# Patient Record
Sex: Male | Born: 1940
Health system: Southern US, Community
[De-identification: ages and names within clinical notes are randomized; demographics above are authoritative.]

## PROBLEM LIST (undated history)

## (undated) DIAGNOSIS — N390 Urinary tract infection, site not specified: Secondary | ICD-10-CM

## (undated) DIAGNOSIS — E669 Obesity, unspecified: Secondary | ICD-10-CM

## (undated) DIAGNOSIS — G20 Parkinson's disease: Secondary | ICD-10-CM

## (undated) DIAGNOSIS — G20A1 Parkinson's disease without dyskinesia, without mention of fluctuations: Secondary | ICD-10-CM

## (undated) DIAGNOSIS — E039 Hypothyroidism, unspecified: Secondary | ICD-10-CM

## (undated) DIAGNOSIS — I878 Other specified disorders of veins: Secondary | ICD-10-CM

## (undated) DIAGNOSIS — W19XXXA Unspecified fall, initial encounter: Secondary | ICD-10-CM

## (undated) HISTORY — DX: Hypothyroidism, unspecified: E03.9

## (undated) HISTORY — DX: Morbid (severe) obesity due to excess calories: E66.01

## (undated) HISTORY — PX: CERVICAL LAMINECTOMY: SHX94

## (undated) HISTORY — DX: Other specified disorders of veins: I87.8

## (undated) HISTORY — DX: Urinary tract infection, site not specified: N39.0

## (undated) HISTORY — PX: OTHER SURGICAL HISTORY: SHX169

## (undated) HISTORY — DX: Parkinson's disease: G20

## (undated) HISTORY — PX: APPENDECTOMY: SHX54

## (undated) HISTORY — DX: Obesity, unspecified: E66.9

## (undated) HISTORY — DX: Parkinson's disease without dyskinesia, without mention of fluctuations: G20.A1

---

## 1990-02-17 HISTORY — PX: FOOT SURGERY: SHX648

## 2002-01-21 ENCOUNTER — Ambulatory Visit (HOSPITAL_COMMUNITY): Admission: RE | Admit: 2002-01-21 | Discharge: 2002-01-21 | Payer: Self-pay | Admitting: Neurology

## 2002-01-21 ENCOUNTER — Encounter: Admission: RE | Admit: 2002-01-21 | Discharge: 2002-01-21 | Payer: Self-pay | Admitting: Neurology

## 2002-01-21 ENCOUNTER — Encounter: Payer: Self-pay | Admitting: Neurology

## 2002-02-03 ENCOUNTER — Encounter: Payer: Self-pay | Admitting: Neurology

## 2002-02-03 ENCOUNTER — Encounter: Admission: RE | Admit: 2002-02-03 | Discharge: 2002-02-03 | Payer: Self-pay | Admitting: Neurology

## 2004-02-20 ENCOUNTER — Encounter: Admission: RE | Admit: 2004-02-20 | Discharge: 2004-02-20 | Payer: Self-pay | Admitting: Sports Medicine

## 2004-03-11 ENCOUNTER — Encounter: Admission: RE | Admit: 2004-03-11 | Discharge: 2004-03-11 | Payer: Self-pay | Admitting: Sports Medicine

## 2004-03-28 ENCOUNTER — Encounter: Admission: RE | Admit: 2004-03-28 | Discharge: 2004-03-28 | Payer: Self-pay | Admitting: Sports Medicine

## 2004-12-14 ENCOUNTER — Inpatient Hospital Stay (HOSPITAL_COMMUNITY): Admission: EM | Admit: 2004-12-14 | Discharge: 2004-12-18 | Payer: Self-pay | Admitting: Emergency Medicine

## 2005-04-25 ENCOUNTER — Encounter: Admission: RE | Admit: 2005-04-25 | Discharge: 2005-04-25 | Payer: Self-pay | Admitting: Internal Medicine

## 2006-10-26 ENCOUNTER — Encounter: Admission: RE | Admit: 2006-10-26 | Discharge: 2006-10-26 | Payer: Self-pay | Admitting: Internal Medicine

## 2008-02-08 ENCOUNTER — Encounter: Admission: RE | Admit: 2008-02-08 | Discharge: 2008-02-08 | Payer: Self-pay | Admitting: Neurology

## 2008-11-06 ENCOUNTER — Encounter: Admission: RE | Admit: 2008-11-06 | Discharge: 2009-01-31 | Payer: Self-pay | Admitting: Neurology

## 2009-01-29 ENCOUNTER — Ambulatory Visit: Payer: Self-pay | Admitting: Family Medicine

## 2009-01-29 DIAGNOSIS — G20 Parkinson's disease: Secondary | ICD-10-CM

## 2009-05-03 ENCOUNTER — Ambulatory Visit: Payer: Self-pay | Admitting: Family Medicine

## 2009-05-03 DIAGNOSIS — H6120 Impacted cerumen, unspecified ear: Secondary | ICD-10-CM

## 2009-05-15 ENCOUNTER — Ambulatory Visit: Payer: Self-pay | Admitting: Family Medicine

## 2009-05-16 LAB — CONVERTED CEMR LAB
ALT: 7 units/L (ref 0–53)
Alkaline Phosphatase: 39 units/L (ref 39–117)
Basophils Relative: 0.5 % (ref 0.0–3.0)
Bilirubin, Direct: 0.1 mg/dL (ref 0.0–0.3)
Calcium: 8.9 mg/dL (ref 8.4–10.5)
Chloride: 103 meq/L (ref 96–112)
Creatinine, Ser: 0.9 mg/dL (ref 0.4–1.5)
Eosinophils Relative: 1.2 % (ref 0.0–5.0)
GFR calc non Af Amer: 89.11 mL/min (ref >60)
HDL: 44.3 mg/dL (ref >39.00)
LDL Cholesterol: 96 mg/dL (ref 0–99)
Lymphocytes Relative: 32.3 % (ref 12.0–46.0)
Monocytes Relative: 11 % (ref 3.0–12.0)
Neutrophils Relative %: 55 % (ref 43.0–77.0)
RBC: 4.62 M/uL (ref 4.22–5.81)
Total CHOL/HDL Ratio: 4
Total Protein: 7.3 g/dL (ref 6.0–8.3)
Triglycerides: 84 mg/dL (ref 0.0–149.0)
WBC: 5.6 10*3/uL (ref 4.5–10.5)

## 2009-05-28 ENCOUNTER — Encounter (INDEPENDENT_AMBULATORY_CARE_PROVIDER_SITE_OTHER): Payer: Self-pay | Admitting: *Deleted

## 2009-11-13 ENCOUNTER — Ambulatory Visit: Payer: Self-pay | Admitting: Family Medicine

## 2009-11-13 DIAGNOSIS — B356 Tinea cruris: Secondary | ICD-10-CM

## 2009-11-13 DIAGNOSIS — R300 Dysuria: Secondary | ICD-10-CM

## 2009-12-28 ENCOUNTER — Ambulatory Visit: Payer: Self-pay | Admitting: Family Medicine

## 2010-01-23 ENCOUNTER — Ambulatory Visit: Payer: Self-pay | Admitting: Family Medicine

## 2010-01-28 ENCOUNTER — Telehealth: Payer: Self-pay | Admitting: Family Medicine

## 2010-01-30 ENCOUNTER — Inpatient Hospital Stay (HOSPITAL_COMMUNITY)
Admission: AD | Admit: 2010-01-30 | Discharge: 2010-02-07 | Payer: Self-pay | Source: Home / Self Care | Attending: Internal Medicine | Admitting: Internal Medicine

## 2010-01-30 ENCOUNTER — Ambulatory Visit: Payer: Self-pay | Admitting: Family Medicine

## 2010-01-30 DIAGNOSIS — R5381 Other malaise: Secondary | ICD-10-CM | POA: Insufficient documentation

## 2010-01-30 DIAGNOSIS — R32 Unspecified urinary incontinence: Secondary | ICD-10-CM

## 2010-01-30 DIAGNOSIS — R5383 Other fatigue: Secondary | ICD-10-CM

## 2010-02-05 ENCOUNTER — Encounter
Admission: RE | Admit: 2010-02-05 | Discharge: 2010-02-05 | Payer: Self-pay | Source: Home / Self Care | Attending: Internal Medicine | Admitting: Internal Medicine

## 2010-02-14 ENCOUNTER — Encounter: Payer: Self-pay | Admitting: Family Medicine

## 2010-03-19 NOTE — Assessment & Plan Note (Signed)
Summary: UTI? // RS   Vital Signs:  Patient profile:   70 year old male Temp:     98.6 degrees F oral Pulse rate:   80 / minute Pulse rhythm:   regular Resp:     12 per minute BP sitting:   140 / 80  (left arm) Cuff size:   large  Vitals Entered By: Sid Falcon LPN (January 23, 2010 9:41 AM)  History of Present Illness: Patient is here with dysuria for the past few days. He states he is unable to give specimen. No history of incontinence. Some vague low back pain, urine frequency and burning with urination past couple days. No nausea or vomiting. Good fluid intake.  Patient has reportedly become less mobile over the past several days. Ambulates with walker. He has fairly advanced Parkinson's disease and according to family member has become less mobile over months. No recent falls.  Allergies (verified): No Known Drug Allergies  Past History:  Past Medical History: Last updated: 12/28/2009 UTI Parkinsons Disease Venous stasis edema.  Past Surgical History: Last updated: 05/15/2009 Cervical Laminectomy 1990 Rt foot repair 1992  Family History: Last updated: 01/29/2009 Family History of Alcoholism/Addiction Family History of Colon CA, father  Family History High cholesterol, mother Heart disease, mother, deceased age 31  Social History: Last updated: 01/29/2009 Retired Married Never Smoked Alcohol use-no Regular exercise-no  Risk Factors: Exercise: no (01/29/2009)  Risk Factors: Smoking Status: never (01/29/2009) PMH-FH-SH reviewed for relevance  Review of Systems      See HPI  Physical Exam  General:  patient is alert cooperative and in no distress Mouth:  Oral mucosa and oropharynx without lesions or exudates.  Teeth in good repair. Neck:  No deformities, masses, or tenderness noted. Lungs:  Normal respiratory effort, chest expands symmetrically. Lungs are clear to auscultation, no crackles or wheezes. Heart:  normal rate and regular rhythm.     Msk:  no CVA tenderness   Impression & Recommendations:  Problem # 1:  DYSURIA (ICD-788.1) possible UTI. Patient unable to give specimen. Start Cipro 500 mg b.i.d. for 7 days The following medications were removed from the medication list:    Cephalexin 500 Mg Caps (Cephalexin) ..... One by mouth three times a day for 10 days His updated medication list for this problem includes:    Ciprofloxacin Hcl 500 Mg Tabs (Ciprofloxacin hcl) ..... One by mouth two times a day for 7 days  Problem # 2:  PARKINSON'S DISEASE (ICD-332.0)  home health referral for assistance and evaluation for physical therapy and possible occupational therapy  Orders: Home Health Referral (Home Health)  Complete Medication List: 1)  Stalevo 100 25-100-200 Mg Tabs (Carbidopa-levodopa-entacapone) .... One tab six times a day 2)  Mens Multivitamin Plus Tabs (Multiple vitamins-minerals) .... Once daily 3)  Triamcinolone Acetonide 0.1 % Crea (Triamcinolone acetonide) .... Apply to affected area two times a day as needed 4)  Ciprofloxacin Hcl 500 Mg Tabs (Ciprofloxacin hcl) .... One by mouth two times a day for 7 days  Patient Instructions: 1)  Drink plenty of fluids up to 3-4 quarts a day. Cranberry juice is especially recommended in addition to large amounts of water. Avoid caffeine & carbonated drinks, they tend to irritate the bladder, Return in 3-5 days if you're not better: sooner if you're feeling worse.  Prescriptions: CIPROFLOXACIN HCL 500 MG TABS (CIPROFLOXACIN HCL) one by mouth two times a day for 7 days  #14 x 0   Entered and Authorized by:  Evelena Peat MD   Signed by:   Evelena Peat MD on 01/23/2010   Method used:   Electronically to        [id number]* (retail)       [address]       [place], [place]  [address]       Ph: [phone]       Fax: [phone]   RxID:   [id number]    Orders Added: 1)  Home Health Referral [facility]] 2)  Est. Patient Level III [84132]

## 2010-03-19 NOTE — Assessment & Plan Note (Signed)
Summary: ears clogged/njr   Vital Signs:  Patient profile:   70 year old male Temp:     97.7 degrees F oral BP sitting:   120 / 80  (left arm) Cuff size:   large  Vitals Entered By: Sid Falcon LPN (May 03, 2009 1:28 PM) CC: right ear plugged   History of Present Illness: Acute visit. Patient seen with one month history of right ear fullness. Decreased hearing. Denies any dizziness.   has not tried any home remedies for relief.  Left ear asymptomatic.  Parkinson's disease followed by neurologist.  No headaches, fever, vertigo.  Allergies (verified): No Known Drug Allergies  Past History:  Past Medical History: Last updated: 01/29/2009 UTI Parkinsons Disease  Review of Systems       The patient complains of decreased hearing.  The patient denies fever.    Physical Exam  General:  Well-developed,well-nourished,in no acute distress; alert,appropriate and cooperative throughout examination Ears:  right ear canal is impacted with cerumen. Left reveals minimal cerumen. Oropharynx clear Neck:  No deformities, masses, or tenderness noted. Lungs:  Normal respiratory effort, chest expands symmetrically. Lungs are clear to auscultation, no crackles or wheezes. Heart:  normal rate and regular rhythm.     Impression & Recommendations:  Problem # 1:  CERUMEN IMPACTION (ICD-380.4) removed by irrigation.  TM shows no acute changes.  Problem # 2:  Preventive Health Care (ICD-V70.0) pt encouraged to schedule CPE as he has not had in several years.  Complete Medication List: 1)  Stalevo 100 25-100-200 Mg Tabs (Carbidopa-levodopa-entacapone) .... One tab six times a day 2)  Mens Multivitamin Plus Tabs (Multiple vitamins-minerals) .... Once daily

## 2010-03-19 NOTE — Assessment & Plan Note (Signed)
Summary: emp-will fast//ccm   Vital Signs:  Patient profile:   70 year old male Height:      73 inches Weight:      330 pounds Temp:     97.8 degrees F oral Pulse rate:   60 / minute Pulse rhythm:   regular Resp:     12 per minute BP sitting:   130 / 80  (left arm) Cuff size:   large  Vitals Entered By: Sid Falcon LPN (May 15, 2009 8:55 AM) CC: CPX, pt fasting   History of Present Illness: Here for CPE.  Hx Parkinson's Disease followed closely by neurology.  ?PVX 2006.  dT 2010.  No hx of colonoscopy.  Pos FH colon cancer father. no hx shingles vaccine. no recent labs.  Reported episode syncope last year with thorough w/u by neurology and cardiology with what sounds like ECHO, carotid dopplers, and stress test all unremarkable.  No further syncope since then.  No recent falls.  PMH, SH, FH reviewed.  Allergies (verified): No Known Drug Allergies  Past History:  Past Medical History: Last updated: 01/29/2009 UTI Parkinsons Disease  Family History: Last updated: 01/29/2009 Family History of Alcoholism/Addiction Family History of Colon CA, father  Family History High cholesterol, mother Heart disease, mother, deceased age 21  Social History: Last updated: 01/29/2009 Retired Married Never Smoked Alcohol use-no Regular exercise-no  Risk Factors: Exercise: no (01/29/2009)  Risk Factors: Smoking Status: never (01/29/2009)  Past Surgical History: Cervical Laminectomy 1990 Rt foot repair 1992 PMH-FH-SH reviewed for relevance  Review of Systems  The patient denies anorexia, fever, weight loss, weight gain, hoarseness, chest pain, syncope, dyspnea on exertion, peripheral edema, prolonged cough, headaches, hemoptysis, abdominal pain, melena, hematochezia, severe indigestion/heartburn, hematuria, incontinence, muscle weakness, depression, and enlarged lymph nodes.    Physical Exam  General:  Obese, nad Head:  Normocephalic and atraumatic without  obvious abnormalities. No apparent alopecia or balding. Eyes:  pupils equal, pupils round, and pupils reactive to light.   Ears:  External ear exam shows no significant lesions or deformities.  Otoscopic examination reveals clear canals, tympanic membranes are intact bilaterally without bulging, retraction, inflammation or discharge. Hearing is grossly normal bilaterally. Mouth:  Oral mucosa and oropharynx without lesions or exudates.  Teeth in good repair. Neck:  No deformities, masses, or tenderness noted. no bruits Lungs:  Normal respiratory effort, chest expands symmetrically. Lungs are clear to auscultation, no crackles or wheezes. Heart:  normal rate, regular rhythm, and no gallop.   Abdomen:  Bowel sounds positive,abdomen soft and non-tender without masses, organomegaly or hernias noted. Rectal:  No external abnormalities noted. Normal sphincter tone. No rectal masses or tenderness. Prostate:  Prostate gland firm and smooth, no enlargement, nodularity, tenderness, mass, asymmetry or induration. Extremities:  no pitting edema and no clubbing. Neurologic:  Mild rigidity.  Masked facies. Mild tremor at rest. Skin:  Intact without suspicious lesions or rashes Cervical Nodes:  No lymphadenopathy noted Psych:  good eye contact, not anxious appearing, and not depressed appearing.     Impression & Recommendations:  Problem # 1:  Preventive Health Care (ICD-V70.0) PVX booster.  Screening labs.  Long discussion regarding other screenings such as colonoscopy and he wishes to pursue.  Work on weight loss.  Exercise somewhat limited by Parkinson's.  Info given on Zostavax.  Complete Medication List: 1)  Stalevo 100 25-100-200 Mg Tabs (Carbidopa-levodopa-entacapone) .... One tab six times a day 2)  Mens Multivitamin Plus Tabs (Multiple vitamins-minerals) .... Once daily  Other Orders:  Venipuncture [phone]) Gastroenterology Referral (GI) Pneumococcal Vaccine (60454) Admin 1st Vaccine  (09811) TLB-Lipid Panel (80061-LIPID) TLB-BMP (Basic Metabolic Panel-BMET) (80048-METABOL) TLB-CBC Platelet - w/Differential (85025-CBCD) TLB-Hepatic/Liver Function Pnl (80076-HEPATIC) TLB-TSH (Thyroid Stimulating Hormone) (84443-TSH) TLB-PSA (Prostate Specific Antigen) (84153-PSA)  Patient Instructions: 1)  You need to lose weight. Consider a lower calorie diet and regular exercise.  2)  Consider Shingles Vaccine at some point this year.    Immunizations Administered:  Pneumonia Vaccine:    Vaccine Type: Pneumovax    Site: left deltoid    Mfr: Merck    Dose: 0.5 ml    Route: IM    Given by: Sid Falcon LPN    Exp. Date: 01/27/2010    Lot #: [id number]

## 2010-03-19 NOTE — Assessment & Plan Note (Signed)
Summary: UTI? // RS   Vital Signs:  Patient profile:   70 year old male Temp:     98.1 degrees F oral BP sitting:   132 / 72  (left arm) Cuff size:   large  Vitals Entered By: Sid Falcon LPN (November 13, 2009 11:31 AM)   History of Present Illness: Patient seen 3 day history of burning with urination and urinary frequency. Similar symptoms in past with UTI. Felt somewhat feverish couple days ago but none today. No chills. No nausea, vomiting, or back pain.  Patient also complains of intermittent yeast infection groin region. Exacerbated by obesity.  Patient reportedly spilled urine during collection. He is unable to give another specimen at this time.  Allergies (verified): No Known Drug Allergies  Past History:  Past Medical History: Last updated: 01/29/2009 UTI Parkinsons Disease  Review of Systems  The patient denies fever, abdominal pain, and incontinence.    Physical Exam  General:  morbidly obese pleasant 70 year old male in no distress Mouth:  Oral mucosa and oropharynx without lesions or exudates.  Teeth in good repair. Lungs:  Normal respiratory effort, chest expands symmetrically. Lungs are clear to auscultation, no crackles or wheezes. Heart:  Normal rate and regular rhythm. S1 and S2 normal without gallop, murmur, click, rub or other extra sounds. Msk:  no CVA tenderness   Impression & Recommendations:  Problem # 1:  DYSURIA (ICD-788.1) ?UTI,  As per HPI, pt unable to give urine sample. His updated medication list for this problem includes:    Ciprofloxacin Hcl 500 Mg Tabs (Ciprofloxacin hcl) ..... One by mouth two times a day for 7 days  Problem # 2:  TINEA CRURIS (ICD-110.3) nystatin cream.  Complete Medication List: 1)  Stalevo 100 25-100-200 Mg Tabs (Carbidopa-levodopa-entacapone) .... One tab six times a day 2)  Mens Multivitamin Plus Tabs (Multiple vitamins-minerals) .... Once daily 3)  Ciprofloxacin Hcl 500 Mg Tabs (Ciprofloxacin hcl)  .... One by mouth two times a day for 7 days 4)  Nystatin 100000 Unit/gm Crea (Nystatin) .... Apply to affected area two times a day as needed  Patient Instructions: 1)  Drink plenty of fluids up to 3-4 quarts a day. Cranberry juice is especially recommended in addition to large amounts of water. Avoid caffeine & carbonated drinks, they tend to irritate the bladder, Return in 3-5 days if you're not better: sooner if you're feeling worse.  2)  Please schedule a follow-up appointment in 1 month.  Prescriptions: NYSTATIN 100000 UNIT/GM CREA (NYSTATIN) apply to affected area two times a day as needed  #30 gm x 1   Entered and Authorized by:   Evelena Peat MD   Signed by:   Evelena Peat MD on 11/13/2009   Method used:   Electronically to        [facility]  [phone]* (retail)       [address]       [place], [place]  [address]       Ph: [phone] or [phone]       Fax: [phone]   RxID:   [id number] CIPROFLOXACIN HCL 500 MG TABS (CIPROFLOXACIN HCL) one by mouth two times a day for 7 days  #14 x 0   Entered and Authorized by:   Evelena Peat MD   Signed by:   Evelena Peat MD on 11/13/2009   Method used:   Electronically to        [facility]  [address]  [phone]* (retail)       [address]       [place], [place]  [address]       Ph: [phone] or [phone]       Fax: [phone]   RxID:   [id number]

## 2010-03-19 NOTE — Letter (Signed)
Summary: Pre Visit No Show Letter  [facility]  [address] [place], [place] [address]   Phone: [phone]  Fax: [phone]        May 28, 2009 MRN: [id number]    [name] [address] [place], [place]  [address]    Dear Mr. STHILAIRE,   We have been unable to reach you by phone concerning the pre-procedure visit that you missed on April 11,2001 . For this reason,your procedure scheduled on April 25,2011 has been cancelled. Our scheduling staff will gladly assist you with rescheduling your appointments at a more convenient time. Please call our office at [phone] between the hours of 8:00am and 5:00pm, press option #2 to reach an appointment scheduler. Please consider updating your contact numbers at this time so that we can reach you by phone in the future with schedule changes or results.    Thank you,    Sherren Kerns RN [facility]

## 2010-03-19 NOTE — Assessment & Plan Note (Signed)
Summary: ?cellulitis/flu shot/njr   Vital Signs:  Patient profile:   70 year old male Weight:      330 pounds Temp:     97.7 degrees F oral BP sitting:   120 / 80  (left arm) Cuff size:   large  Vitals Entered By: Sid Falcon LPN (December 28, 2009 11:44 AM)  History of Present Illness: Pt has Parkinsons Disease and morbid obesity. Here with increased edema and redness L leg > R. Recliner recently broke and he has been sleeping with legs dependent. No fever.  Does not use support hose.  no increase in dyspnea.  Allergies (verified): No Known Drug Allergies  Past History:  Past Surgical History: Last updated: 05/15/2009 Cervical Laminectomy 1990 Rt foot repair 1992  Family History: Last updated: 01/29/2009 Family History of Alcoholism/Addiction Family History of Colon CA, father  Family History High cholesterol, mother Heart disease, mother, deceased age 5  Social History: Last updated: 01/29/2009 Retired Married Never Smoked Alcohol use-no Regular exercise-no  Risk Factors: Exercise: no (01/29/2009)  Risk Factors: Smoking Status: never (01/29/2009)  Past Medical History: UTI Parkinsons Disease Venous stasis edema.  Review of Systems       The patient complains of peripheral edema.  The patient denies fever, weight loss, chest pain, dyspnea on exertion, and prolonged cough.    Physical Exam  General:  Well-developed,well-nourished,in no acute distress; alert,appropriate and cooperative throughout examination Lungs:  Normal respiratory effort, chest expands symmetrically. Lungs are clear to auscultation, no crackles or wheezes. Heart:  normal rate and regular rhythm.   Extremities:  edema both legs L > R.  No calf tenderness.  Mild diffuse erythema LLE  with mild warmth but nontender.  Good cap refill.  No ulcerations.   Impression & Recommendations:  Problem # 1:  CELLULITIS, LEG, RIGHT (ICD-682.6)  ?cellulitis LLE vs venous stasis dermatitis.   Elevation, compression hose, Cephalexin and topical triamcinolone cream. The following medications were removed from the medication list:    Ciprofloxacin Hcl 500 Mg Tabs (Ciprofloxacin hcl) ..... One by mouth two times a day for 7 days His updated medication list for this problem includes:    Cephalexin 500 Mg Caps (Cephalexin) ..... One by mouth three times a day for 10 days  Orders: Prescription Created Electronically [phone])  Complete Medication List: 1)  Stalevo 100 25-100-200 Mg Tabs (Carbidopa-levodopa-entacapone) .... One tab six times a day 2)  Mens Multivitamin Plus Tabs (Multiple vitamins-minerals) .... Once daily 3)  Triamcinolone Acetonide 0.1 % Crea (Triamcinolone acetonide) .... Apply to affected area two times a day as needed 4)  Cephalexin 500 Mg Caps (Cephalexin) .... One by mouth three times a day for 10 days  Other Orders: Flu Vaccine 52yrs + MEDICARE PATIENTS (M5784) Administration Flu vaccine - MCR (O9629)  Patient Instructions: 1)  Elevate legs frequently 2)  Use compression garments for legs daily. Prescriptions: CEPHALEXIN 500 MG CAPS (CEPHALEXIN) one by mouth three times a day for 10 days  #30 x 0   Entered and Authorized by:   Evelena Peat MD   Signed by:   Evelena Peat MD on 12/28/2009   Method used:   Electronically to        [facility] (retail)       [address]       [place], [place]  [address]       Ph: [phone]       Fax: [phone]   RxID:   [id number] TRIAMCINOLONE ACETONIDE 0.1 % CREA (TRIAMCINOLONE  ACETONIDE) apply to affected area two times a day as needed  #30 x 0   Entered and Authorized by:   Evelena Peat MD   Signed by:   Evelena Peat MD on 12/28/2009   Method used:   Electronically to        [facility]  [phone]* (retail)       [address]       [place], [place]  [address]       Ph: [phone] or [phone]       Fax: [phone]   RxID:   [id number]    Orders  Added: 1)  Flu Vaccine 42yrs + MEDICARE PATIENTS [Q2039] 2)  Administration Flu vaccine - MCR [G0008] 3)  Est. Patient Level III [36644] 4)  Prescription Created Electronically [I3474]   Flu Vaccine Consent Questions     Do you have a history of severe allergic reactions to this vaccine? no    Any prior history of allergic reactions to egg and/or gelatin? no    Do you have a sensitivity to the preservative Thimersol? no    Do you have a past history of Guillan-Barre Syndrome? no    Do you currently have an acute febrile illness? no    Have you ever had a severe reaction to latex? no    Vaccine information given and explained to patient? yes    Are you currently pregnant? no    Lot Number:[id number]   Exp Date:08/17/2010   Site Given  Left Deltoid IM         .[email or web address]

## 2010-03-21 NOTE — Miscellaneous (Signed)
Summary: Physician Communication/[facility]  Physician Communication/[facility]   Imported By: Maryln Gottron 02/20/2010 15:03:11  _____________________________________________________________________  External Attachment:    Type:   Image     Comment:   External Document

## 2010-03-21 NOTE — Assessment & Plan Note (Signed)
Summary: not any better//ccm   Vital Signs:  Patient profile:   70 year old male Temp:     98.2 degrees F oral BP sitting:   110 / 78  (left arm) Cuff size:   large  Vitals Entered By: Sid Falcon LPN (January 30, 2010 2:52 PM)  History of Present Illness: Patient has Parkinson's disease and morbid obesity. Was seen last week with foul-smelling urine and some urine incontinence. Was unable to give urine specimen. Was treated empirically for possible UTI with Cipro. He is continued to have foul smelling urine and some progressive urine incontinence over the past week. Possible low grade fever over the weekend.  Patient also has inability to ambulate at this time. Had been ambulating with walker. Urine incontinence is a relatively new symptom. Denies any nausea, vomiting, or fever this time. He had some generalized weakness but no focal weakness.  His wife has had increased difficulty caring for him over the past week.  Allergies (verified): No Known Drug Allergies  Past History:  Past Medical History: Last updated: 12/28/2009 UTI Parkinsons Disease Venous stasis edema.  Past Surgical History: Last updated: 05/15/2009 Cervical Laminectomy 1990 Rt foot repair 1992  Family History: Last updated: 01/29/2009 Family History of Alcoholism/Addiction Family History of Colon CA, father  Family History High cholesterol, mother Heart disease, mother, deceased age 20  Social History: Last updated: 01/29/2009 Retired Married Never Smoked Alcohol use-no Regular exercise-no  Risk Factors: Exercise: no (01/29/2009)  Risk Factors: Smoking Status: never (01/29/2009) PMH-FH-SH reviewed for relevance  Review of Systems       The patient complains of peripheral edema.  The patient denies anorexia, fever, chest pain, syncope, dyspnea on exertion, prolonged cough, abdominal pain, melena, hematochezia, and severe indigestion/heartburn.    Physical Exam  General:  alert pleasant  obese gentleman in no distress Mouth:  Oral mucosa and oropharynx without lesions or exudates.  Teeth in good repair. Neck:  No deformities, masses, or tenderness noted. Lungs:  Normal respiratory effort, chest expands symmetrically. Lungs are clear to auscultation, no crackles or wheezes. Heart:  normal rate and regular rhythm.   Abdomen:  soft and non-tender.   Msk:  no CVA tenderness Extremities:  patient has some bilateral lower extremity edema which is chronic Neurologic:  alert & oriented X3 and cranial nerves II-XII intact.  grip strengths are 5+ bilaterally. 5+ strength with plantar flexion dorsiflexion. 4+ strength with hip flexion bilaterally and knee extension. Patient unable to ambulate.  Cannot elicit knee or ankle reflexes bilaterally Psych:  normally interactive and good eye contact.     Impression & Recommendations:  Problem # 1:  URINARY INCONTINENCE (ICD-788.30) ?related to neurogenic bladder.  PT unable to give urine specimen. Cannot rule out infection.  Problem # 2:  PARKINSON'S DISEASE (ICD-332.0)  Problem # 3:  DYSURIA (ICD-788.1)  His updated medication list for this problem includes:    Ciprofloxacin Hcl 500 Mg Tabs (Ciprofloxacin hcl) ..... One by mouth two times a day for 7 days  Problem # 4:  WEAKNESS (ICD-780.79) progressive weakness and inability to ambulate.  Family requests hospitalization and possible rehab assessment if appropriate. ?related to infection, Parkinsons, vs other.  Complete Medication List: 1)  Stalevo 100 25-100-200 Mg Tabs (Carbidopa-levodopa-entacapone) .... One tab six times a day 2)  Mens Multivitamin Plus Tabs (Multiple vitamins-minerals) .... Once daily 3)  Triamcinolone Acetonide 0.1 % Crea (Triamcinolone acetonide) .... Apply to affected area two times a day as needed 4)  Ciprofloxacin Hcl 500  Mg Tabs (Ciprofloxacin hcl) .... One by mouth two times a day for 7 days 5)  Diflucan 150 Mg Tabs (Fluconazole) .... One by mouth  now   Orders Added: 1)  Est. Patient Level IV [91478]

## 2010-03-21 NOTE — Progress Notes (Signed)
Summary: wants stronger abx  Phone Note Call from Patient Call back at Home Phone [phone]   Caller: Patient---triage vm Summary of Call: was seen a week ago with uti. was rx'd meds. not helping wants something stronger than Cipro.       [facility]. Initial call taken by: Warnell Forester,  January 28, 2010 8:48 AM  Follow-up for Phone Call        pt called again. has a yeast infection as well. wants Diflucan. Follow-up by: Warnell Forester,  January 28, 2010 9:27 AM  Additional Follow-up for Phone Call Additional follow up Details #1::        [facility]---[phone] Additional Follow-up by: Warnell Forester,  January 28, 2010 11:18 AM    Additional Follow-up for Phone Call Additional follow up Details #2::    There is really nothing "stronger" than Cipro for UTI.  Could have resistence to ANY antibiotic. He was unable to given specimen.  If persistent symptoms we need to get urine sample for cx. OK to call in Diflucan 150 mg by mouth times one dose. Follow-up by: Evelena Peat MD,  January 28, 2010 1:09 PM  Additional Follow-up for Phone Call Additional follow up Details #3:: Details for Additional Follow-up Action Taken: pt called again.  he says that he feels terrible. Additional Follow-up by: Warnell Forester,  January 28, 2010 2:30 PM  New/Updated Medications: DIFLUCAN 150 MG TABS (FLUCONAZOLE) one by mouth now Prescriptions: DIFLUCAN 150 MG TABS (FLUCONAZOLE) one by mouth now  #1 x 0   Entered by:   Lynann Beaver CMA AAMA   Authorized by:   Evelena Peat MD   Signed by:   Lynann Beaver CMA AAMA on 01/28/2010   Method used:   Electronically to        [facility]  [phone]* (retail)       [address]       [place], [place]  [address]       Ph: [phone] or [phone]       Fax: [phone]   RxID:   [id number]  Family notified.

## 2010-03-22 ENCOUNTER — Encounter: Payer: Self-pay | Admitting: Family Medicine

## 2010-03-25 ENCOUNTER — Telehealth: Payer: Self-pay | Admitting: *Deleted

## 2010-03-25 NOTE — Telephone Encounter (Signed)
Verbal order given to [facility] 

## 2010-03-25 NOTE — Telephone Encounter (Signed)
OK to give verbal for 5 inch wheels

## 2010-03-25 NOTE — Telephone Encounter (Signed)
Needs verbal order for walker with 5 inch wheels.

## 2010-03-26 ENCOUNTER — Ambulatory Visit (INDEPENDENT_AMBULATORY_CARE_PROVIDER_SITE_OTHER): Payer: Medicare Other | Admitting: Family Medicine

## 2010-03-26 ENCOUNTER — Encounter: Payer: Self-pay | Admitting: Family Medicine

## 2010-03-26 DIAGNOSIS — G20 Parkinson's disease: Secondary | ICD-10-CM

## 2010-03-26 DIAGNOSIS — G20A1 Parkinson's disease without dyskinesia, without mention of fluctuations: Secondary | ICD-10-CM

## 2010-03-26 DIAGNOSIS — R32 Unspecified urinary incontinence: Secondary | ICD-10-CM

## 2010-03-26 DIAGNOSIS — R5381 Other malaise: Secondary | ICD-10-CM

## 2010-03-26 DIAGNOSIS — R5383 Other fatigue: Secondary | ICD-10-CM

## 2010-03-26 NOTE — Progress Notes (Signed)
  Subjective:    Patient ID: [name], male    DOB: [birth year], 70 y.o.   MRN: [id number]  HPI History of Parkinson's disease and recurrent urinary tract infections. Presented here December 14 with profound weakness. He was admitted to hospital with inability to ambulate. Workup included MRI scan of the lumbar and cervical spine. Significant for degenerative changes at several levels. Neurosurgery consult with no recommendation for surgery. Chest x-ray unremarkable. MRI of brain no acute abnormality. Patient also seen in consultation by neurology. Addition of Requip 0.25 mg twice daily to his other Parkinson's medication.  Overall greatly improved at this time.  Patient did not have evidence for infectious problem. Blood cultures and urine culture negative. Underwent extensive rehabilitation. Discharged to [facility] for one month of rehabilitation. He is now back home ambulating with walker. Great improvement overall.  Has lost some weight due to his efforts. Overall great improvement in strength. Only one recent fall with mild right knee abrasion. No head injury.   Review of Systems    patient denies any headache, visual change, sore throat, fever, chills, chest pain, chronic cough, dyspnea, abdominal pain, burning with urination, stool change, cognitive impairment. Objective:   Physical Exam Patient is alert in no distress. Head is atraumatic. Pupils equal reactive to light Oropharynx clear Neck supple no mass Chest clear to auscultation Heart regular rhythm and rate with no murmur Extremities small abrasion right knee. Full range of motion. Minimal trace edema lower legs bilaterally Neurologic exam he has tremor at rest which is mild. No focal strength deficits. Cranial nerves II through XII are normal       Assessment & Plan:  #1  overall weakness with no specific etiology found. Probably related to deconditioning. Greatly improved #2  Parkinson's disease #3  history of  recurrent UTI #4  morbid obesity  Continue weight loss efforts. Continue home physical therapy. No medication changes made

## 2010-03-26 NOTE — Patient Instructions (Signed)
Follow up sooner for any progressive weakness or any urinary symptoms.

## 2010-03-28 ENCOUNTER — Telehealth: Payer: Self-pay | Admitting: *Deleted

## 2010-03-28 DIAGNOSIS — B372 Candidiasis of skin and nail: Secondary | ICD-10-CM

## 2010-03-28 NOTE — Telephone Encounter (Signed)
OK to start Diflucan 100 mg po qd for 7 days.

## 2010-03-28 NOTE — Telephone Encounter (Signed)
Pt states he was in to see Dr. Caryl Never this week, and has a yeast infection, and wife would like him to have Diflucan.

## 2010-03-28 NOTE — Telephone Encounter (Signed)
Need more specific info.  What type of yeast infection-location, etc?

## 2010-03-28 NOTE — Telephone Encounter (Signed)
Pt states the yeast is on his lower abdomen and crotch.  Penis or scrotum not involved.  Wife is nurse and is sure it is yeast?

## 2010-03-29 MED ORDER — FLUCONAZOLE 100 MG PO TABS: 100.0000 mg | ORAL_TABLET | Freq: Every day | ORAL | Status: AC

## 2010-03-29 MED ORDER — FLUCONAZOLE 100 MG PO TABS: 100.0000 mg | ORAL_TABLET | Freq: Every day | ORAL | Status: DC

## 2010-03-29 NOTE — Telephone Encounter (Signed)
Notified pt to pick up meds.

## 2010-04-05 ENCOUNTER — Telehealth: Payer: Self-pay | Admitting: Family Medicine

## 2010-04-05 NOTE — Telephone Encounter (Signed)
lmoam for [facility] for 1 week per Dr Caryl Never,

## 2010-04-05 NOTE — Telephone Encounter (Signed)
Triage vm-----requesting a verbal order to extend 1 more week of home physical therapy, for gait training. He is doing well and will be transitioning to the [facility] program here in town.

## 2010-04-05 NOTE — Telephone Encounter (Signed)
OK to extend

## 2010-04-17 ENCOUNTER — Telehealth: Payer: Self-pay | Admitting: Family Medicine

## 2010-04-17 NOTE — Telephone Encounter (Signed)
I'm not aware of any.

## 2010-04-17 NOTE — Telephone Encounter (Signed)
Triage vm------just  Completed some certifications. Are there any additional orders or treatment plans that need to be certified? Please return call or fax.

## 2010-04-19 ENCOUNTER — Telehealth: Payer: Self-pay | Admitting: Family Medicine

## 2010-04-19 MED ORDER — LEVOTHYROXINE SODIUM 25 MCG PO TABS: 25.0000 ug | ORAL_TABLET | Freq: Every day | ORAL | Status: DC

## 2010-04-19 NOTE — Telephone Encounter (Signed)
Pt calling back to see if there is any thing he needs to do in regards to rx from Dr. Eula Listen that he spoke to the nurse about yesterday.

## 2010-04-23 NOTE — Telephone Encounter (Signed)
Left a VM on pt personally identified  Phone to call our office back if we have not answered all his questions, med was filled. No other forms received per Dr Caryl Never

## 2010-04-29 LAB — COMPREHENSIVE METABOLIC PANEL
Alkaline Phosphatase: 43 U/L (ref 39–117)
BUN: 22 mg/dL (ref 6–23)
Chloride: 102 mEq/L (ref 96–112)
Creatinine, Ser: 1.35 mg/dL (ref 0.4–1.5)
Glucose, Bld: 95 mg/dL (ref 70–99)
Potassium: 4 mEq/L (ref 3.5–5.1)
Total Bilirubin: 0.8 mg/dL (ref 0.3–1.2)

## 2010-04-29 LAB — BASIC METABOLIC PANEL
BUN: 15 mg/dL (ref 6–23)
Calcium: 8.7 mg/dL (ref 8.4–10.5)
Calcium: 9.3 mg/dL (ref 8.4–10.5)
Chloride: 103 mEq/L (ref 96–112)
Creatinine, Ser: 0.93 mg/dL (ref 0.4–1.5)
Creatinine, Ser: 1.08 mg/dL (ref 0.4–1.5)
GFR calc Af Amer: >60 mL/min (ref >60)
GFR calc Af Amer: >60 mL/min (ref >60)
GFR calc non Af Amer: >60 mL/min (ref >60)
GFR calc non Af Amer: >60 mL/min (ref >60)
GFR calc non Af Amer: >60 mL/min (ref >60)
Glucose, Bld: 103 mg/dL — ABNORMAL HIGH (ref 70–99)
Glucose, Bld: 92 mg/dL (ref 70–99)
Potassium: 4.1 mEq/L (ref 3.5–5.1)
Sodium: 135 mEq/L (ref 135–145)
Sodium: 137 mEq/L (ref 135–145)
Sodium: 138 mEq/L (ref 135–145)

## 2010-04-29 LAB — URINALYSIS, ROUTINE W REFLEX MICROSCOPIC
Glucose, UA: NEGATIVE mg/dL
Ketones, ur: NEGATIVE mg/dL
Nitrite: NEGATIVE
Specific Gravity, Urine: 1.015 (ref 1.005–1.030)
pH: 5 (ref 5.0–8.0)

## 2010-04-29 LAB — CBC
HCT: 33.7 % — ABNORMAL LOW (ref 39.0–52.0)
HCT: 34.6 % — ABNORMAL LOW (ref 39.0–52.0)
HCT: 37.2 % — ABNORMAL LOW (ref 39.0–52.0)
Hemoglobin: 11.1 g/dL — ABNORMAL LOW (ref 13.0–17.0)
Hemoglobin: 11.1 g/dL — ABNORMAL LOW (ref 13.0–17.0)
MCH: 28.4 pg (ref 26.0–34.0)
MCH: 28.6 pg (ref 26.0–34.0)
MCV: 85.4 fL (ref 78.0–100.0)
MCV: 86.2 fL (ref 78.0–100.0)
MCV: 86.5 fL (ref 78.0–100.0)
Platelets: 206 10*3/uL (ref 150–400)
Platelets: 241 10*3/uL (ref 150–400)
RBC: 3.91 MIL/uL — ABNORMAL LOW (ref 4.22–5.81)
RBC: 4.05 MIL/uL — ABNORMAL LOW (ref 4.22–5.81)
RDW: 12.7 % (ref 11.5–15.5)
RDW: 12.8 % (ref 11.5–15.5)
RDW: 12.9 % (ref 11.5–15.5)
WBC: 6.4 10*3/uL (ref 4.0–10.5)
WBC: 6.6 10*3/uL (ref 4.0–10.5)
WBC: 7.8 10*3/uL (ref 4.0–10.5)
WBC: 9.2 10*3/uL (ref 4.0–10.5)

## 2010-04-29 LAB — RHEUMATOID FACTOR: Rhuematoid fact SerPl-aCnc: <20 IU/mL (ref 0–20)

## 2010-04-29 LAB — CULTURE, BLOOD (ROUTINE X 2)
Culture  Setup Time: 201112150215
Culture: NO GROWTH

## 2010-04-29 LAB — DIFFERENTIAL
Basophils Absolute: 0 10*3/uL (ref 0.0–0.1)
Basophils Relative: 0 % (ref 0–1)
Neutro Abs: 6.2 10*3/uL (ref 1.7–7.7)
Neutrophils Relative %: 67 % (ref 43–77)

## 2010-04-29 LAB — RENAL FUNCTION PANEL
Albumin: 2.4 g/dL — ABNORMAL LOW (ref 3.5–5.2)
CO2: 25 mEq/L (ref 19–32)
Chloride: 104 mEq/L (ref 96–112)
GFR calc Af Amer: >60 mL/min (ref >60)
GFR calc non Af Amer: >60 mL/min (ref >60)
Potassium: 3.9 mEq/L (ref 3.5–5.1)

## 2010-04-29 LAB — METHYLMALONIC ACID, SERUM: Methylmalonic Acid, Quantitative: 217 nmol/L (ref 87–318)

## 2010-04-29 LAB — TSH: TSH: 2.804 u[IU]/mL (ref 0.350–4.500)

## 2010-04-29 LAB — URINE CULTURE: Culture  Setup Time: 201112141812

## 2010-04-29 LAB — SEDIMENTATION RATE: Sed Rate: 63 mm/hr — ABNORMAL HIGH (ref 0–16)

## 2010-04-29 LAB — VITAMIN B12: Vitamin B-12: 251 pg/mL (ref 211–911)

## 2010-04-29 LAB — C-REACTIVE PROTEIN: CRP: 12.2 mg/dL — ABNORMAL HIGH (ref <0.6)

## 2010-04-29 LAB — PHOSPHORUS: Phosphorus: 3.4 mg/dL (ref 2.3–4.6)

## 2010-04-29 LAB — CK: Total CK: 75 U/L (ref 7–232)

## 2010-07-05 NOTE — Discharge Summary (Signed)
NAME:  [name], [name] NO.:  [id number]   MEDICAL RECORD NO.:  [id number]          PATIENT TYPE:  INP   LOCATION:  4712                         FACILITY:  [facility]   PHYSICIAN:  Deirdre Peer. Polite, M.D. DATE OF BIRTH:  September 20, 1940   DATE OF ADMISSION:  12/14/2004  DATE OF DISCHARGE:                                 DISCHARGE SUMMARY   DISCHARGE DIAGNOSES:  1.  Left lower lobe pneumonia.  2.  History of Parkinson's. No sign of aspiration via swallowing study.  3.  Abnormal cardiac enzymes with norma indices for outpatient evaluation by      [facility].  4.  Morbid obesity.   DISCHARGE MEDICATIONS:  1.  Avelox 400 mg daily.  2.  Ventolin MDI two puffs q.6h.  3.  Humibid 600 mg q.12h.  4.  The patient is asked to resume other home medications which include      Permax 1 mg b.i.d., __________ 800 mg b.i.d., Eldepryl 5 mg p.o. b.i.d.,      ProMax 0.5 mg q.h.s., Stalevo 50 mg six times daily, Prevacid daily.   STUDIES:  The patient had a chest x-ray that shows lung suboptimally  inflated. Findings concerning for left lower lobe infiltrate. The patient  had a swallowing evaluation without signs of aspiration. The patient had  sputum cultures with results pending at the time of this dictation. The  patient had a blood culture which showed Strep pneumonia. EKG without acute  abnormalities.   HISTORY OF PRESENT ILLNESS:  This is a 70 year old male who presented to the  hospital for evaluation after failing outpatient treatment for pneumonia. In  the ED the patient was evaluated. He had an abnormal x-ray showing left  lower lobe infiltrate. Also was found to be febrile and with significant  leukocytosis. Admission was deemed necessary for further evaluation and  treatment of pneumonia. Please see dictated H&P for further details.   PAST MEDICAL HISTORY:  As stated above.   MEDICATIONS:  On admission as stated above.   SOCIAL HISTORY:  Positive  for two to three cigarettes a week. Recovering  alcohol abuse.   ALLERGIES:  None.   FAMILY HISTORY:  Mother deceased secondary to MI. Father deceased secondary  to history of cancer.   DATA REVIEWED:  Sputum culture revealed rare gram positive cocci in pairs.  CBC at discharge revealed white count within normal limits at 8.2,  hemoglobin 11.9. Troponin-I 0.23 and follow-up 0.17.  CK 477 and follow-up  341. MB 5.9 with follow-up 4.5. Index normal.   HOSPITAL COURSE:  The patient was admitted to the stepdown unit for  evaluation and treatment of pneumonia. The patient was treated with  antibiotics, O2, and nebs. The patient was treated with multiple antibiotics  initially which including Zosyn, vancomycin, as well as azithromycin. The  patient had other screening tests as well as cardiac enzymes which were  minimally abnormal. The patient's EKG was without ischemia. Because of  abnormal cardiac enzymes, cardiology was called and the patient's index was  negative. I was unsure of the significance  of the patient's elevated  troponin. It was felt that it may be secondary to the patient's pneumonic  process. It is felt that the patient would be safe for outpatient follow-up  through further risk stratification. The patient was continued on the  monitor throughout this hospitalization without any arrhythmia. The patient  was treated for his pneumonia which improved dramatically. The patient's  blood cultures finally came back consistent with strep pneumonia. The  patient's antibiotics were stream line to Avelox. The patient will continue  another ten days of Avelox and other medications as stated above. The  patient appears to be very deconditioned, however despite recommended that  he remain in the hospital for PT and it showed that he is going to tolerate  oral antibiotics. The patient decided that he prefers to be discharged to  home. This case has been discussed with him and his  daughter, and at this  time the patient is being discharged to home. The patient is recommended to  have an outpatient follow-up with [facility] for  evaluation of abnormal cardiac enzymes.      Deirdre Peer. Polite, M.D.  Electronically Signed     RDP/MEDQ  D:  12/18/2004  T:  12/18/2004  Job:  [id number]   cc:   Genene Churn. [facility], M.D.  Fax: [phone]   [facility]

## 2010-07-05 NOTE — H&P (Signed)
NAME:  [name], [name]   MEDICAL RECORD NO.:  [id number]          PATIENT TYPE:  EMS   LOCATION:  MAJO                         FACILITY:  [facility]   PHYSICIAN:  Melissa L. Ladona Ridgel, MD  DATE OF BIRTH:  [birth year]   DATE OF ADMISSION:  12/14/2004  DATE OF DISCHARGE:                                HISTORY & PHYSICAL   CHIEF COMPLAINT:  Shortness of breath, cough, confusion, and failed  outpatient therapy for pneumonia.   The patient has no primary care physician. He is seen by Dr. Avie Echevaria for  his Parkinson disease.   HISTORY OF PRESENT ILLNESS:  The patient is a 70 year old white male who  developed the onset of cold-like symptoms approximately one week ago.  On  Wednesday of last week he went to the urgent care center for evaluation and  was diagnosed with a pneumonia. He was started on oral Biaxin and  azithromycin and appeared to be getting slightly better, however. Today, his  wife who is a CCU nurse found him lying on the floor complaining of  shortness of breath. He initially refused to come to the hospital, but then  agreed and so she brought him in for further evaluation. The patient's wife  gives me most of the history and states that he was speaking incoherently.  He has had fever of 103, but denies any nausea, vomiting, or diarrhea. He  has had weakness over the last 24 to 48 hours and no other review of systems  positive.   PAST MEDICAL HISTORY:  Parkinson disease with history of prostatitis.   PAST SURGICAL HISTORY:  Cervical laminectomy.   SOCIAL HISTORY:  The patient smokes about two to three cigarettes a week. He  is a recovering alcoholic.   FAMILY HISTORY:  Mother is deceased secondary to MI.  Father is deceased  secondary to cancer.   ALLERGIES:  No known drug allergies.   MEDICATIONS:  .  1.  Permax 1 mg b.i.d. and 0.5 mg p.o. q.h.s.  2.  Amantadine 100 mg b.i.d.  3.  Eldepryl 5 mg b.i.d.  4.  Stalevo 50 one tablet  p.o. six times daily.   PHYSICAL EXAMINATION:  VITAL SIGNS: Temperature 99.2, blood pressure 112/67,  pulse 94, respirations 24, saturation 95%.  GENERAL: This is a morbidly white male in moderate respiratory distress.  HEENT: He is normocephalic and atraumatic. Pupils equal, round, and reactive  to light with mild conjunctival injection and yellow mucus in the left  greater than right eye. His mucous membranes are dry.  NECK: Supple. No JVD. No lymph nodes and no carotid bruits.  CHEST: Bilateral coarse carotid rhonchi with decreased breath sounds, left  base greater than right.  CARDIOVASCULAR: Regular rate and rhythm. Positive S1 and S2. No S3 or S4. No  murmurs, rubs, or gallops.  ABDOMEN: Obese, nontender, nondistended. He does not appear to have any  hepatosplenomegaly.  EXTREMITIES: No clubbing, cyanosis, or edema.  NEUROLOGIC: He is awake, but groggy. His power is probably 4+ out of 5  bilaterally symmetrical. Plantars are downgoing. Sensation  is grossly  intact.   Labs reveal an ABG of 7.409 with a PCO2 of 37.2, PO2 78. His white count is  21.7 with a hemoglobin of 2.7, hematocrit 37.7, and platelet count 188,000.  His sodium is 130, potassium 3.7, chloride 99, CO2 23, BUN 28, creatinine  1.3, glucose 108. Blood cultures are pending. Urine culture and sensitivity  are pending.   Chest x-ray shows a left lower lobe pneumonia. EKG shows normal sinus  rhythm, no acute T-wave changes are noted. The rate is in the 80s to 90s on  his rhythm strips.   ASSESSMENT/PLAN:  A 70 year old obese male with one week of upper  respiratory symptoms treated by his wife in the urgent care center with  outpatient antibiotics. The patient is noted to having shortness of breath,  increasing confusion and was found down on the floor today extremely weak.  1.  Cardiovascular. Normal electrocardiogram, but found on the floor with      increasing weakness. We will check cardiac enzymes and brain  natriuretic      peptide to see if there is any component contributing his x-ray      findings.  2.  Pulmonary. Appears to have a dense left lower lobe pneumonia. Will      perform aggressively pulmonary toilet, start Zosyn, azithromycin, and      vancomycin. Will use Humibid, a flutter valve, and EzPAP as well as      chest PT. I will attempt to obtain sputum culture.  3.  GI. I would like to start him on Protonix. He will be on clears and      advance as tolerated to a heart healthy diet with aspiration      precautions.  4.  GU. Will send a UA C&S and because of his extreme weakness and need to      monitor his I&Os. Foley catheter will be placed to gravity.  5.  Endocrine. There are no current issues. He has diabetes and this has      been within normal limits.  6.  Neurologic. He has Parkinson disease. I will continue his medications      and obtain a clinical pharmacology consult to show that there are no      interactions with his medications. Will call Dr. Sandria Manly in the morning and      inform him of the admission.  7.  Deep venous thrombosis prophylaxis with Lovenox.   The patient is a full code and full care.      Melissa L. Ladona Ridgel, MD  Electronically Signed     MLT/MEDQ  D:  12/14/2004  T:  12/14/2004  Job:  [id number]

## 2011-02-07 ENCOUNTER — Ambulatory Visit (INDEPENDENT_AMBULATORY_CARE_PROVIDER_SITE_OTHER): Payer: 59 | Admitting: Family Medicine

## 2011-02-07 DIAGNOSIS — Z23 Encounter for immunization: Secondary | ICD-10-CM

## 2011-03-18 ENCOUNTER — Other Ambulatory Visit: Payer: Self-pay | Admitting: Family Medicine

## 2011-04-14 ENCOUNTER — Other Ambulatory Visit: Payer: Self-pay | Admitting: Family Medicine

## 2011-04-30 ENCOUNTER — Other Ambulatory Visit: Payer: Self-pay | Admitting: Family Medicine

## 2011-05-01 ENCOUNTER — Other Ambulatory Visit: Payer: Self-pay | Admitting: Family Medicine

## 2011-12-15 ENCOUNTER — Encounter: Payer: Self-pay | Admitting: Family Medicine

## 2011-12-15 ENCOUNTER — Ambulatory Visit (INDEPENDENT_AMBULATORY_CARE_PROVIDER_SITE_OTHER): Payer: 59 | Admitting: Family Medicine

## 2011-12-15 VITALS — BP 120/78 | Temp 97.9°F | Wt 262.0 lb

## 2011-12-15 DIAGNOSIS — Z23 Encounter for immunization: Secondary | ICD-10-CM

## 2011-12-15 DIAGNOSIS — E039 Hypothyroidism, unspecified: Secondary | ICD-10-CM

## 2011-12-15 NOTE — Patient Instructions (Addendum)
Schedule complete physical. 

## 2011-12-15 NOTE — Progress Notes (Signed)
  Subjective:    Patient ID: [name], male    DOB: 07-11-1940, 71 y.o.   MRN: [id number]  HPI  Patient has history of morbid obesity and Parkinson's disease. Follow closely by neurology. We've not seen him in almost 2 years. Last time he was seen here he was very deconditioned and debilitated and had difficulty ambulating. He was admitted and had subsequent rehabilitation and excellent job with exercising several days per week and he has lost about 50 pounds since last visit. He still has occasional tendency to stumble and fall but overall doing much better. Question of previous hypothyroidism. Previously on low-dose Synthroid 25 mcg daily. He has not been taking this apparently for several months. Denies any fatigue issues. No constipation. No cold intolerance.  Requesting flu shot. No contraindications. Has not had complete physical in quite some time.   Review of Systems  Constitutional: Negative for fatigue.  Eyes: Negative for visual disturbance.  Respiratory: Negative for cough, chest tightness and shortness of breath.   Cardiovascular: Negative for chest pain, palpitations and leg swelling.  Neurological: Negative for dizziness, syncope, weakness, light-headedness and headaches.       Objective:   Physical Exam  Constitutional: He is oriented to person, place, and time. He appears well-developed and well-nourished.  Neck: Neck supple. No thyromegaly present.  Cardiovascular: Normal rate and regular rhythm.   Pulmonary/Chest: Effort normal and breath sounds normal. No respiratory distress. He has no wheezes. He has no rales.  Musculoskeletal: He exhibits no edema.  Neurological: He is alert and oriented to person, place, and time.          Assessment & Plan:  #1 reported history of hypothyroidism. Recheck TSH. Patient currently off medication  #2 history of Parkinson's disease followed by neurology # 3 health maintenance flu shot given. Schedule complete physical

## 2012-02-20 ENCOUNTER — Ambulatory Visit (INDEPENDENT_AMBULATORY_CARE_PROVIDER_SITE_OTHER): Payer: 59 | Admitting: Family Medicine

## 2012-02-20 ENCOUNTER — Encounter: Payer: Self-pay | Admitting: Family Medicine

## 2012-02-20 VITALS — BP 130/78 | Temp 97.8°F | Wt 231.0 lb

## 2012-02-20 DIAGNOSIS — R350 Frequency of micturition: Secondary | ICD-10-CM

## 2012-02-20 LAB — POCT URINALYSIS DIPSTICK
Bilirubin, UA: NEGATIVE
Blood, UA: NEGATIVE
Glucose, UA: NEGATIVE
Leukocytes, UA: NEGATIVE
Nitrite, UA: NEGATIVE
Protein, UA: NEGATIVE
Spec Grav, UA: 1.025
Urobilinogen, UA: 0.2
pH, UA: 5.5

## 2012-02-20 NOTE — Patient Instructions (Addendum)
Gradually reduce caffeine intake

## 2012-02-20 NOTE — Progress Notes (Signed)
  Subjective:    Patient ID: [name], male    DOB: 02/22/1940, 72 y.o.   MRN: [id number]  HPI Acute visit. Patient complains of some urine frequency and foul odor to urine. No burning. Symptoms couple days duration. No fevers or chills. No nausea or vomiting. No back pain. He has history of Parkinson's disease and takes Stalevo. Has had urinary tract infections in the past. He denies any obstructive symptoms. Occasional nocturia. Drinks about 3-4 caffeinated beverages per day   Review of Systems  Constitutional: Negative for fever and chills.  Gastrointestinal: Negative for nausea, vomiting and abdominal pain.  Musculoskeletal: Positive for back pain.       Objective:   Physical Exam  Constitutional: He appears well-developed and well-nourished.  Cardiovascular: Normal rate and regular rhythm.   Pulmonary/Chest: Effort normal and breath sounds normal. No respiratory distress. He has no wheezes. He has no rales.          Assessment & Plan:  Dysuria. No obstructive symptoms. Urine dipstick reveals 1+ ketones otherwise clear. No evidence for infection. Gradually reduce caffeine intake and increase overall fluid intake

## 2012-03-30 ENCOUNTER — Encounter: Payer: 59 | Admitting: Family Medicine

## 2012-03-30 DIAGNOSIS — Z0289 Encounter for other administrative examinations: Secondary | ICD-10-CM

## 2012-03-30 NOTE — Progress Notes (Signed)
Error - NO SHOW  This encounter was created in error - please disregard. 

## 2012-03-31 ENCOUNTER — Ambulatory Visit: Payer: 59 | Admitting: Family Medicine

## 2012-04-08 ENCOUNTER — Ambulatory Visit (INDEPENDENT_AMBULATORY_CARE_PROVIDER_SITE_OTHER): Payer: 59 | Admitting: Family Medicine

## 2012-04-08 ENCOUNTER — Encounter: Payer: Self-pay | Admitting: Family Medicine

## 2012-04-08 VITALS — BP 122/72 | HR 76 | Temp 98.0°F | Wt 231.0 lb

## 2012-04-08 DIAGNOSIS — J069 Acute upper respiratory infection, unspecified: Secondary | ICD-10-CM

## 2012-04-08 MED ORDER — BENZONATATE 200 MG PO CAPS: 200.0000 mg | ORAL_CAPSULE | Freq: Three times a day (TID) | ORAL | Status: DC | PRN

## 2012-04-08 NOTE — Progress Notes (Signed)
  Subjective:    Patient ID: [name], male    DOB: Jun 20, 1940, 72 y.o.   MRN: [id number]  HPI  Onset 2 weeks ago of cough and upper respiratory congestion.   Overall improved but has cough especially worse at night. Some mild wheezing off and noted. No fever. No sore throat. No dyspnea. He is reluctant to take hydrocodone because of remote history of alcoholism. Nonsmoker  History of Parkinson's disease which is stable  Review of Systems  Constitutional: Negative for fever and chills.  HENT: Negative for congestion and sore throat.   Respiratory: Positive for cough and wheezing. Negative for shortness of breath.   Cardiovascular: Negative for chest pain.  Neurological: Negative for headaches.       Objective:   Physical Exam  Constitutional: He appears well-developed and well-nourished.  HENT:  Mouth/Throat: Oropharynx is clear and moist.  Moderate cerumen in both canals  Neck: Neck supple.  Cardiovascular: Normal rate.   Pulmonary/Chest:  Patient has some mild diffuse expiratory wheezes. No retractions.  Musculoskeletal: He exhibits no edema.  Lymphadenopathy:    He has no cervical adenopathy.          Assessment & Plan:  Viral upper respiratory infection with cough and wheezing. Pulse oximetry 97%. Depo-Medrol 80 mg IM given. Tessalon Perles 200 mg every 8 hours as needed for cough. Followup promptly for fever or worsening symptoms

## 2012-04-08 NOTE — Patient Instructions (Addendum)
Follow up promptly for any fever or increased shortness of breath. 

## 2012-07-06 ENCOUNTER — Telehealth: Payer: Self-pay | Admitting: Neurology

## 2012-07-06 MED ORDER — CARBIDOPA-LEVODOPA-ENTACAPONE 25-100-200 MG PO TABS: 1.0000 | ORAL_TABLET | Freq: Every day | ORAL | Status: DC

## 2012-07-06 NOTE — Telephone Encounter (Signed)
Former [facility] patient assigned to Dr Frances Furbish per Dr Imagene Gurney last OV note.

## 2012-07-14 ENCOUNTER — Encounter: Payer: Self-pay | Admitting: Neurology

## 2012-07-14 ENCOUNTER — Encounter: Payer: Self-pay | Admitting: *Deleted

## 2012-07-14 ENCOUNTER — Ambulatory Visit (INDEPENDENT_AMBULATORY_CARE_PROVIDER_SITE_OTHER): Payer: 59 | Admitting: Neurology

## 2012-07-14 VITALS — BP 122/66 | HR 62 | Temp 96.9°F | Ht 74.0 in | Wt 280.0 lb

## 2012-07-14 DIAGNOSIS — G20 Parkinson's disease: Secondary | ICD-10-CM

## 2012-07-14 HISTORY — DX: Morbid (severe) obesity due to excess calories: E66.01

## 2012-07-14 NOTE — Patient Instructions (Addendum)
I think overall you are doing fairly well but I do want to suggest a few things today:  Remember to drink plenty of fluid, eat healthy meals and do not skip any meals. Try to eat protein with a every meal and eat a healthy snack such as fruit or nuts in between meals. Try to keep a regular sleep-wake schedule and try to exercise daily, particularly in the form of walking, 20-30 minutes a day, if you can. Try to use a walking/hiking stick when walking outside.   Engage in social activities in your community and with your family and try to keep up with current events by reading the newspaper or watching the news.   As far as your medications are concerned, I would like to suggest no changes today.   As far as diagnostic testing: no new test needed.   I would like to see you back in 6 months, sooner if we need to. Please call us with any interim questions, concerns, problems, updates or refill requests.  Brett Canales is my clinical assistant and will answer any of your questions and relay your messages to me and also relay most of my messages to you.  Our phone number is [phone]. We also have an after hours call service for urgent matters and there is a physician on-call for urgent questions. For any emergencies you know to call 911 or go to the nearest emergency room.

## 2012-07-14 NOTE — Progress Notes (Signed)
Subjective:    Patient ID: [name] is a 72 y.o. male.  HPI  Interim history:   [name] is a very pleasant 72 year old left-handed gentleman who presents for followup consultation of his idiopathic Parkinson's disease. He is unaccompanied today. This is his first visit with me and he previously followed with Dr. Avie Echevaria and was last seen by him on 02/25/2012, at which time Dr. Sandria Manly felt that the patient was doing well. He was exercising regularly. He had a few dyskinesias. There were no medication changes at the time. The patient has an underlying medical history of degenerative disc disease, alcoholism in the past, history of syncope, obesity, and remote history of pneumonia. He is status post neck surgery at C6-7. He is currently on Stalevo 100 mg strength one tablet 6 times daily (4 am, 8 am, noon, 4 pm, 8 pm, and MN), multivitamin, vitamin B12.  I reviewed Dr. Imagene Gurney prior notes and the patient's records and below is a summary of that review:  72 year old left-handed gentleman who was diagnosed with right-sided predominant Parkinson's disease in May 1996 by Dr. Buzzy Han. He was in selegiline 5 mg in the beginning and in 1999 he was started on amantadine. He discontinued his these medications and started Mirapex but had side effects and discontinued it after a month. He started seeing Dr. love in June 2000 and was restarted on selegiline and amantadine. He was then started on Permax. This was changed and 2005 to Stalevo and Requip was added but discontinued because of excessive daytime somnolence. He has a history of obesity and lost 60 pounds with an exercise program. He has had some memory loss. He had neck surgery in 1996. A sleep study in April 2008 showed hypotony is. In January of this year his MMSE was 29, clock drawing was 4, animal fluency was 13.  He is scheduled for jury duty and is requesting a letter to excuse him from this. He goest [facility] 3 times a week on M/W/F and has not had  any recent falls. He walks on those other days, 1 mile/day. He lives with his wife in his own 2 storey home. Bedroom is down stairs. He has no new complaints.   His Past Medical History Is Significant For: Past Medical History  Diagnosis Date  . UTI (urinary tract infection)   . Parkinson disease   . Venous stasis     edema  . Obesity   . Hypothyroidism   . Morbid obesity 07/14/2012    His Past Surgical History Is Significant For: Past Surgical History  Procedure Laterality Date  . Cervical laminectomy    . Foot surgery  1992    right foot repair  . Appendectomy    . Anterior cervical discectomy c6-7      His Family History Is Significant For: Family History  Problem Relation Age of Onset  . Heart disease Mother   . Hyperlipidemia Mother   . Colon cancer Father   . Alcohol abuse Other     His Social History Is Significant For: History   Social History  . Marital Status: Married    Spouse Name: N/A    Number of Children: 3  . Years of Education: college   Occupational History  .     Social History Main Topics  . Smoking status: Never Smoker   . Smokeless tobacco: None  . Alcohol Use: No  . Drug Use: No  . Sexually Active: None   Other Topics  Concern  . None   Social History Narrative   Regular exercise-No   Retired    His Allergies Are:  No Known Allergies:   His Current Medications Are:  Outpatient Encounter Prescriptions as of 07/14/2012  Medication Sig Dispense Refill  . benzonatate (TESSALON) 200 MG capsule Take 1 capsule (200 mg total) by mouth 3 (three) times daily as needed for cough.  30 capsule  0  . carbidopa-levodopa-entacapone (STALEVO 100) 25-100-200 MG per tablet Take 1 tablet by mouth 6 (six) times daily.  180 tablet  6  . cyanocobalamin 100 MCG tablet Take 100 mcg by mouth daily.        . Multiple Vitamins-Minerals (MENS MULTIVITAMIN PLUS) TABS Take 1 tablet by mouth daily.        Marland Kitchen triamcinolone (KENALOG) 0.1 % cream Apply topically  2 (two) times daily as needed. Apply to affected area.        No facility-administered encounter medications on file as of 07/14/2012.   Review of Systems  All other systems reviewed and are negative.    Objective:  Neurologic Exam  Physical Exam Physical Examination:   Filed Vitals:   07/14/12 1006  BP: 122/66  Pulse: 62  Temp: 96.9 F (36.1 C)    General Examination: The patient is a very pleasant 72 y.o. male in no acute distress. He is morbidly obese.  HEENT: Normocephalic, atraumatic, pupils are equal, round and reactive to light and accommodation. Funduscopic exam is normal with sharp disc margins noted. Extraocular tracking shows mild saccadic breakdown without nystagmus noted. There is limitation to upper gaze. There is mild decrease in eye blink rate. Hearing is intact. Tympanic membranes are clear bilaterally. Face is symmetric with moderate facial masking of mouth open most of the time and normal facial sensation. There is no lip, neck or jaw tremor. Neck is moderately rigid with intact passive ROM. There are no carotid bruits on auscultation. Oropharynx exam reveals no significant mouth dryness. No significant airway crowding is noted. Mallampati is class II. Tongue protrudes centrally and palate elevates symmetrically.   There is mild drooling.   Chest: is clear to auscultation without wheezing, rhonchi or crackles noted.  Heart: sounds are regular and normal without murmurs, rubs or gallops noted.   Abdomen: is soft, non-tender and non-distended with normal bowel sounds appreciated on auscultation.  Extremities: There is trace pitting edema in the distal lower extremities bilaterally. Chronic stasis-like changes are noted in the distal legs bilaterally. There are no varicose veins, but spider veins are present.  Skin: is warm and dry with no trophic changes noted. Age-related changes are noted on the skin.   Musculoskeletal: exam reveals no obvious joint  deformities, tenderness, joint swelling or erythema.  Neurologically:  Mental status: The patient is awake and alert, paying good  attention. He is able to completely provide the history. He is oriented to: person, place, situation, day of week, month of year and year. His memory, attention, language and knowledge are fairly well intact. There is no aphasia, agnosia, apraxia or anomia. There is a no significant degree of bradyphrenia. Speech is moderately hypophonic with moderate dysarthria noted. Mood is congruent and affect is normal.   Cranial nerves are as described above under HEENT exam. In addition, shoulder shrug is normal with equal shoulder height noted.  Motor exam: Normal bulk, and strength for age is noted. There are mild dyskinesias noted. These are primarily noted in the extremities. Tone is mildly rigid with presence of cogwheeling  in the bilateral upper extremities. There is overall moderate bradykinesia. There is no drift or rebound.  There is no tremor. Romberg is negative.  Reflexes are trace in the upper extremities and absent in the lower extremities.   Fine motor skills exam: Finger taps are moderately impaired on the right and mildly impaired on the left. Hand movements are moderately impaired on the right and mildly impaired on the left. RAP (rapid alternating patting) is moderately impaired on the right and mildly impaired on the left. Foot taps are moderately impaired on the right and mildly impaired on the left. Foot agility (in the form of heel stomping) is moderately impaired on the right and mildly impaired on the left.    Cerebellar testing shows no dysmetria or intention tremor on finger to nose testing. Heel to shin is unremarkable bilaterally. There is no truncal or gait ataxia.   Sensory exam is intact to light touch, pinprick, vibration, temperature sense and proprioception in the upper and lower extremities.   Gait, station and balance: He stands up from the  seated position with mild difficulty and does need to push up with His hands. He needs no assistance. No veering to one side is noted. He is noted to lean to the right. Posture is mildly stooped. Stance is wide-based. He walks with decrease in stride length and pace and decreased arm swing and more pronounced dyskinesias in his upper extremities. He turns in 3 steps. He walks with a waddle. Tandem walk is not possible. Balance is mildly impaired. He is not able to do a toe or heel stance.      Assessment and Plan:   Assessment and Plan:  In summary, [name] is a very pleasant 72 y.o.-year old male with a history of advanced Parkinson's disease, right-sided predominant. His physical exam is stable and has not progressed in the last months. He is doing fairly well at this time and I reassured the patient in that regard.  I had a long chat with the patient about my findings and the diagnosis of Parksinson's disease, its prognosis and treatment options. We talked about medical treatments and non-pharmacological approaches. We talked about maintaining a healthy lifestyle in general and about exercising regularly and weight loss in particular. I encouraged the patient to eat healthy, exercise daily and keep well hydrated, to keep a scheduled bedtime and wake time routine, to not skip any meals and eat healthy snacks in between meals and to have protein with every meal. I stressed the importance of regular exercise, weight and of course the patient's own mobility limitations.   As far as further diagnostic testing is concerned, I suggested: no change.  As far as medications are concerned, I recommended the following at this time: no change. He did not require a refill today. I answered all his questions today and the patient was in agreement with the above outlined plan. I would like to see the patient back in 6 months, sooner if the need arises and encouraged him to call with any interim questions,  concerns, problems or updates and refill requests. I provided a letter for him to excuse him from jury duty and remove him from the list permanently.

## 2012-07-16 DIAGNOSIS — Z0289 Encounter for other administrative examinations: Secondary | ICD-10-CM

## 2012-11-23 DIAGNOSIS — Z0289 Encounter for other administrative examinations: Secondary | ICD-10-CM

## 2012-12-02 ENCOUNTER — Telehealth: Payer: Self-pay

## 2012-12-02 NOTE — Telephone Encounter (Signed)
I called patient's wife to review patient's medical status and FMLA form status. I will have forms sent out for signature today and will call wife when complete.

## 2012-12-03 ENCOUNTER — Telehealth: Payer: Self-pay

## 2012-12-03 ENCOUNTER — Ambulatory Visit: Payer: 59

## 2012-12-03 NOTE — Telephone Encounter (Signed)
  I called patient's wife at work but she was not available. I called the house number and spoke with the patient. He will have wife pick up forms on Monday.

## 2012-12-10 ENCOUNTER — Ambulatory Visit: Payer: 59

## 2012-12-16 ENCOUNTER — Ambulatory Visit (INDEPENDENT_AMBULATORY_CARE_PROVIDER_SITE_OTHER): Payer: 59

## 2012-12-16 DIAGNOSIS — Z23 Encounter for immunization: Secondary | ICD-10-CM

## 2012-12-20 ENCOUNTER — Other Ambulatory Visit: Payer: Self-pay | Admitting: Neurology

## 2012-12-23 ENCOUNTER — Other Ambulatory Visit: Payer: Self-pay | Admitting: Neurology

## 2012-12-23 ENCOUNTER — Telehealth: Payer: Self-pay | Admitting: Neurology

## 2012-12-24 ENCOUNTER — Telehealth: Payer: Self-pay

## 2012-12-24 MED ORDER — CARBIDOPA-LEVODOPA-ENTACAPONE 25-100-200 MG PO TABS: 1.0000 | ORAL_TABLET | Freq: Every day | ORAL | Status: DC

## 2012-12-24 NOTE — Telephone Encounter (Signed)
I called pt to let him know that the medication was verbally called in to [place] [facility].  Relayed that they will only give medication 7 days prior to new refills.  He verbalized understanding.

## 2012-12-24 NOTE — Telephone Encounter (Signed)
I returned call to patient. rx will be submitted to pharmacy within next few minutes.

## 2012-12-27 NOTE — Telephone Encounter (Signed)
Call Arlys John at [facility], patient is about 2 months ahead on his scripts. His last stalevo Rx was filled on 12/01/12. Patient was at pharmacy today threatening that if he did not get a refill today, that he would go to the emergency room. Patient does this all the time. It appears he is taking 8- 9  stalevo daily instead of 6.

## 2012-12-28 NOTE — Telephone Encounter (Signed)
I have seen patient one time in May and he is due for followup in the next couple of weeks. Please advise patient not to take his medication other than prescribed. His Stalevo is supposed to be taken 6 times a day. Please advise him that I have been notified that he has tried to refill his medication sooner than it should be due for a refill. Please advise him to keep his followup appointment coming up this month.

## 2012-12-28 NOTE — Telephone Encounter (Signed)
I called patient and left VM to take medication as ordered 6 x day. Dr. Freddie Breech will review medication needs when he comes for OV in 2 weeks.

## 2013-01-11 ENCOUNTER — Encounter (INDEPENDENT_AMBULATORY_CARE_PROVIDER_SITE_OTHER): Payer: Self-pay

## 2013-01-11 ENCOUNTER — Encounter: Payer: Self-pay | Admitting: Neurology

## 2013-01-11 ENCOUNTER — Ambulatory Visit (INDEPENDENT_AMBULATORY_CARE_PROVIDER_SITE_OTHER): Payer: 59 | Admitting: Neurology

## 2013-01-11 VITALS — BP 124/76 | HR 53 | Ht 74.0 in | Wt 268.0 lb

## 2013-01-11 DIAGNOSIS — M479 Spondylosis, unspecified: Secondary | ICD-10-CM

## 2013-01-11 DIAGNOSIS — G20 Parkinson's disease: Secondary | ICD-10-CM

## 2013-01-11 DIAGNOSIS — G4733 Obstructive sleep apnea (adult) (pediatric): Secondary | ICD-10-CM

## 2013-01-11 MED ORDER — RASAGILINE MESYLATE 1 MG PO TABS: 1.0000 mg | ORAL_TABLET | Freq: Every day | ORAL | Status: DC

## 2013-01-11 NOTE — Progress Notes (Signed)
Subjective:    Patient ID: [name] is a 72 y.o. male.  HPI    Interim history:   [name] is a very pleasant 72 year old left-handed gentleman who presents for followup consultation of his advanced right-sided predominant Parkinson's disease. He is unaccompanied today. I first met him on 07/14/2012, and which time I felt he was stable and it did not make any medication changes. He requested a letter to excuse him from jury duty which I provided.  In the interim, I was notified that he was trying to fill his Stalevo prescription earlier than scheduled and perhaps overusing Stalevo. The pharmacy note from [facility] pharmacy states that he presented there demanding a refill and threatening to go to the emergency room if he would not get his prescription. He previously followed with Dr. Avie Echevaria and was last seen by him on 02/25/2012, at which time Dr. Sandria Manly felt that the patient was doing well. He was exercising regularly. He had a few dyskinesias. There were no medication changes at the time. The patient has an underlying medical history of degenerative disc disease, alcoholism in the past, history of syncope, obesity, and remote history of pneumonia. He is status post neck surgery at C6-7. He is currently on Stalevo 100 mg strength one tablet 6 times daily (4 am, 8 am, noon, 4 pm, 8 pm, and MN), multivitamin, vitamin B12. He does indicate taking it more at times.  He was diagnosed with right-sided predominant Parkinson's disease in May 1996 by Dr. Buzzy Han. He was on selegiline 5 mg in the beginning and in 1999 he was started on amantadine. He discontinued his these medications and started Mirapex but had side effects and discontinued it after a month. He started seeing Dr. Sandria Manly in June 2000 and was re-started on selegiline and amantadine. He was then started on Permax. This was changed and 2005 to Stalevo and Requip was added but discontinued because of excessive daytime somnolence. He has a  history of obesity and lost 60 pounds with an exercise program. He has had some memory loss. He had neck surgery in 1996. A sleep study in April 2008 showed hypopneas. In January of this year his MMSE was 29, clock drawing was 4, animal fluency was 13.  [facility] a [facility] on M/W/F and has not had any recent falls. He walks on those other days, 1 mile/day. He lives with his wife in his own 2 storey home. His bedroom is downstairs. He snores and therefore, sleeps in a separate bedroom. His wife is in school to become a NP.  He has fallen in the past, last some 3 weeks ago on carpet and did not hurt himself.  I reviewed his sleep study from 05/27/06: AHI was 10.1, based on 62 hypopneas and his REM AHI 27.1/h, his O2 nadir was 78% and he spent 17.6 minutes below the saturation of 88%. It was recommended that he come back for CPAP titration but that never came to fruition for one reason or another. He states that he was told he did not have to be treated with CPAP. He does endorses taking one nap a day, 30 minutes usually.   His Past Medical History Is Significant For: Past Medical History  Diagnosis Date  . UTI (urinary tract infection)   . Parkinson disease   . Venous stasis     edema  . Obesity   . Hypothyroidism   . Morbid obesity 07/14/2012   His  Past Surgical History Is Significant For: Past Surgical History  Procedure Laterality Date  . Cervical laminectomy    . Foot surgery  1992    right foot repair  . Appendectomy    . Anterior cervical discectomy c6-7     His Family History Is Significant For: Family History  Problem Relation Age of Onset  . Heart disease Mother   . Hyperlipidemia Mother   . Colon cancer Father   . Alcohol abuse Other    His Social History Is Significant For: History   Social History  . Marital Status: Married    Spouse Name: N/A    Number of Children: 3  . Years of Education: college   Occupational History  .     Social History Main Topics   . Smoking status: Never Smoker   . Smokeless tobacco: None  . Alcohol Use: No  . Drug Use: No  . Sexual Activity: None   Other Topics Concern  . None   Social History Narrative   Regular exercise-No   Retired   His Allergies Are:  No Known Allergies:   His Current Medications Are:  Outpatient Encounter Prescriptions as of 01/11/2013  Medication Sig  . carbidopa-levodopa-entacapone (STALEVO 100) 25-100-200 MG per tablet Take 1 tablet by mouth 6 (six) times daily.  . cyanocobalamin 100 MCG tablet Take 100 mcg by mouth daily.    . Multiple Vitamins-Minerals (MENS MULTIVITAMIN PLUS) TABS Take 1 tablet by mouth daily.    . [DISCONTINUED] benzonatate (TESSALON) 200 MG capsule Take 1 capsule (200 mg total) by mouth 3 (three) times daily as needed for cough.  . [DISCONTINUED] triamcinolone (KENALOG) 0.1 % cream Apply topically 2 (two) times daily as needed. Apply to affected area.    Review of Systems:   Out of a complete 14 point review of systems, all are reviewed and negative with the exception of these symptoms as listed below:   Review of Systems  Constitutional: Negative.   HENT: Negative.   Eyes: Negative.   Respiratory: Negative.   Cardiovascular: Negative.   Gastrointestinal: Negative.   Endocrine: Negative.   Genitourinary: Negative.   Musculoskeletal: Negative.   Skin: Negative.   Allergic/Immunologic: Negative.   Neurological: Negative.   Hematological: Negative.   Psychiatric/Behavioral: Negative.   All other systems reviewed and are negative.   Objective:  Neurologic Exam  Physical Exam Physical Examination:   Filed Vitals:   01/11/13 1114  BP: 124/76  Pulse: 53   General Examination: The patient is a very pleasant 72 y.o. male in no acute distress. He is morbidly obese, but has lost weight since his sleep study (was 300 lb at the time) and since his last visit (was 280 lb at the time).   HEENT: Normocephalic, atraumatic, pupils are equal, round and  reactive to light and accommodation. Extraocular tracking shows mild saccadic breakdown without nystagmus noted. There is limitation to upper gaze. There is mild decrease in eye blink rate. Hearing is intact. Face is symmetric with moderate facial masking and mouth is open most of the time with normal facial sensation and mild facial dyskinesias. There is no lip, neck or jaw tremor. Neck is moderately rigid with intact passive ROM. There are no carotid bruits on auscultation. Oropharynx exam reveals no significant mouth dryness. No significant airway crowding is noted. Mallampati is class II. Tongue protrudes centrally and palate elevates symmetrically. There is mild drooling.   Chest: is clear to auscultation without wheezing, rhonchi or crackles noted.  Heart: sounds are regular and normal without murmurs, rubs or gallops noted.   Abdomen: is soft, non-tender and non-distended with normal bowel sounds appreciated on auscultation.  Extremities: There is trace pitting edema in the distal lower extremities bilaterally. Chronic stasis-like changes are noted in the distal legs bilaterally. There are no varicose veins, but spider veins are present.  Skin: is warm and dry with no trophic changes noted. Age-related changes are noted on the skin.   Musculoskeletal: exam reveals no obvious joint deformities, tenderness, joint swelling or erythema.  Neurologically:  Mental status: The patient is awake and alert, paying good  attention. He is able to completely provide the history. He is oriented to: person, place, situation, day of week, month of year and year. His memory, attention, language and knowledge are fairly well intact. There is no aphasia, agnosia, apraxia or anomia. There is a no significant degree of bradyphrenia. Speech is moderately hypophonic with moderate dysarthria noted. Mood is congruent and affect is normal.   Cranial nerves are as described above under HEENT exam. In addition, shoulder  shrug is normal with equal shoulder height noted.  Motor exam: Normal bulk, and strength for age is noted. There are mild generalized dyskinesias noted. Tone is mildly rigid with presence of cogwheeling in the bilateral upper extremities. There is overall moderate bradykinesia. There is no drift or rebound.  There is no tremor. Romberg is negative.  Reflexes are trace in the upper extremities and absent in the lower extremities.   Fine motor skills exam: Finger taps are moderately impaired on the right and mildly impaired on the left. Hand movements are moderately impaired on the right and mildly impaired on the left. RAP (rapid alternating patting) is moderately impaired on the right and mildly impaired on the left. Foot taps are moderately impaired on the right and mildly impaired on the left. Foot agility (in the form of heel stomping) is moderately impaired on the right and mildly impaired on the left.    Cerebellar testing shows no dysmetria or intention tremor on finger to nose testing. There is no truncal or gait ataxia.   Sensory exam is intact to light touch, pinprick, vibration, temperature sense in the upper and lower extremities.   Gait, station and balance: He stands up from the seated position with mild difficulty and does need to push up with His hands. He needs no assistance. No veering to one side is noted. He is noted to lean to the right. Posture is mild to moderately stooped. Stance is wide-based. He walks with decrease in stride length and pace and decreased arm swing and more pronounced dyskinesias in his upper extremities. He turns in 3 steps. He walks with a waddle. Tandem walk is not possible. Balance is mildly impaired. He is not able to do a toe or heel stance.     Assessment and Plan:   In summary, [name] is a very pleasant 72 year old male with a history of advanced Parkinson's disease, right-sided predominant, complicated by dyskinesias and some medication  intolerance in the past. He has been on amantadine, Permax, Mirapex, selegiline, and Requip in the past. He has remained fairly stable. He has at times taken more than prescribed Stalevo. He is a recovering alcoholic and sometimes there is a component of addiction to levodopa I explained to him. I would really like for him to stay at the same dose of Stalevo namely 6 times a day at 4 hourly intervals. As an adjunct  I would like for him to start Azilect. I provided him with samples for 2 weeks on the 0.5 mg strength and one-week sample of the 1 mg strength. Furthermore I put in a prescription for Azilect 1 mg strength, to be filled when he is done with the samples. He is advised not to try to fill his Stalevo a prescription ahead of time. He has prior diagnosis of obstructive sleep apnea and has never been treated. While his overall AHI suggests mild sleep apnea his oxygen desaturation to 78% is concerning. Of course, at the time he was heavier than now. I still think he needs to be rechecked and if he still has obstructive sleep apnea with significant desaturations, he should consider treatment with CPAP. I talked to him about sleep apnea and he would be willing to get retested and also consider CPAP if the need arises. To that end I placed an order for a sleep study. We also talked about DBS surgery. He would be interested to pursue this down the Road. I think he would be a fairly good candidate but explained to him that he needs to be a good surgical candidate in general. He should continue to work on weight loss and if he has sleep apnea he needs to start treatment for this. He understood. I would like to see him back in a few months from now. I may have to see him soon after his sleep study depending on the findings as well. He was explained the potential side effects of Azilect today. He had no further questions. I encouraged him to call with any interim questions, concerns, problems or updates and refill  requests.

## 2013-01-11 NOTE — Patient Instructions (Addendum)
I think overall you are doing fairly well but I do want to suggest a few things today:  Remember to drink plenty of fluid, eat healthy meals and do not skip any meals. Try to eat protein with a every meal and eat a healthy snack such as fruit or nuts in between meals. Try to keep a regular sleep-wake schedule and try to exercise daily, particularly in the form of walking, 20-30 minutes a day, if you can.   Engage in social activities in your community and with your family and try to keep up with current events by reading the newspaper or watching the news.   As far as your medications are concerned, I would like to suggest keeping the same dose of Stalevo at 1 pill 6 times a day. We will start Azilect (generic name: rasagiline); take 0.5 mg strength (sample, yellow box): 1 pill each morning for 2 weeks, then 1 mg strength (blue box): 1 pill each morning thereafter and ongoing until further notice, or unless you develop an adverse reaction or side effects. Common side effects reported are headaches, nausea, diarrhea, or constipation. Rare side effects can be confusion, personality changes, hallucinations.  As far as diagnostic testing: sleep study for re-evaluation of your sleep apnea.   I would like to see you back in 3 months, sooner if we need to. Please call us with any interim questions, concerns, problems, updates or refill requests.  Brett Canales is my clinical assistant and will answer any of your questions and relay your messages to me and also relay most of my messages to you.  Our phone number is [phone]. We also have an after hours call service for urgent matters and there is a physician on-call for urgent questions. For any emergencies you know to call 911 or go to the nearest emergency room.

## 2013-01-27 ENCOUNTER — Encounter: Payer: Self-pay | Admitting: Neurology

## 2013-02-28 ENCOUNTER — Telehealth: Payer: Self-pay | Admitting: Neurology

## 2013-03-01 NOTE — Telephone Encounter (Signed)
Brett CanalesSteve: Please write a letter stating that this patient has Parkinson's disease and as part of his Parkinson's disease treatment regimen has been advised to exercise regularly within his own mobility limitations.

## 2013-03-01 NOTE — Telephone Encounter (Signed)
Please advise 

## 2013-03-03 NOTE — Telephone Encounter (Signed)
[name] wife called to check on the statement stating the PT/going to the gym is medically necessary.  She needs this for the [employer].  Please contact Mr. [name] at home # [phone].  Thank you

## 2013-03-04 NOTE — Telephone Encounter (Signed)
Patient calling to follow up on this, please call the patient back.

## 2013-03-09 ENCOUNTER — Encounter: Payer: Self-pay | Admitting: *Deleted

## 2013-06-06 ENCOUNTER — Other Ambulatory Visit: Payer: Self-pay | Admitting: Neurology

## 2013-07-12 ENCOUNTER — Ambulatory Visit: Payer: 59 | Admitting: Neurology

## 2013-07-25 ENCOUNTER — Other Ambulatory Visit: Payer: Self-pay | Admitting: Neurology

## 2013-07-29 ENCOUNTER — Encounter: Payer: Self-pay | Admitting: Family Medicine

## 2013-07-29 ENCOUNTER — Ambulatory Visit (INDEPENDENT_AMBULATORY_CARE_PROVIDER_SITE_OTHER): Payer: 59 | Admitting: Family Medicine

## 2013-07-29 VITALS — BP 132/90 | HR 70 | Temp 97.6°F | Wt 284.0 lb

## 2013-07-29 DIAGNOSIS — Z23 Encounter for immunization: Secondary | ICD-10-CM

## 2013-07-29 DIAGNOSIS — M25569 Pain in unspecified knee: Secondary | ICD-10-CM

## 2013-07-29 DIAGNOSIS — M25561 Pain in right knee: Secondary | ICD-10-CM

## 2013-07-29 DIAGNOSIS — Z2911 Encounter for prophylactic immunotherapy for respiratory syncytial virus (RSV): Secondary | ICD-10-CM

## 2013-07-29 NOTE — Progress Notes (Signed)
Pre visit review using our clinic review tool, if applicable. No additional management support is needed unless otherwise documented below in the visit note. 

## 2013-07-29 NOTE — Progress Notes (Signed)
   Subjective:    Patient ID: [name], male    DOB: 02/28/1940, 73 y.o.   MRN: [id number]  Knee Pain    Right knee pain. Patient has history of morbid obesity and Parkinson's disease. His been exercising about 3 days per week. Denies recent injury. Location is right medial knee. Progressive over several months. Minimal (if any) effusion. No locking or giving way. He does not take anti-inflammatory medications. He is high risk of falls related to his Parkinson's disease. Pain does not radiate. He has not noted any warmth or erythema. No history of ecchymosis. No recent x-rays  Patient requesting shingles vaccine. No contraindications.  Past Medical History  Diagnosis Date  . UTI (urinary tract infection)   . Parkinson disease   . Venous stasis     edema  . Obesity   . Hypothyroidism   . Morbid obesity 07/14/2012   Past Surgical History  Procedure Laterality Date  . Cervical laminectomy    . Foot surgery  1992    right foot repair  . Appendectomy    . Anterior cervical discectomy c6-7      reports that he has never smoked. He does not have any smokeless tobacco history on file. He reports that he does not drink alcohol or use illicit drugs. family history includes Alcohol abuse in his other; Colon cancer in his father; Heart disease in his mother; Hyperlipidemia in his mother. No Known Allergies    Review of Systems  Constitutional: Negative for fever and chills.  Musculoskeletal: Positive for arthralgias.       Objective:   Physical Exam  Constitutional: He appears well-developed and well-nourished.  Cardiovascular: Normal rate and regular rhythm.   Pulmonary/Chest: Effort normal and breath sounds normal. No respiratory distress. He has no wheezes. He has no rales.  Musculoskeletal:  Right knee reveals full range of motion. He has probable very small effusion. No warmth. No erythema. No ecchymosis. Mild medial joint line tenderness. No lateral tenderness. Ligament  testing is normal. No leg edema          Assessment & Plan:  #1 right knee pain. Suspect degenerative arthritis. We discussed the importance of regular exercises for strengthening. Obtain x-rays right knee. Avoid regular use of NSAIDS given his age. #2 health maintenance. Patient requesting shingles vaccine. No contraindications.  This is given.

## 2013-08-16 ENCOUNTER — Ambulatory Visit (INDEPENDENT_AMBULATORY_CARE_PROVIDER_SITE_OTHER): Payer: 59 | Admitting: Physician Assistant

## 2013-08-16 ENCOUNTER — Encounter: Payer: Self-pay | Admitting: Physician Assistant

## 2013-08-16 VITALS — BP 120/70 | HR 96 | Temp 97.1°F | Resp 18

## 2013-08-16 DIAGNOSIS — R350 Frequency of micturition: Secondary | ICD-10-CM

## 2013-08-16 DIAGNOSIS — N390 Urinary tract infection, site not specified: Secondary | ICD-10-CM

## 2013-08-16 LAB — POCT URINALYSIS DIPSTICK
Nitrite, UA: POSITIVE
PH UA: 5.5
Spec Grav, UA: 1.025
UROBILINOGEN UA: 0.2

## 2013-08-16 MED ORDER — CIPROFLOXACIN HCL 500 MG PO TABS: 500.0000 mg | ORAL_TABLET | Freq: Two times a day (BID) | ORAL | Status: DC

## 2013-08-16 NOTE — Progress Notes (Signed)
Subjective:    Patient ID: [name] B Leng, male    DOB: 12/29/1940, 73 y.o.   MRN: [id number]  Urinary Frequency  This is a new problem. The current episode started yesterday. The problem occurs every urination. The problem has been unchanged. The quality of the pain is described as burning. The pain is at a severity of 6/10. Maximum temperature: fever this AM did not check. Associated symptoms include frequency and urgency. Pertinent negatives include no chills, discharge, flank pain, hematuria, hesitancy, nausea, sweats or vomiting. He has tried increased fluids for the symptoms. The treatment provided no relief. His past medical history is significant for a urological procedure. There is no history of catheterization, kidney stones, recurrent UTIs, a single kidney or urinary stasis.      Review of Systems  Constitutional: Positive for fever. Negative for chills.  Respiratory: Negative for shortness of breath.   Gastrointestinal: Negative for nausea, vomiting and diarrhea.  Genitourinary: Positive for dysuria, urgency and frequency. Negative for hesitancy, hematuria and flank pain.       Urinary incontinence Urine odor.  All other systems reviewed and are negative.    Past Medical History  Diagnosis Date  . UTI (urinary tract infection)   . Parkinson disease   . Venous stasis     edema  . Obesity   . Hypothyroidism   . Morbid obesity 07/14/2012    History   Social History  . Marital Status: Married    Spouse Name: N/A    Number of Children: 3  . Years of Education: college   Occupational History  .     Social History Main Topics  . Smoking status: Never Smoker   . Smokeless tobacco: Not on file  . Alcohol Use: No  . Drug Use: No  . Sexual Activity: Not on file   Other Topics Concern  . Not on file   Social History Narrative   Regular exercise-No   Retired    Past Surgical History  Procedure Laterality Date  . Cervical laminectomy    . Foot surgery  1992      right foot repair  . Appendectomy    . Anterior cervical discectomy c6-7      Family History  Problem Relation Age of Onset  . Heart disease Mother   . Hyperlipidemia Mother   . Colon cancer Father   . Alcohol abuse Other     No Known Allergies  Current Outpatient Prescriptions on File Prior to Visit  Medication Sig Dispense Refill  . carbidopa-levodopa-entacapone (STALEVO) 25-100-200 MG per tablet TAKE 1 TABLET BY MOUTH 6 TIMES DAILY  180 tablet  1  . cyanocobalamin 100 MCG tablet Take 100 mcg by mouth daily.        . Multiple Vitamins-Minerals (MENS MULTIVITAMIN PLUS) TABS Take 1 tablet by mouth daily.        . rasagiline (AZILECT) 1 MG TABS tablet Take 1 tablet (1 mg total) by mouth daily.  30 tablet  5   No current facility-administered medications on file prior to visit.    EXAM: BP 120/70  Pulse 96  Temp(Src) 97.1 F (36.2 C) (Oral)  Resp 18     Objective:   Physical Exam  Nursing note and vitals reviewed. Constitutional: He is oriented to person, place, and time. He appears well-developed and well-nourished. No distress.  HENT:  Head: Normocephalic and atraumatic.  Eyes: Conjunctivae and EOM are normal. Pupils are equal, round, and reactive to light.  Neck:  Normal range of motion.  Cardiovascular: Normal rate, regular rhythm and intact distal pulses.   Pulmonary/Chest: Effort normal and breath sounds normal. No respiratory distress. He exhibits no tenderness.  Musculoskeletal: Normal range of motion.  Neurological: He is alert and oriented to person, place, and time.  Skin: Skin is warm and dry. No rash noted. He is not diaphoretic. No erythema. No pallor.  Psychiatric: He has a normal mood and affect. His behavior is normal. Judgment and thought content normal.    Lab Results  Component Value Date   WBC 6.6 02/07/2010   HGB 11.1* 02/07/2010   HCT 33.6* 02/07/2010   PLT 241 02/07/2010   GLUCOSE 94 02/07/2010   CHOL 157 05/15/2009   TRIG 84.0  05/15/2009   HDL 44.30 05/15/2009   LDLCALC 96 05/15/2009   ALT <8 01/30/2010   AST 19 01/30/2010   NA 137 02/07/2010   K 4.2 02/07/2010   CL 104 02/07/2010   CREATININE 0.93 02/07/2010   BUN 16 02/07/2010   CO2 27 02/07/2010   TSH 2.804 01/30/2010   PSA 2.44 05/15/2009    Urinalysis    Component Value Date/Time   COLORURINE YELLOW 01/30/2010 1742   APPEARANCEUR CLEAR 01/30/2010 1742   LABSPEC 1.015 01/30/2010 1742   PHURINE 5.0 01/30/2010 1742   GLUCOSEU NEGATIVE 01/30/2010 1742   HGBUR NEGATIVE 01/30/2010 1742   BILIRUBINUR 1+ 08/16/2013 1534   BILIRUBINUR NEGATIVE 01/30/2010 1742   KETONESUR NEGATIVE 01/30/2010 1742   PROTEINUR 2+ 08/16/2013 1534   PROTEINUR NEGATIVE 01/30/2010 1742   UROBILINOGEN 0.2 08/16/2013 1534   UROBILINOGEN 0.2 01/30/2010 1742   NITRITE pos 08/16/2013 1534   NITRITE NEGATIVE 01/30/2010 1742   LEUKOCYTESUR Trace 08/16/2013 1534       Assessment & Plan:  Fayrene FearingJames was seen today for urinary frequency and urine odor.  Diagnoses and associated orders for this visit:  Urinary frequency - POCT urinalysis dipstick - Culture, Urine - ciprofloxacin (CIPRO) 500 MG tablet; Take 1 tablet (500 mg total) by mouth 2 (two) times daily.  UTI (lower urinary tract infection) Comments: Will Culture urie. Treat with Cipro. - ciprofloxacin (CIPRO) 500 MG tablet; Take 1 tablet (500 mg total) by mouth 2 (two) times daily.    Pt has tolerated Cipro in the past for UTI. PT advised on risk of interaction with the MAOI he takes for parkinsons, he acknowledges, and will report any adverse effects to medication immediately.  Increase fluid intake.  Return precautions provided, and patient handout on UTI.  Plan to follow up as needed, or for worsening or persistent symptoms despite treatment.  Patient Instructions  Cipro 1 pill twice daily for 7 days for UTI.  Increase fluid intake.  Continue to monitor symptoms for improvement.  If emergency symptoms discussed  during visit developed, seek medical attention immediately.  Followup as needed, or for worsening or persistent symptoms despite treatment.

## 2013-08-16 NOTE — Patient Instructions (Signed)
Cipro 1 pill twice daily for 7 days for UTI.  Increase fluid intake.  Continue to monitor symptoms for improvement.  If emergency symptoms discussed during visit developed, seek medical attention immediately.  Followup as needed, or for worsening or persistent symptoms despite treatment.   Urinary Tract Infection A urinary tract infection (UTI) can occur any place along the urinary tract. The tract includes the kidneys, ureters, bladder, and urethra. A type of germ called bacteria often causes a UTI. UTIs are often helped with antibiotic medicine.  HOME CARE   If given, take antibiotics as told by your doctor. Finish them even if you start to feel better.  Drink enough fluids to keep your pee (urine) clear or pale yellow.  Avoid tea, drinks with caffeine, and bubbly (carbonated) drinks.  Pee often. Avoid holding your pee in for a long time.  Pee before and after having sex (intercourse).  Wipe from front to back after you poop (bowel movement) if you are a woman. Use each tissue only once. GET HELP RIGHT AWAY IF:   You have back pain.  You have lower belly (abdominal) pain.  You have chills.  You feel sick to your stomach (nauseous).  You throw up (vomit).  Your burning or discomfort with peeing does not go away.  You have a fever.  Your symptoms are not better in 3 days. MAKE SURE YOU:   Understand these instructions.  Will watch your condition.  Will get help right away if you are not doing well or get worse. Document Released: 07/23/2007 Document Revised: 10/29/2011 Document Reviewed: 09/04/2011 [facility] Patient Information 2015 [place], [place]. This information is not intended to replace advice given to you by your health care provider. Make sure you discuss any questions you have with your health care provider.

## 2013-08-16 NOTE — Progress Notes (Signed)
Pre visit review using our clinic review tool, if applicable. No additional management support is needed unless otherwise documented below in the visit note. 

## 2013-08-18 LAB — URINE CULTURE: Colony Count: >=100000

## 2013-09-02 ENCOUNTER — Emergency Department (HOSPITAL_BASED_OUTPATIENT_CLINIC_OR_DEPARTMENT_OTHER)
Admission: EM | Admit: 2013-09-02 | Discharge: 2013-09-02 | Disposition: A | Discharge status: Home / Self Care | Payer: 59 | Attending: Emergency Medicine | Admitting: Emergency Medicine

## 2013-09-02 ENCOUNTER — Emergency Department (HOSPITAL_BASED_OUTPATIENT_CLINIC_OR_DEPARTMENT_OTHER): Payer: 59

## 2013-09-02 ENCOUNTER — Encounter (HOSPITAL_BASED_OUTPATIENT_CLINIC_OR_DEPARTMENT_OTHER): Payer: Self-pay | Admitting: Emergency Medicine

## 2013-09-02 DIAGNOSIS — Z79899 Other long term (current) drug therapy: Secondary | ICD-10-CM | POA: Diagnosis not present

## 2013-09-02 DIAGNOSIS — Z23 Encounter for immunization: Secondary | ICD-10-CM | POA: Insufficient documentation

## 2013-09-02 DIAGNOSIS — W1809XA Striking against other object with subsequent fall, initial encounter: Secondary | ICD-10-CM | POA: Insufficient documentation

## 2013-09-02 DIAGNOSIS — Z792 Long term (current) use of antibiotics: Secondary | ICD-10-CM | POA: Diagnosis not present

## 2013-09-02 DIAGNOSIS — W010XXA Fall on same level from slipping, tripping and stumbling without subsequent striking against object, initial encounter: Secondary | ICD-10-CM | POA: Diagnosis not present

## 2013-09-02 DIAGNOSIS — G20A1 Parkinson's disease without dyskinesia, without mention of fluctuations: Secondary | ICD-10-CM | POA: Insufficient documentation

## 2013-09-02 DIAGNOSIS — IMO0002 Reserved for concepts with insufficient information to code with codable children: Secondary | ICD-10-CM | POA: Diagnosis not present

## 2013-09-02 DIAGNOSIS — Y929 Unspecified place or not applicable: Secondary | ICD-10-CM | POA: Insufficient documentation

## 2013-09-02 DIAGNOSIS — Y9301 Activity, walking, marching and hiking: Secondary | ICD-10-CM | POA: Insufficient documentation

## 2013-09-02 DIAGNOSIS — G20 Parkinson's disease: Secondary | ICD-10-CM | POA: Diagnosis not present

## 2013-09-02 DIAGNOSIS — Z87440 Personal history of urinary (tract) infections: Secondary | ICD-10-CM | POA: Insufficient documentation

## 2013-09-02 DIAGNOSIS — Z8679 Personal history of other diseases of the circulatory system: Secondary | ICD-10-CM | POA: Insufficient documentation

## 2013-09-02 DIAGNOSIS — S0990XA Unspecified injury of head, initial encounter: Secondary | ICD-10-CM

## 2013-09-02 MED ORDER — TETANUS-DIPHTH-ACELL PERTUSSIS 5-2.5-18.5 LF-MCG/0.5 IM SUSP
0.5000 mL | Freq: Once | INTRAMUSCULAR | Status: AC
  Administered 2013-09-02: 0.5 mL via INTRAMUSCULAR
  Filled 2013-09-02: qty 0.5
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
## 2013-09-02 NOTE — ED Provider Notes (Signed)
CSN: [id number]     Arrival date & time 09/02/13  1743 History   First MD Initiated Contact with Patient 09/02/13 1757     Chief Complaint  Patient presents with  . Fall     (Consider location/radiation/quality/duration/timing/severity/associated sxs/prior Treatment) HPI Comments: Patient presents to the ER for evaluation of head injury. Patient reports that he tripped over his walker, fell up against a door jam. Patient suffered an abrasion to the right side of his head. He is experiencing mild headache since the fall. Patient has neck and back pain. He denies upper extremity and lower extremity pain and injury. He was ambulatory after the fall.  Patient is a 73 y.o. male presenting with fall.  Fall Associated symptoms include headaches.    Past Medical History  Diagnosis Date  . UTI (urinary tract infection)   . Parkinson disease   . Venous stasis     edema  . Obesity   . Hypothyroidism   . Morbid obesity 07/14/2012   Past Surgical History  Procedure Laterality Date  . Cervical laminectomy    . Foot surgery  1992    right foot repair  . Appendectomy    . Anterior cervical discectomy c6-7     Family History  Problem Relation Age of Onset  . Heart disease Mother   . Hyperlipidemia Mother   . Colon cancer Father   . Alcohol abuse Other    History  Substance Use Topics  . Smoking status: Never Smoker   . Smokeless tobacco: Not on file  . Alcohol Use: No    Review of Systems  Skin: Positive for wound.  Neurological: Positive for headaches. Negative for dizziness and syncope.  All other systems reviewed and are negative.     Allergies  Review of patient's allergies indicates no known allergies.  Home Medications   Prior to Admission medications   Medication Sig Start Date End Date Taking? Authorizing Provider  carbidopa-levodopa-entacapone (STALEVO) 25-100-200 MG per tablet TAKE 1 TABLET BY MOUTH 6 TIMES DAILY 07/25/13   Huston FoleySaima Athar, MD  ciprofloxacin (CIPRO)  500 MG tablet Take 1 tablet (500 mg total) by mouth 2 (two) times daily. 08/16/13   Toniann KetMatthew K Tucker, PA-C  cyanocobalamin 100 MCG tablet Take 100 mcg by mouth daily.      Historical Provider, MD  Multiple Vitamins-Minerals (MENS MULTIVITAMIN PLUS) TABS Take 1 tablet by mouth daily.      Historical Provider, MD  rasagiline (AZILECT) 1 MG TABS tablet Take 1 tablet (1 mg total) by mouth daily. 01/11/13   Huston FoleySaima Athar, MD   BP 127/83  Pulse 67  Temp(Src) 98 F (36.7 C) (Oral)  Resp 16  Ht 6\' 2"  (1.88 m)  Wt 270 lb (122.471 kg)  BMI 34.65 kg/m2  SpO2 98% Physical Exam  Constitutional: He is oriented to person, place, and time. He appears well-developed and well-nourished. No distress.  HENT:  Head: Normocephalic and atraumatic.    Right Ear: Hearing normal. No hemotympanum.  Left Ear: Hearing normal. No hemotympanum.  Nose: Nose normal.  Mouth/Throat: Oropharynx is clear and moist and mucous membranes are normal.  Eyes: Conjunctivae and EOM are normal. Pupils are equal, round, and reactive to light.  Neck: Normal range of motion. Neck supple. No spinous process tenderness and no muscular tenderness present.  Cardiovascular: Regular rhythm, S1 normal and S2 normal.  Exam reveals no gallop and no friction rub.   No murmur heard. Pulmonary/Chest: Effort normal and breath sounds normal. No respiratory  distress. He exhibits no tenderness.  Abdominal: Soft. Normal appearance and bowel sounds are normal. There is no hepatosplenomegaly. There is no tenderness. There is no rebound, no guarding, no tenderness at McBurney's point and negative Murphy's sign. No hernia.  Musculoskeletal: Normal range of motion.       Right hip: Normal.       Left hip: Normal.       Thoracic back: Normal.       Lumbar back: Normal.  Neurological: He is alert and oriented to person, place, and time. He has normal strength. No cranial nerve deficit or sensory deficit. Coordination normal. GCS eye subscore is 4. GCS  verbal subscore is 5. GCS motor subscore is 6.  Skin: Skin is warm, dry and intact. No rash noted. No cyanosis.  Psychiatric: He has a normal mood and affect. His speech is normal and behavior is normal. Thought content normal.    ED Course  Procedures (including critical care time) Labs Review Labs Reviewed - No data to display  Imaging Review Ct Head Wo Contrast  09/02/2013   CLINICAL DATA:  Tripped over his walker at home and fell against a door jam. Right facial abrasion.  EXAM: CT HEAD WITHOUT CONTRAST  TECHNIQUE: Contiguous axial images were obtained from the base of the skull through the vertex without intravenous contrast.  COMPARISON:  MRI brain 01/31/2010, 02/08/2008.  FINDINGS: Moderate cortical, deep and cerebellar atrophy, unchanged. Mild to moderate changes of small vessel disease of the white matter, unchanged. No mass lesion. No midline shift. No acute hemorrhage or hematoma. No extra-axial fluid collections. No evidence of acute infarction.  No skull fracture or other focal osseous abnormality involving the skull. Visualized paranasal sinuses, bilateral mastoid air cells and bilateral middle ear cavities well-aerated. Bilateral carotid siphon and right vertebral artery atherosclerosis.  IMPRESSION: 1. No acute intracranial abnormality. 2. Stable moderate generalized atrophy and mild to moderate chronic microvascular ischemic changes of the white matter.   Electronically Signed   By: Hulan Saas M.D.   On: 09/02/2013 18:22     EKG Interpretation None      MDM   Final diagnoses:  Minor head injury, initial encounter    Presents to the ER for evaluation after a fall. This mechanical fall when he tripped over his walker, hitting the right side of his head on a door frame. There was no loss of consciousness. There was very concerned about the possibility of intracranial injury. CT scan was performed to further evaluate and was negative. Examination showed no evidence of  tenderness or pain in the neck or back region. No other injuries.    Gilda Crease, MD 09/02/13 2104

## 2013-09-02 NOTE — ED Notes (Signed)
Tripped over his walker and fell against the door jam. Abrasion to the right side of his face. No loc.

## 2013-09-09 ENCOUNTER — Other Ambulatory Visit: Payer: Self-pay

## 2013-09-09 MED ORDER — CARBIDOPA-LEVODOPA-ENTACAPONE 25-100-200 MG PO TABS: ORAL_TABLET | ORAL | Status: DC

## 2013-09-09 NOTE — Telephone Encounter (Signed)
Patient has an appt scheduled in January 2016

## 2013-10-21 ENCOUNTER — Telehealth: Payer: Self-pay | Admitting: Neurology

## 2013-10-21 NOTE — Telephone Encounter (Signed)
Called patient's mother and shared Dr Teofilo Pod message below, she  Verbalized understanding and has scheduled sooner appt/confirmed

## 2013-10-21 NOTE — Telephone Encounter (Signed)
Patient's wife wants the letter attention to Alcide Clever and it is for [employer].

## 2013-10-21 NOTE — Telephone Encounter (Signed)
Patient's wife [place] Sink calling to request a letter written by Dr. Frances Furbish for patient stating that he has Parkinson's and needs extra help during the week, patient's wife Spooner Sink wants to come and pick this letter up. Please call and advise.

## 2013-10-21 NOTE — Telephone Encounter (Signed)
Please advise patient or his wife that I would like to see him back in clinic Before providing a letter for him. I have not seen him since November of last year. As I understand, he missed an appointment in May of this year.

## 2013-10-26 ENCOUNTER — Ambulatory Visit (INDEPENDENT_AMBULATORY_CARE_PROVIDER_SITE_OTHER): Payer: 59 | Admitting: Neurology

## 2013-10-26 ENCOUNTER — Telehealth: Payer: Self-pay | Admitting: *Deleted

## 2013-10-26 ENCOUNTER — Encounter: Payer: Self-pay | Admitting: Neurology

## 2013-10-26 ENCOUNTER — Encounter (INDEPENDENT_AMBULATORY_CARE_PROVIDER_SITE_OTHER): Payer: Self-pay

## 2013-10-26 VITALS — BP 120/70 | HR 64 | Temp 96.7°F | Ht 74.0 in | Wt 273.0 lb

## 2013-10-26 DIAGNOSIS — G20A1 Parkinson's disease without dyskinesia, without mention of fluctuations: Secondary | ICD-10-CM

## 2013-10-26 DIAGNOSIS — G20 Parkinson's disease: Secondary | ICD-10-CM

## 2013-10-26 DIAGNOSIS — G4733 Obstructive sleep apnea (adult) (pediatric): Secondary | ICD-10-CM

## 2013-10-26 MED ORDER — CARBIDOPA-LEVODOPA-ENTACAPONE 25-100-200 MG PO TABS: ORAL_TABLET | ORAL | Status: DC

## 2013-10-26 MED ORDER — RASAGILINE MESYLATE 1 MG PO TABS: 1.0000 mg | ORAL_TABLET | Freq: Every day | ORAL | Status: DC

## 2013-10-26 NOTE — Patient Instructions (Addendum)
We will reschedule you for a sleep study.  Use your walker at all times.  We will restart the Azilect.

## 2013-10-26 NOTE — Progress Notes (Signed)
Subjective:    Patient ID: [name] is a 73 y.o. male.  HPI    Interim history:   [name] is a very pleasant 73 year old left-handed gentleman with an underlying medical history of degenerative disc disease, alcoholism in the past, history of syncope, obesity, remote history of pneumonia, status post neck surgery at C6-7, who presents for followup consultation of his advanced right-sided predominant Parkinson's disease, complicated by dyskinesias, and prior medication intolerance. He is accompanied by his wife today. I last saw him on 01/11/13, at which time I counseled him regarding staying within the prescribed amount of Stalevo. I kept him on Stalevo 6 times a day. I suggested adding Azilect. We talked about his prior diagnosis of obstructive sleep apnea in 2008 and I suggested he be retested for sleep apnea and ordered a sleep study. However, he did not come back for the sleep study, as he did not feel like he wanted to do it. Of note, he no showed for an appointment on 07/12/2013, as he did not have a ride.  Today, his wife reports, that she finished NP school and will be starting a new job. She is worried about leaving him alone during the day, and is requesting a letter on behalf of his stepson [name]) to help him transfer through [facility] in [place], [name] to [place], [name], so he can stay with them. He fell on 09/02/13 and hit his head and went to the ER, where he had [place], which I reviewed: 1. No acute intracranial abnormality. 2. Stable moderate generalized atrophy and mild to moderate chronic microvascular ischemic changes of the white matter. He stopped the Azilect, as he did not feel better.    I first met him on 07/14/2012, at which time I felt he was stable and it did not make any medication changes. He requested a letter to excuse him from jury duty which I provided. In the interim, I was notified that he was trying to fill his Stalevo prescription earlier than scheduled and perhaps  overusing Stalevo. The pharmacy note from [facility] stated that he presented there demanding a refill and threatening to go to the emergency room if he would not get his prescription.  He previously followed with [name] and was last seen by him on 02/25/2012, at which time [name] felt that the patient was doing well. He was exercising regularly.  He was diagnosed with right-sided predominant Parkinson's disease in May 1996 by [name]. He was on selegiline 5 mg in the beginning and in 1999 he was started on amantadine. He discontinued his these medications and started Mirapex but had side effects and discontinued it after a month. He started seeing [name] in June 2000 and was re-started on selegiline and amantadine. He was then started on Permax. This was changed and 2005 to Stalevo and Requip was added but discontinued because of excessive daytime somnolence. He has a history of obesity and lost 60 pounds with an exercise program. He has had some memory loss. He had neck surgery in 1996. A sleep study in April 2008 showed hypopneas. In January of this year his MMSE was 29, clock drawing was 4, animal fluency was 13.  He goes to [facility] 3 times a week on M/W/F. He lives with his wife in his own 2 storey home. His bedroom is downstairs. He snores and therefore, sleeps in a separate bedroom. His wife is in school to become a NP. He has  fallen in the past.  I reviewed his sleep study from 05/27/06: AHI was 10.1, based on 62 hypopneas and his REM AHI 27.1/h, his O2 nadir was 78% and he spent 17.6 minutes below the saturation of 88%. It was recommended that he come back for CPAP titration but that never came to fruition for one reason or another. He stated that he was told he did not have to be treated with CPAP.   His Past Medical History Is Significant For: Past Medical History  Diagnosis Date  . UTI (urinary tract infection)   . Parkinson disease   . Venous stasis     edema  . Obesity    . Hypothyroidism   . Morbid obesity 07/14/2012    His Past Surgical History Is Significant For: Past Surgical History  Procedure Laterality Date  . Cervical laminectomy    . Foot surgery  1992    right foot repair  . Appendectomy    . Anterior cervical discectomy c6-7      His Family History Is Significant For: Family History  Problem Relation Age of Onset  . Heart disease Mother   . Hyperlipidemia Mother   . Colon cancer Father   . Alcohol abuse Other   . Cancer Sister     breast     His Social History Is Significant For: History   Social History  . Marital Status: Married    Spouse Name: [name]    Number of Children: 3  . Years of Education: college   Occupational History  .     Social History Main Topics  . Smoking status: Never Smoker   . Smokeless tobacco: Never Used  . Alcohol Use: No  . Drug Use: No  . Sexual Activity: None   Other Topics Concern  . None   Social History Narrative   Regular exercise-No   Retired,is left handed and resides with wife    His Allergies Are:  No Known Allergies:   His Current Medications Are:  Outpatient Encounter Prescriptions as of 10/26/2013  Medication Sig  . carbidopa-levodopa-entacapone (STALEVO) 25-100-200 MG per tablet TAKE 1 TABLET BY MOUTH 6 TIMES DAILY  . cyanocobalamin 100 MCG tablet Take 100 mcg by mouth daily.    . Multiple Vitamins-Minerals (MENS MULTIVITAMIN PLUS) TABS Take 1 tablet by mouth daily.    . vitamin E 1000 UNIT capsule Take 1,000 Units by mouth daily.  . [DISCONTINUED] ciprofloxacin (CIPRO) 500 MG tablet Take 1 tablet (500 mg total) by mouth 2 (two) times daily.  . [DISCONTINUED] rasagiline (AZILECT) 1 MG TABS tablet Take 1 tablet (1 mg total) by mouth daily.  :  Review of Systems:  Out of a complete 14 point review of systems, all are reviewed and negative with the exception of these symptoms as listed below:   Review of Systems  Musculoskeletal:       Walking difficulty   Neurological: Positive for weakness.    Objective:  Neurologic Exam  Physical Exam Physical Examination:   Filed Vitals:   10/26/13 1020  BP: 120/70  Pulse: 64  Temp: 96.7 F (35.9 C)   General Examination: The patient is a very pleasant 73 y.o. male in no acute distress. He is morbidly obese, but has lost weight since his sleep study (was 300 lb at the time) but gained since his last visit (was 268 lb at the time).   HEENT: Normocephalic, atraumatic, pupils are equal, round and reactive to light and accommodation. Extraocular tracking shows  mild saccadic breakdown without nystagmus noted. There is limitation to upper gaze. There is mild decrease in eye blink rate. Hearing is intact. Face is symmetric with moderate facial masking and mouth is open most of the time with normal facial sensation and mild facial dyskinesias. There is no lip, neck or jaw tremor. Neck is moderately rigid with intact passive ROM. There are no carotid bruits on auscultation. Oropharynx exam reveals no significant mouth dryness. No significant airway crowding is noted. Mallampati is class II. Tongue protrudes centrally and palate elevates symmetrically. There is mild drooling, unchanged.   Chest: is clear to auscultation without wheezing, rhonchi or crackles noted.  Heart: sounds are regular and normal without murmurs, rubs or gallops noted.   Abdomen: is soft, non-tender and non-distended with normal bowel sounds appreciated on auscultation.  Extremities: There is trace pitting edema in the distal lower extremities bilaterally. Chronic stasis-like changes are noted in the distal legs bilaterally. There are no varicose veins, but spider veins are present unchanged.  Skin: is warm and dry with no trophic changes noted. Age-related changes are noted on the skin.   Musculoskeletal: exam reveals no obvious joint deformities, tenderness, joint swelling or erythema.  Neurologically:   Mental status: The patient  is awake and alert, paying good  attention. He is able to completely provide the history and his wife provides details. He is oriented to: person, place, situation, day of week, month of year and year. His memory, attention, language and knowledge are fairly well intact. There is no aphasia, agnosia, apraxia or anomia. There is a no significant degree of bradyphrenia. Speech is moderately hypophonic with moderate dysarthria noted. Mood is congruent and affect is normal.   Cranial nerves are as described above under HEENT exam. In addition, shoulder shrug is normal with equal shoulder height noted.  Motor exam: Normal bulk, and strength for age is noted. There are mild generalized dyskinesias noted. Tone is mildly rigid with presence of cogwheeling in the bilateral upper extremities. There is overall moderate bradykinesia. There is no drift or rebound.  There is no tremor. Romberg is negative.  Reflexes are trace in the upper extremities and absent in the lower extremities.   Fine motor skills exam: Finger taps are moderately impaired on the right and mildly impaired on the left. Hand movements are moderately impaired on the right and mildly impaired on the left. RAP (rapid alternating patting) is moderately impaired on the right and mildly impaired on the left. Foot taps are moderately impaired on the right and mildly impaired on the left. Foot agility (in the form of heel stomping) is moderately impaired on the right and mildly impaired on the left.    Cerebellar testing shows no dysmetria or intention tremor on finger to nose testing. There is no truncal or gait ataxia.   Sensory exam is intact to light touch, pinprick, vibration, temperature sense in the upper and lower extremities.   Gait, station and balance: He stands up from the seated position with mild difficulty and does need to push up with His hands. He needs no assistance. No veering to one side is noted. He is noted to lean to the right.  Posture is mild to moderately stooped. Stance is wide-based. He walks with decrease in stride length and pace and decreased arm swing and more pronounced dyskinesias in his upper extremities. He turns in 5 steps. He walks with a waddle. Tandem walk is not possible. Balance is mild to moderately impaired. He is  not able to do a toe or heel stance.    He brought his 4 wheeled walker with seat.  Assessment and Plan:   In summary, [name] is a very pleasant 73 year old male with a history of advanced Parkinson's disease, right-sided predominant, complicated by OSA (currently not treated), dyskinesias and some medication intolerance in the past. He has been on amantadine, Permax, Mirapex, selegiline, and Requip in the past. He has been reasonably stable. He has at times taken more than prescribed Stalevo, but there is no evidenced of this recently. He is a recovering alcoholic and is in [place]. I would like for him to stay at the same dose of Stalevo namely 6 times a day at 4 hourly intervals. As an adjunct I would like for him to start re-Azilect. I explained to him that I am not surprised that he did not feel any significant improvement after adding Azilect as it is not a very strong drug but since he tolerated it I would like for him to continue Azilect as it can help as an adjunct long-term and is usually well-tolerated and works differently from levodopa. He will restart at 0.5 mg once daily for 2 weeks then 1 mg daily thereafter. He has prior diagnosis of obstructive sleep apnea and has never been treated and did not schedule his sleep study which I suggested in November 2014. He is willing to get retested and consider treatment if the need arises.To that end I placed an order for a sleep study again today. We also talked about DBS surgery again today. He has had more falls recently and I wonder if he would be a candidate for this and I explained to his wife in particular that significant balance problems  and falls make for a less good candidacy for DBS.  He has fallen recently. He is advised to use his walker at all times.In addition, he needs to be a good surgical candidate in general. He should continue to work on weight loss and if he has sleep apnea he needs to start treatment for this. I would like to see him back after his sleep studies completed. I refilled his medications, Stalevo and Azilect today. I encouraged them to call with any interim questions, concerns, problems or updates and refill requests. his wife is requesting a letter on behalf of her son so he can try to relocate to [facility] from [place] to be able to stay with them as she will be starting a new job and will be away during the day and does not feel comfortable leaving him alone for most of the day. I agree and I will be happy to provide a letter.

## 2013-10-26 NOTE — Telephone Encounter (Signed)
No show appt

## 2013-10-28 ENCOUNTER — Ambulatory Visit: Payer: 59

## 2014-01-11 ENCOUNTER — Telehealth: Payer: Self-pay | Admitting: Neurology

## 2014-01-11 NOTE — Telephone Encounter (Signed)
Confirmed appointment time change with patient for 03/07/14

## 2014-02-07 ENCOUNTER — Ambulatory Visit: Payer: Medicare Other

## 2014-03-07 ENCOUNTER — Ambulatory Visit: Payer: Self-pay | Admitting: Neurology

## 2014-03-27 ENCOUNTER — Other Ambulatory Visit: Payer: Self-pay | Admitting: Neurology

## 2014-03-27 NOTE — Telephone Encounter (Signed)
Patient has appt scheduled

## 2014-05-05 ENCOUNTER — Ambulatory Visit: Payer: Medicare Other | Admitting: Family Medicine

## 2014-05-15 ENCOUNTER — Ambulatory Visit: Payer: 59 | Admitting: Neurology

## 2014-05-22 ENCOUNTER — Encounter: Payer: Self-pay | Admitting: Neurology

## 2014-05-22 ENCOUNTER — Ambulatory Visit (INDEPENDENT_AMBULATORY_CARE_PROVIDER_SITE_OTHER): Payer: PPO | Admitting: Neurology

## 2014-05-22 VITALS — BP 122/68 | HR 66 | Resp 16 | Ht 74.0 in | Wt 270.0 lb

## 2014-05-22 DIAGNOSIS — G20 Parkinson's disease: Secondary | ICD-10-CM | POA: Diagnosis not present

## 2014-05-22 MED ORDER — CARBIDOPA-LEVODOPA-ENTACAPONE 25-100-200 MG PO TABS: ORAL_TABLET | ORAL | Status: DC

## 2014-05-22 MED ORDER — RASAGILINE MESYLATE 1 MG PO TABS: 1.0000 mg | ORAL_TABLET | Freq: Every day | ORAL | Status: DC

## 2014-05-22 NOTE — Progress Notes (Signed)
Subjective:    Patient ID: [name] is a 74 y.o. male.  HPI     Interim history:   Mr. [name] is a very pleasant 74 year old left-handed gentleman with an underlying medical history of degenerative disc disease, alcoholism in the past, history of syncope, obesity, remote history of pneumonia, status post neck surgery at C6-7, who presents for followup consultation of his advanced right-sided predominant Parkinson's disease, complicated by dyskinesias, and prior medication intolerance. He is unaccompanied today. I last saw him on 10/26/2013, at which time his wife reported a fall in July. She was supposed to start a new job and was worried about leaving him alone during the day and requested a letter from me on behalf of his [name], [name], to help him transfer through [place] in [place], [name] to [place], [name], so he could stay with them. I kept him on Stalevo. I restarted him on Azilect as an adjunct. Of note, he canceled an appointment with me on 03/07/2014.   Today, 05/22/2014: He is able to provide his own history and he drove himself to this appointment. He states that he has not fallen recently. He uses his cane usually after 5 PM when he becomes a little bit more dyskinetic. He has not had any new symptoms. His memory and mood are stable. His wife completed her training for nurse practitioner. His stepson still lives in [place]. He avoids driving at [place] and [place]. He feels his symptoms are stable. He had blood work through his primary care physician recently. He had no medication changes.  He goes to the gym to exercise. He drinks water and does not drink much in the way of caffeine. He drinks 1 cup of coffee in the morning. His weight has been stable.   Previously:  I saw him on 01/11/13, at which time I counseled him regarding staying within the prescribed amount of Stalevo. I kept him on Stalevo 6 times a day. I suggested adding Azilect. We talked about his prior  diagnosis of obstructive sleep apnea in 2008 and I suggested he be retested for sleep apnea and ordered a sleep study. However, he did not come back for the sleep study, as he did not feel like he wanted to do it. Of note, he no showed for an appointment on 07/12/2013, as he did not have a ride.    He fell on 09/02/13 and hit his head and went to the ER, where he had [place], which I reviewed: 1. No acute intracranial abnormality. 2. Stable moderate generalized atrophy and mild to moderate chronic microvascular ischemic changes of the white matter. He stopped the Azilect, as he did not feel better.      I first met him on 07/14/2012, at which time I felt he was stable and it did not make any medication changes. He requested a letter to excuse him from jury duty which I provided. In the interim, I was notified that he was trying to fill his Stalevo prescription earlier than scheduled and perhaps overusing Stalevo. The pharmacy note from [facility] long pharmacy stated that he presented there demanding a refill and threatening to go to the emergency room if he would not get his prescription.    He previously followed with Dr. Morene Antu and was last seen by him on 02/25/2012, at which time Dr. Erling Cruz felt that the patient was doing well. He was exercising regularly.   He was diagnosed with right-sided predominant Parkinson's disease in  May 1996 by Dr. Morrell Riddle. He was on selegiline 5 mg in the beginning and in 1999 he was started on amantadine. He discontinued his these medications and started Mirapex but had side effects and discontinued it after a month. He started seeing Dr. Erling Cruz in June 2000 and was re-started on selegiline and amantadine. He was then started on Permax. This was changed and 2005 to Stalevo and Requip was added but discontinued because of excessive daytime somnolence. He has a history of obesity and lost 60 pounds with an exercise program. He has had some memory loss. He had neck surgery in 1996. A  sleep study in April 2008 showed hypopneas. In January of this year his MMSE was 29, clock drawing was 4, animal fluency was 13.   He goes to [employer] 3 times a week on M/W/F. He lives with his wife in his own 2 storey home. His bedroom is downstairs. He snores and therefore, sleeps in a separate bedroom. His wife is in school to become a NP. He has fallen in the past.   I reviewed his sleep study from 05/27/06: AHI was 10.1, based on 62 hypopneas and his REM AHI 27.1/h, his O2 nadir was 78% and he spent 17.6 minutes below the saturation of 88%. It was recommended that he come back for CPAP titration but that never came to fruition for one reason or another. He stated that he was told he did not have to be treated with CPAP.   His Past Medical History Is Significant For: Past Medical History  Diagnosis Date  . UTI (urinary tract infection)   . Parkinson disease   . Venous stasis     edema  . Obesity   . Hypothyroidism   . Morbid obesity 07/14/2012    His Past Surgical History Is Significant For: Past Surgical History  Procedure Laterality Date  . Cervical laminectomy    . Foot surgery  1992    right foot repair  . Appendectomy    . Anterior cervical discectomy c6-7      His Family History Is Significant For: Family History  Problem Relation Age of Onset  . Heart disease Mother   . Hyperlipidemia Mother   . Colon cancer Father   . Alcohol abuse Other   . Cancer Sister     breast     His Social History Is Significant For: History   Social History  . Marital Status: Married    Spouse Name: [name]  . Number of Children: 3  . Years of Education: college   Occupational History  .     Social History Main Topics  . Smoking status: Former Smoker -- 2 years    Types: Cigarettes    Quit date: 03/26/1992  . Smokeless tobacco: Never Used  . Alcohol Use: No  . Drug Use: No  . Sexual Activity: Not on file   Other Topics Concern  . None   Social History Narrative   Regular  exercise-No   Retired,is left handed and resides with wife    His Allergies Are:  No Known Allergies:   His Current Medications Are:  Outpatient Encounter Prescriptions as of 05/22/2014  Medication Sig  . carbidopa-levodopa-entacapone (STALEVO) 25-100-200 MG per tablet TAKE 1 TABLET BY MOUTH 6 TIMES DAILY  . carbidopa-levodopa-entacapone (STALEVO) 25-100-200 MG per tablet TAKE 1 TABLET BY MOUTH 6 TIMES DAILY  . cyanocobalamin 100 MCG tablet Take 100 mcg by mouth daily.    . Multiple Vitamins-Minerals (MENS MULTIVITAMIN  PLUS) TABS Take 1 tablet by mouth daily.    . rasagiline (AZILECT) 1 MG TABS tablet Take 1 tablet (1 mg total) by mouth daily. Take 1/2 pill daily for 2 weeks.  . vitamin E 1000 UNIT capsule Take 1,000 Units by mouth daily.  :  Review of Systems:  Out of a complete 14 point review of systems, all are reviewed and negative with the exception of these symptoms as listed below:   Review of Systems  All other systems reviewed and are negative.   Objective:  Neurologic Exam  Physical Exam Physical Examination:   Filed Vitals:   05/22/14 0830  BP: 122/68  Pulse: 66  Resp: 16    General Examination: The patient is a very pleasant 74 y.o. male in no acute distress. He is morbidly obese, but has lost weight since his sleep study (was 300 lb at the time) and is 270 lb today.   HEENT: Normocephalic, atraumatic, pupils are equal, round and reactive to light and accommodation. Extraocular tracking shows mild saccadic breakdown without nystagmus noted. There is limitation to upper gaze. There is mild decrease in eye blink rate. Hearing is intact. Face is symmetric with moderate facial masking and mouth is open most of the time with normal facial sensation and mild facial dyskinesias. There is no lip, neck or jaw tremor. Neck is moderately rigid with intact passive ROM. There are no carotid bruits on auscultation. Oropharynx exam reveals no significant mouth dryness. No  significant airway crowding is noted. Mallampati is class II. Tongue protrudes centrally and palate elevates symmetrically. There is mild drooling, unchanged.   Chest: is clear to auscultation without wheezing, rhonchi or crackles noted.  Heart: sounds are regular and normal without murmurs, rubs or gallops noted.   Abdomen: is soft, non-tender and non-distended with normal bowel sounds appreciated on auscultation.  Extremities: There is trace pitting edema in the distal lower extremities bilaterally, better. Chronic stasis-like changes are noted in the distal legs bilaterally. There are no varicose veins, but spider veins are present and unchanged.  Skin: is warm and dry with no trophic changes noted. Age-related changes are noted on the skin.   Musculoskeletal: exam reveals no obvious joint deformities, tenderness, joint swelling or erythema.  Neurologically:   Mental status: The patient is awake and alert, paying good  attention. He is able to completely provide the history. He is oriented to: person, place, situation, day of week, month of year and year. His memory, attention, language and knowledge are fairly well intact. There is no aphasia, agnosia, apraxia or anomia. There is a no significant degree of bradyphrenia. Speech is moderately hypophonic with mild dysarthria noted. Mood is congruent and affect is normal.   Cranial nerves are as described above under HEENT exam. In addition, shoulder shrug is normal with equal shoulder height noted.  Motor exam: Normal bulk, and strength for age is noted. There are mild generalized dyskinesias noted. Tone is mildly rigid with presence of cogwheeling in the bilateral upper extremities. There is overall moderate bradykinesia. There is no drift or rebound.  There is no tremor. Romberg is negative.  Reflexes are trace in the upper extremities and absent in the lower extremities.   Fine motor skills exam: Finger taps are moderately impaired on the  right and mild to moderately impaired on the left. Hand movements are moderately impaired on the right and mildly impaired on the left. RAP (rapid alternating patting) is moderately impaired on the right and mildly impaired  on the left. Foot taps are moderately impaired on the right and moderately impaired on the left. Foot agility (in the form of heel stomping) is moderately impaired on the right and mildly impaired on the left.    Cerebellar testing shows no dysmetria or intention tremor on finger to nose testing. There is no truncal or gait ataxia.   Sensory exam is intact to light touch, pinprick, vibration, temperature sense in the upper and lower extremities.   Gait, station and balance: He stands up from the seated position with mild difficulty and does need to push up with His hands. He needs no assistance. No veering to one side is noted. He is noted to lean to the right. Posture is mild to moderately stooped. Stance is wide-based. He walks with decrease in stride length and pace and decreased arm swing and more pronounced dyskinesias in his upper extremities. He turns in 5 steps. He walks with a waddle. Tandem walk is not possible. Balance is mild to moderately impaired. He is not able to do a toe or heel stance. He has no walking aid today.   Assessment and Plan:   In summary, STRAN RAPER is a very pleasant 74 year old male with a history of advanced Parkinson's disease, right-sided predominant, complicated by OSA (currently not treated), dyskinesias and some medication intolerance in the past. He has been tried on amantadine, Permax, Mirapex, selegiline, and Requip in the past. He has been reasonably stable. He has at times taken more than prescribed Stalevo, but there is no evidenced of this recently. He is a recovering alcoholic and is in [place]. I would like for him to stay at the same dose of Stalevo namely 6 times a day at 4 hourly intervals. He is also advised to continue with Azilect 1 mg  once daily. He has had some falls in the past but not recently. He is advised to continue to try to lose weight. I renewed his prescriptions today. I would like to see him back in 6 months, sooner if the need arises. I answered all his questions today and he was in agreement. I spent 25 minutes in total face-to-face time with the patient, more than 50% of which was spent in counseling and coordination of care, reviewing test results, reviewing medication and discussing or reviewing the diagnosis of PD, its prognosis and treatment options.

## 2014-08-10 ENCOUNTER — Emergency Department (HOSPITAL_COMMUNITY): Payer: PPO

## 2014-08-10 ENCOUNTER — Observation Stay (HOSPITAL_COMMUNITY): Payer: PPO

## 2014-08-10 ENCOUNTER — Encounter (HOSPITAL_COMMUNITY): Payer: Self-pay

## 2014-08-10 ENCOUNTER — Observation Stay (HOSPITAL_COMMUNITY)
Admission: EM | Admit: 2014-08-10 | Discharge: 2014-08-11 | Disposition: A | Discharge status: Home / Self Care | Payer: PPO | Attending: Internal Medicine | Admitting: Internal Medicine

## 2014-08-10 ENCOUNTER — Telehealth: Payer: Self-pay | Admitting: Neurology

## 2014-08-10 DIAGNOSIS — I639 Cerebral infarction, unspecified: Secondary | ICD-10-CM

## 2014-08-10 DIAGNOSIS — K4090 Unilateral inguinal hernia, without obstruction or gangrene, not specified as recurrent: Secondary | ICD-10-CM | POA: Insufficient documentation

## 2014-08-10 DIAGNOSIS — Z6835 Body mass index (BMI) 35.0-35.9, adult: Secondary | ICD-10-CM | POA: Diagnosis not present

## 2014-08-10 DIAGNOSIS — Z87440 Personal history of urinary (tract) infections: Secondary | ICD-10-CM | POA: Insufficient documentation

## 2014-08-10 DIAGNOSIS — R296 Repeated falls: Secondary | ICD-10-CM | POA: Diagnosis present

## 2014-08-10 DIAGNOSIS — R911 Solitary pulmonary nodule: Secondary | ICD-10-CM | POA: Insufficient documentation

## 2014-08-10 DIAGNOSIS — Z87891 Personal history of nicotine dependence: Secondary | ICD-10-CM | POA: Diagnosis not present

## 2014-08-10 DIAGNOSIS — E039 Hypothyroidism, unspecified: Secondary | ICD-10-CM | POA: Diagnosis not present

## 2014-08-10 DIAGNOSIS — R531 Weakness: Secondary | ICD-10-CM | POA: Diagnosis present

## 2014-08-10 DIAGNOSIS — I10 Essential (primary) hypertension: Secondary | ICD-10-CM | POA: Insufficient documentation

## 2014-08-10 DIAGNOSIS — G20A1 Parkinson's disease without dyskinesia, without mention of fluctuations: Secondary | ICD-10-CM | POA: Insufficient documentation

## 2014-08-10 DIAGNOSIS — W19XXXA Unspecified fall, initial encounter: Secondary | ICD-10-CM

## 2014-08-10 DIAGNOSIS — G20 Parkinson's disease: Principal | ICD-10-CM | POA: Insufficient documentation

## 2014-08-10 LAB — URINALYSIS, ROUTINE W REFLEX MICROSCOPIC
BILIRUBIN URINE: NEGATIVE
Glucose, UA: NEGATIVE mg/dL
Hgb urine dipstick: NEGATIVE
KETONES UR: 15 mg/dL — AB
Leukocytes, UA: NEGATIVE
Nitrite: NEGATIVE
PH: 5.5 (ref 5.0–8.0)
Protein, ur: NEGATIVE mg/dL
Specific Gravity, Urine: 1.028 (ref 1.005–1.030)
Urobilinogen, UA: 0.2 mg/dL (ref 0.0–1.0)

## 2014-08-10 LAB — BASIC METABOLIC PANEL
Anion gap: 7 (ref 5–15)
BUN: 23 mg/dL — AB (ref 6–20)
CO2: 24 mmol/L (ref 22–32)
Calcium: 8.6 mg/dL — ABNORMAL LOW (ref 8.9–10.3)
Chloride: 105 mmol/L (ref 101–111)
Creatinine, Ser: 0.93 mg/dL (ref 0.61–1.24)
GFR calc Af Amer: >60 mL/min (ref >60)
GFR calc non Af Amer: >60 mL/min (ref >60)
Glucose, Bld: 97 mg/dL (ref 65–99)
POTASSIUM: 4.7 mmol/L (ref 3.5–5.1)
SODIUM: 136 mmol/L (ref 135–145)

## 2014-08-10 LAB — CBC
HCT: 41.3 % (ref 39.0–52.0)
Hemoglobin: 13.9 g/dL (ref 13.0–17.0)
MCH: 29.3 pg (ref 26.0–34.0)
MCHC: 33.7 g/dL (ref 30.0–36.0)
MCV: 86.9 fL (ref 78.0–100.0)
Platelets: 139 10*3/uL — ABNORMAL LOW (ref 150–400)
RBC: 4.75 MIL/uL (ref 4.22–5.81)
RDW: 12.8 % (ref 11.5–15.5)
WBC: 7.7 10*3/uL (ref 4.0–10.5)

## 2014-08-10 LAB — I-STAT TROPONIN, ED: TROPONIN I, POC: 0.02 ng/mL (ref 0.00–0.08)

## 2014-08-10 MED ORDER — CARBIDOPA-LEVODOPA 25-100 MG PO TABS
1.0000 | ORAL_TABLET | Freq: Every day | ORAL | Status: DC
  Administered 2014-08-10 – 2014-08-11 (×4): 1 via ORAL
  Filled 2014-08-10 (×12): qty 1

## 2014-08-10 MED ORDER — ALUM & MAG HYDROXIDE-SIMETH 200-200-20 MG/5ML PO SUSP: 30.0000 mL | Freq: Four times a day (QID) | ORAL | Status: DC | PRN

## 2014-08-10 MED ORDER — ENTACAPONE 200 MG PO TABS
200.0000 mg | ORAL_TABLET | Freq: Every day | ORAL | Status: DC
  Administered 2014-08-10 – 2014-08-11 (×4): 200 mg via ORAL
  Filled 2014-08-10 (×12): qty 1

## 2014-08-10 MED ORDER — ONDANSETRON HCL 4 MG/2ML IJ SOLN: 4.0000 mg | Freq: Four times a day (QID) | INTRAMUSCULAR | Status: DC | PRN

## 2014-08-10 MED ORDER — SODIUM CHLORIDE 0.9 % IJ SOLN
3.0000 mL | Freq: Two times a day (BID) | INTRAMUSCULAR | Status: DC
  Administered 2014-08-10: 3 mL via INTRAVENOUS

## 2014-08-10 MED ORDER — ENOXAPARIN SODIUM 40 MG/0.4ML ~~LOC~~ SOLN
40.0000 mg | SUBCUTANEOUS | Status: DC
  Administered 2014-08-10: 40 mg via SUBCUTANEOUS
  Filled 2014-08-10 (×2): qty 0.4

## 2014-08-10 MED ORDER — RASAGILINE MESYLATE 1 MG PO TABS
1.0000 mg | ORAL_TABLET | Freq: Every day | ORAL | Status: DC
Start: 2014-08-10 — End: 2014-08-11
  Administered 2014-08-11: 1 mg via ORAL
  Filled 2014-08-10 (×4): qty 1

## 2014-08-10 MED ORDER — SODIUM CHLORIDE 0.9 % IV SOLN
INTRAVENOUS | Status: DC
  Administered 2014-08-10: 22:00:00 via INTRAVENOUS

## 2014-08-10 MED ORDER — ONDANSETRON HCL 4 MG PO TABS: 4.0000 mg | ORAL_TABLET | Freq: Four times a day (QID) | ORAL | Status: DC | PRN

## 2014-08-10 MED ORDER — CARBIDOPA-LEVODOPA-ENTACAPONE 25-100-200 MG PO TABS: 1.0000 | ORAL_TABLET | Freq: Every day | ORAL | Status: DC

## 2014-08-10 MED ORDER — ACETAMINOPHEN 325 MG PO TABS: 650.0000 mg | ORAL_TABLET | Freq: Four times a day (QID) | ORAL | Status: DC | PRN

## 2014-08-10 MED ORDER — ACETAMINOPHEN 650 MG RE SUPP: 650.0000 mg | Freq: Four times a day (QID) | RECTAL | Status: DC | PRN

## 2014-08-10 NOTE — ED Notes (Signed)
Patient transported to X-ray 

## 2014-08-10 NOTE — ED Notes (Addendum)
Per EMS - pt fell at home, was unable to get up from ground d/t weakness in leg. Hypertensive. Hx Parkinson's and increased falls more recently. Denies LOC or hitting head when falling.

## 2014-08-10 NOTE — ED Notes (Signed)
Pt transported to MRI after xrays.

## 2014-08-10 NOTE — ED Notes (Signed)
Pt transporting to CT  

## 2014-08-10 NOTE — Telephone Encounter (Signed)
Patient's wife is calling to let us know that the patient is on his way to the ER with elevated BP and his left upper thigh is painful from a fall.

## 2014-08-10 NOTE — ED Notes (Addendum)
Pts wife came out of the room and was asking what the pts BUN and creatine were. The pts wife was informed that pt information could not be given out as it was a HIPAA violation. The pts wife stated "I am a nurse practitioner and no it is not!".   The pts wife then came out of the room again asking for further information and was again told that information could not be given out.

## 2014-08-10 NOTE — ED Provider Notes (Signed)
CSN: [id number]     Arrival date & time 08/10/14  1435 History   First MD Initiated Contact with Patient 08/10/14 1445     Chief Complaint  Patient presents with  . Fall     (Consider location/radiation/quality/duration/timing/severity/associated sxs/prior Treatment) HPI Comments: The patient is a 74 year old male, he has a history of obesity and Parkinson's disease, he is having more frequent falls recently, complaining of some left  leg weakness. Today he had a fall, he was unable to get a hold of friends in the community, he finally called 911 who had help him get off the ground. According to the patient and his spouse that is not unusual for him to need assistance to get off the ground. the patient states that other than his left hip feeling weak he has no other new complaints. His significant other states that he is rather stoic and does not like to ask for help from her, he does not want to have someone come into the house to help out. He denies headache, fever, chills, nausea, vomiting, diarrhea, has had a normal appetite.  Patient is a 74 y.o. male presenting with fall. The history is provided by the patient, the spouse and the EMS personnel.  Fall    Past Medical History  Diagnosis Date  . UTI (urinary tract infection)   . Parkinson disease   . Venous stasis     edema  . Obesity   . Hypothyroidism   . Morbid obesity 07/14/2012   Past Surgical History  Procedure Laterality Date  . Cervical laminectomy    . Foot surgery  1992    right foot repair  . Appendectomy    . Anterior cervical discectomy c6-7     Family History  Problem Relation Age of Onset  . Heart disease Mother   . Hyperlipidemia Mother   . Colon cancer Father   . Alcohol abuse Other   . Cancer Sister     breast    History  Substance Use Topics  . Smoking status: Former Smoker -- 2 years    Types: Cigarettes    Quit date: 03/26/1992  . Smokeless tobacco: Never Used  . Alcohol Use: No    Review of  Systems  All other systems reviewed and are negative.     Allergies  Review of patient's allergies indicates no known allergies.  Home Medications   Prior to Admission medications   Medication Sig Start Date End Date Taking? Authorizing Provider  calcium carbonate (TUMS - DOSED IN MG ELEMENTAL CALCIUM) 500 MG chewable tablet Chew 1-2 tablets by mouth 3 (three) times daily.   Yes Historical Provider, MD  carbidopa-levodopa-entacapone (STALEVO) 25-100-200 MG per tablet TAKE 1 TABLET BY MOUTH 6 TIMES DAILY 10/26/13  Yes Huston Foley, MD  ibuprofen (ADVIL,MOTRIN) 200 MG tablet Take 400 mg by mouth every 6 (six) hours as needed for moderate pain.   Yes Historical Provider, MD  carbidopa-levodopa-entacapone (STALEVO) 25-100-200 MG per tablet TAKE 1 TABLET BY MOUTH 6 TIMES DAILY 05/22/14   Huston Foley, MD  rasagiline (AZILECT) 1 MG TABS tablet Take 1 tablet (1 mg total) by mouth daily. 05/22/14   Huston Foley, MD   BP 135/67 mmHg  Pulse 57  Temp(Src) 98.5 F (36.9 C) (Oral)  Resp 23  SpO2 100% Physical Exam  Constitutional: He appears well-developed and well-nourished. No distress.  HENT:  Head: Normocephalic and atraumatic.  Mouth/Throat: Oropharynx is clear and moist. No oropharyngeal exudate.  Eyes: Conjunctivae and EOM  are normal. Pupils are equal, round, and reactive to light. Right eye exhibits no discharge. Left eye exhibits no discharge. No scleral icterus.  Neck: Normal range of motion. Neck supple. No JVD present. No thyromegaly present.  Cardiovascular: Normal rate, regular rhythm, normal heart sounds and intact distal pulses.  Exam reveals no gallop and no friction rub.   No murmur heard. Pulmonary/Chest: Effort normal and breath sounds normal. No respiratory distress. He has no wheezes. He has no rales.  Abdominal: Soft. Bowel sounds are normal. He exhibits no distension and no mass. There is no tenderness.  Musculoskeletal: Normal range of motion. He exhibits no edema or  tenderness.  Lymphadenopathy:    He has no cervical adenopathy.  Neurological: He is alert. Coordination normal.  Slight right-sided facial droop, baseline tremor, normal strength in all 4 extremities except for the left lower extremity which she has some weakness with raising the leg off the bed. Normal hamstring function, normal extension at the hip.  Skin: Skin is warm and dry. No rash noted. No erythema.  Psychiatric: He has a normal mood and affect. His behavior is normal.  Nursing note and vitals reviewed.   ED Course  Procedures (including critical care time) Labs Review Labs Reviewed  CBC - Abnormal; Notable for the following:    Platelets 139 (*)    All other components within normal limits  BASIC METABOLIC PANEL  I-STAT TROPOININ, ED    Imaging Review Ct Head Wo Contrast  08/10/2014   CLINICAL DATA:  74 year old male status post fall. History of Parkinson's and hypertension.  EXAM: CT HEAD WITHOUT CONTRAST  TECHNIQUE: Contiguous axial images were obtained from the base of the skull through the vertex without intravenous contrast.  COMPARISON:  Head CT dated 09/02/2013  FINDINGS: There is slight prominence of the ventricles and sulci compatible with age-related volume loss. Mild periventricular and deep white matter hypodensities represent chronic microvascular ischemic changes. There is no intracranial hemorrhage. No mass effect or midline shift identified.  The visualized paranasal sinuses and mastoid air cells are well aerated. The calvarium is intact.  IMPRESSION: No acute intracranial pathology.  Mild age-related atrophy and chronic microvascular ischemic disease.  If symptoms persist and there are no contraindications, MRI may provide better evaluation if clinically indicated   Electronically Signed   By: Elgie Collard M.D.   On: 08/10/2014 15:36   Dg Chest Port 1 View  08/10/2014   CLINICAL DATA:  Weakness after fall earlier today  EXAM: PORTABLE CHEST - 1 VIEW   COMPARISON:  February 03, 2010  FINDINGS: In comparison with the prior study, there is a new 7 x 5 mm nodular opacity in the right upper lobe near the apex. Lungs elsewhere clear. Heart size and pulmonary vascularity are normal. No adenopathy. No bone lesions. No pneumothorax.  IMPRESSION: 7 x 5 mm nodular opacity in the periphery of the right upper lobe near the apex. Advise followup chest radiograph in 3 months to assess for stability. Elsewhere lungs are clear.   Electronically Signed   By: Bretta Bang III M.D.   On: 08/10/2014 15:12     EKG Interpretation   Date/Time:  Thursday August 10 2014 14:47:39 EDT Ventricular Rate:  64 PR Interval:  150 QRS Duration: 96 QT Interval:  419 QTC Calculation: 432 R Axis:   20 Text Interpretation:  Sinus rhythm Ventricular premature complex Since  last tracing rate slower Confirmed by Nicole Defino  MD, Lamaria Hildebrandt ([id number]) on  08/10/2014 3:07:32 PM  MDM   Final diagnoses:  Weakness  Weakness    No signs of trauma, he has some weakness of the left lower extremity, there is some right facial droop, his significant other states that most of this is baseline though he is not usually that we can the leg. The patient states that he is recently. This may all be coordination and progressive parkinsonian is up, less likely to be code stroke, not activated, the patient will be evaluated with CT scan, labs, EKG.  D/w Dr. Cyril Mourning who will see the pt in the ED for constulation re: leg weakness  Change of shift - care signed out to Dr. Lynelle Doctor to follow up results, reevaluate and disposition accordingly.    Eber Hong, MD 08/10/14 1620

## 2014-08-10 NOTE — Telephone Encounter (Signed)
[facility] called to say patient is in ED. He is currently being evaluated; PVCs noted, L leg weakness, and some R facial droop. [facility] Sink will call back after patient is out of hospital. She would also like to talk to Dr. Frances Furbish about possibly getting patient into a nursing facility. She does not think that patient can be at home alone any longer.

## 2014-08-10 NOTE — Consult Note (Signed)
Referring Physician: Hyacinth Meeker    Chief Complaint: left leg weakness  HPI:                                                                                                                                         [name] is an 74 y.o. male with known Parkinson's disease who has been on Stalevo q 4 hours but over the past 3 months he and wife has noted he has been having increased falls and progressive decline.  Today he was in the bathroom and went to stand up when he noted he could not move his left leg as well and then tripped on his underwear causing him to fall.  He was bale to slowly bring himself to the floor but remained there for a while as EMS could not get into the house due to it being locked.  Currently he is in the ED. Neurology was consulted due to his new onset of left leg weakness.   Date last known well: Unable to determine Time last known well: Unable to determine tPA Given: No: no LSN time      Past Medical History  Diagnosis Date  . UTI (urinary tract infection)   . Parkinson disease   . Venous stasis     edema  . Obesity   . Hypothyroidism   . Morbid obesity 07/14/2012    Past Surgical History  Procedure Laterality Date  . Cervical laminectomy    . Foot surgery  1992    right foot repair  . Appendectomy    . Anterior cervical discectomy c6-7      Family History  Problem Relation Age of Onset  . Heart disease Mother   . Hyperlipidemia Mother   . Colon cancer Father   . Alcohol abuse Other   . Cancer Sister     breast    Social History:  reports that he quit smoking about 22 years ago. His smoking use included Cigarettes. He quit after 2 years of use. He has never used smokeless tobacco. He reports that he does not drink alcohol or use illicit drugs.  Allergies: No Known Allergies  Medications:                                                                                                                           Current Facility-Administered  Medications  Medication Dose Route Frequency Provider Last Rate Last Dose  . carbidopa-levodopa-entacapone (STALEVO) 25-100-200 MG per tablet 1 tablet  1 tablet Oral 6 X Daily Linwood Dibbles, MD       Current Outpatient Prescriptions  Medication Sig Dispense Refill  . calcium carbonate (TUMS - DOSED IN MG ELEMENTAL CALCIUM) 500 MG chewable tablet Chew 1-2 tablets by mouth 3 (three) times daily.    . carbidopa-levodopa-entacapone (STALEVO) 25-100-200 MG per tablet TAKE 1 TABLET BY MOUTH 6 TIMES DAILY 540 tablet 3  . ibuprofen (ADVIL,MOTRIN) 200 MG tablet Take 400 mg by mouth every 6 (six) hours as needed for moderate pain.    . carbidopa-levodopa-entacapone (STALEVO) 25-100-200 MG per tablet TAKE 1 TABLET BY MOUTH 6 TIMES DAILY 180 tablet 5  . rasagiline (AZILECT) 1 MG TABS tablet Take 1 tablet (1 mg total) by mouth daily. 30 tablet 5     ROS:                                                                                                                                       History obtained from the patient  General ROS: negative for - chills, fatigue, fever, night sweats, weight gain or weight loss Psychological ROS: negative for - behavioral disorder, hallucinations, memory difficulties, mood swings or suicidal ideation Ophthalmic ROS: negative for - blurry vision, double vision, eye pain or loss of vision ENT ROS: negative for - epistaxis, nasal discharge, oral lesions, sore throat, tinnitus or vertigo Allergy and Immunology ROS: negative for - hives or itchy/watery eyes Hematological and Lymphatic ROS: negative for - bleeding problems, bruising or swollen lymph nodes Endocrine ROS: negative for - galactorrhea, hair pattern changes, polydipsia/polyuria or temperature intolerance Respiratory ROS: negative for - cough, hemoptysis, shortness of breath or wheezing Cardiovascular ROS: negative for - chest pain, dyspnea on exertion, edema or irregular heartbeat Gastrointestinal ROS: negative for -  abdominal pain, diarrhea, hematemesis, nausea/vomiting or stool incontinence Genito-Urinary ROS: negative for - dysuria, hematuria, incontinence or urinary frequency/urgency Musculoskeletal ROS: negative for - joint swelling or muscular weakness Neurological ROS: as noted in HPI Dermatological ROS: negative for rash and skin lesion changes  Physical Examination:                                                                                                      Blood pressure 135/67, pulse 57, temperature 98.5 F (36.9 C), temperature source Oral, resp. rate 23, SpO2 100 %.  HEENT-  Normocephalic, no lesions,  without obvious abnormality.  Normal external eye and conjunctiva.  Normal TM's bilaterally.  Normal auditory canals and external ears. Normal external nose, mucus membranes and septum.  Normal pharynx. Cardiovascular- S1, S2 normal, pulses palpable throughout   Lungs- chest clear, no wheezing, rales, normal symmetric air entry Abdomen- normal findings: bowel sounds normal Extremities- no edema Lymph-no adenopathy palpable Musculoskeletal-no joint tenderness, deformity or swelling Skin-warm and dry, no hyperpigmentation, vitiligo, or suspicious lesions  Neurological Examination Mental Status: Alert, oriented, thought content appropriate.  Speech fluent without evidence of aphasia.  Able to follow 3 step commands without difficulty. Cranial Nerves: II: Discs flat bilaterally; Visual fields grossly normal, pupils equal, round, reactive to light and accommodation III,IV, VI: ptosis not present, extra-ocular motions intact bilaterally V,VII: smile symmetric, facial light touch sensation normal bilaterally, masked facies VIII: hearing normal bilaterally IX,X: uvula rises symmetrically XI: bilateral shoulder shrug XII: midline tongue extension Motor: Right : Upper extremity   5/5    Left:     Upper extremity   5/5  Lower extremity   5/5     Lower extremity   --see note --hip flexion  1/5, knee flexion 4/5, knee extension 5/5, DF/PF 5/5  Tone and bulk:normal tone throughout; no atrophy noted Sensory: Pinprick and light touch intact throughout, bilaterally Deep Tendon Reflexes: 1+ and symmetric throughout Plantars: Right: upgoing   Left: downgoing Cerebellar: normal finger-to-nose, normal heel-to-shin test on the right Gait: not tested    Lab Results: Basic Metabolic Panel:  Recent Labs Lab 08/10/14 1602  NA 136  K 4.7  CL 105  CO2 24  GLUCOSE 97  BUN 23*  CREATININE 0.93  CALCIUM 8.6*    Liver Function Tests: No results for input(s): AST, ALT, ALKPHOS, BILITOT, PROT, ALBUMIN in the last 168 hours. No results for input(s): LIPASE, AMYLASE in the last 168 hours. No results for input(s): AMMONIA in the last 168 hours.  CBC:  Recent Labs Lab 08/10/14 1602  WBC 7.7  HGB 13.9  HCT 41.3  MCV 86.9  PLT 139*    Cardiac Enzymes: No results for input(s): CKTOTAL, CKMB, CKMBINDEX, TROPONINI in the last 168 hours.  Lipid Panel: No results for input(s): CHOL, TRIG, HDL, CHOLHDL, VLDL, LDLCALC in the last 168 hours.  CBG: No results for input(s): GLUCAP in the last 168 hours.  Microbiology: Results for orders placed or performed in visit on 08/16/13  Culture, Urine     Status: None   Collection Time: 08/16/13  3:38 PM  Result Value Ref Range Status   Colony Count >=100,000 COLONIES/ML  Final   Organism ID, Bacteria Multiple bacterial morphotypes present, none  Final   Organism ID, Bacteria predominant. Suggest appropriate recollection if   Final   Organism ID, Bacteria clinically indicated.  Final    Coagulation Studies: No results for input(s): LABPROT, INR in the last 72 hours.  Imaging: Ct Head Wo Contrast  08/10/2014   CLINICAL DATA:  74 year old male status post fall. History of Parkinson's and hypertension.  EXAM: CT HEAD WITHOUT CONTRAST  TECHNIQUE: Contiguous axial images were obtained from the base of the skull through the vertex  without intravenous contrast.  COMPARISON:  Head CT dated 09/02/2013  FINDINGS: There is slight prominence of the ventricles and sulci compatible with age-related volume loss. Mild periventricular and deep white matter hypodensities represent chronic microvascular ischemic changes. There is no intracranial hemorrhage. No mass effect or midline shift identified.  The visualized paranasal sinuses and mastoid air cells are well aerated.  The calvarium is intact.  IMPRESSION: No acute intracranial pathology.  Mild age-related atrophy and chronic microvascular ischemic disease.  If symptoms persist and there are no contraindications, MRI may provide better evaluation if clinically indicated   Electronically Signed   By: Elgie Collard M.D.   On: 08/10/2014 15:36   Dg Chest Port 1 View  08/10/2014   CLINICAL DATA:  Weakness after fall earlier today  EXAM: PORTABLE CHEST - 1 VIEW  COMPARISON:  February 03, 2010  FINDINGS: In comparison with the prior study, there is a new 7 x 5 mm nodular opacity in the right upper lobe near the apex. Lungs elsewhere clear. Heart size and pulmonary vascularity are normal. No adenopathy. No bone lesions. No pneumothorax.  IMPRESSION: 7 x 5 mm nodular opacity in the periphery of the right upper lobe near the apex. Advise followup chest radiograph in 3 months to assess for stability. Elsewhere lungs are clear.   Electronically Signed   By: Bretta Bang III M.D.   On: 08/10/2014 15:12       Assessment and plan discussed with with attending physician and they are in agreement.    Felicie Morn PA-C [facility] [phone]  08/10/2014, 4:54 PM   Assessment: 74 y.o. male with known PD and frequent falls. Today suffered fall secondary to left leg weakness.  On exam he has left proximal left leg weakness with preserved strength distally.  No dermatomal distribution decreased sensation and preserved reflexes at the knee. At this time etiology is unclear but cannot  rule out possible CVA versus hip pathology.  Will follow up.  Stroke Risk Factors - none   Recommend: 1) MRI brain without contrast 2) MRI left hip.    Patient seen and examined together with physician assistant and I concur with the assessment and plan.  Wyatt Portela, MD

## 2014-08-10 NOTE — Telephone Encounter (Signed)
Patients wife called and requested to speak with Dr. Frances Furbish regarding the patients condition. She states that he is not able to be at home alone any longer and would like to discuss options. Please call and advise.

## 2014-08-10 NOTE — H&P (Signed)
[facility] History and Physical  [name] ZOX:[id number] DOB: [birth year] DOA: 08/10/2014  Referring physician:  PCP: Kristian Covey, MD   Chief Complaint: Recurrent falls  HPI: [name] is a 74 y.o. male  with a past medical history of Parkinson's disease who is currently on Stalevo 25/100/200 one tablet 6 times daily, rasagiline 1 mg daily, who presents to the emergency department with complaints of left lower extremity weakness associated with multiple falls. His wife reporting that he has had recurrent falls over the past 3 months associated with a progressive functional decline. He had a fall in her bathroom today while attempting to stand up and was unable to move his left lower extremity. Workup in the emergency department included a CT scan of brain that did not reveal acute intracranial findings. He was evaluated by neurology.                                                                                                                                                 Review of Systems:  Constitutional:  No weight loss, night sweats, Fevers, chills, fatigue.  HEENT:  No headaches, Difficulty swallowing,Tooth/dental problems,Sore throat,  No sneezing, itching, ear ache, nasal congestion, post nasal drip,  Cardio-vascular:  No chest pain, Orthopnea, PND, swelling in lower extremities, anasarca, dizziness, palpitations  GI:  No heartburn, indigestion, abdominal pain, nausea, vomiting, diarrhea, change in bowel habits, loss of appetite  Resp:  No shortness of breath with exertion or at rest. No excess mucus, no productive cough, No non-productive cough, No coughing up of blood.No change in color of mucus.No wheezing.No chest wall deformity  Skin:  no rash or lesions.  GU:  no dysuria, change in color of urine, no urgency or frequency. No flank pain.  Musculoskeletal:  No joint pain or swelling. No decreased range of motion. No back pain.  Psych:  No change in  mood or affect. No depression or anxiety. No memory loss.   Past Medical History  Diagnosis Date  . UTI (urinary tract infection)   . Parkinson disease   . Venous stasis     edema  . Obesity   . Hypothyroidism   . Morbid obesity 07/14/2012   Past Surgical History  Procedure Laterality Date  . Cervical laminectomy    . Foot surgery  1992    right foot repair  . Appendectomy    . Anterior cervical discectomy c6-7     Social History:  reports that he quit smoking about 22 years ago. His smoking use included Cigarettes. He quit after 2 years of use. He has never used smokeless tobacco. He reports that he does not drink alcohol or use illicit drugs.  No Known Allergies  Family History  Problem Relation Age of Onset  . Heart disease Mother   . Hyperlipidemia Mother   . Colon  cancer Father   . Alcohol abuse Other   . Cancer Sister     breast      Prior to Admission medications   Medication Sig Start Date End Date Taking? Authorizing Provider  calcium carbonate (TUMS - DOSED IN MG ELEMENTAL CALCIUM) 500 MG chewable tablet Chew 1-2 tablets by mouth 3 (three) times daily.   Yes Historical Provider, MD  carbidopa-levodopa-entacapone (STALEVO) 25-100-200 MG per tablet TAKE 1 TABLET BY MOUTH 6 TIMES DAILY 10/26/13  Yes Huston Foley, MD  ibuprofen (ADVIL,MOTRIN) 200 MG tablet Take 400 mg by mouth every 6 (six) hours as needed for moderate pain.   Yes Historical Provider, MD  carbidopa-levodopa-entacapone (STALEVO) 25-100-200 MG per tablet TAKE 1 TABLET BY MOUTH 6 TIMES DAILY 05/22/14   Huston Foley, MD  rasagiline (AZILECT) 1 MG TABS tablet Take 1 tablet (1 mg total) by mouth daily. 05/22/14   Huston Foley, MD   Physical Exam: Filed Vitals:   08/10/14 1615 08/10/14 1630 08/10/14 1645 08/10/14 1703  BP: 144/87 151/86 139/84 130/79  Pulse: 62 60 62 63  Temp:      TempSrc:      Resp: 23 17 21 18   SpO2: 98% 100% 99% 98%    Wt Readings from Last 3 Encounters:  05/22/14 122.471 kg (270 lb)    10/26/13 123.832 kg (273 lb)  09/02/13 122.471 kg (270 lb)    General:  Appears calm and comfortable. NAD Eyes: PERRL, normal lids, irises & conjunctiva ENT: grossly normal hearing, lips & tongue Neck: no LAD, masses or thyromegaly Cardiovascular: RRR, no m/r/g. No LE edema. Telemetry: SR, no arrhythmias  Respiratory: CTA bilaterally, no w/r/r. Normal respiratory effort. Abdomen: soft, ntnd Skin: no rash or induration seen on limited exam Musculoskeletal: grossly normal tone BUE/BLE Psychiatric: grossly normal mood and affect, speech fluent and appropriate Neurologic: Patient having 2/5 muscle strength to left lower extremity, sensation is in tact. He had 5/5 muscle strength to bilateral upper extremities and right lower extremity. There is some rigidity associated. No slurred speech or facial droop.           Labs on Admission:  Basic Metabolic Panel:  Recent Labs Lab 08/10/14 1602  NA 136  K 4.7  CL 105  CO2 24  GLUCOSE 97  BUN 23*  CREATININE 0.93  CALCIUM 8.6*   Liver Function Tests: No results for input(s): AST, ALT, ALKPHOS, BILITOT, PROT, ALBUMIN in the last 168 hours. No results for input(s): LIPASE, AMYLASE in the last 168 hours. No results for input(s): AMMONIA in the last 168 hours. CBC:  Recent Labs Lab 08/10/14 1602  WBC 7.7  HGB 13.9  HCT 41.3  MCV 86.9  PLT 139*   Cardiac Enzymes: No results for input(s): CKTOTAL, CKMB, CKMBINDEX, TROPONINI in the last 168 hours.  BNP (last 3 results) No results for input(s): BNP in the last 8760 hours.  ProBNP (last 3 results) No results for input(s): PROBNP in the last 8760 hours.  CBG: No results for input(s): GLUCAP in the last 168 hours.  Radiological Exams on Admission: Ct Head Wo Contrast  08/10/2014   CLINICAL DATA:  74 year old male status post fall. History of Parkinson's and hypertension.  EXAM: CT HEAD WITHOUT CONTRAST  TECHNIQUE: Contiguous axial images were obtained from the base of the  skull through the vertex without intravenous contrast.  COMPARISON:  Head CT dated 09/02/2013  FINDINGS: There is slight prominence of the ventricles and sulci compatible with age-related volume loss. Mild periventricular and  deep white matter hypodensities represent chronic microvascular ischemic changes. There is no intracranial hemorrhage. No mass effect or midline shift identified.  The visualized paranasal sinuses and mastoid air cells are well aerated. The calvarium is intact.  IMPRESSION: No acute intracranial pathology.  Mild age-related atrophy and chronic microvascular ischemic disease.  If symptoms persist and there are no contraindications, MRI may provide better evaluation if clinically indicated   Electronically Signed   By: Elgie Collard M.D.   On: 08/10/2014 15:36   Dg Chest Port 1 View  08/10/2014   CLINICAL DATA:  Weakness after fall earlier today  EXAM: PORTABLE CHEST - 1 VIEW  COMPARISON:  February 03, 2010  FINDINGS: In comparison with the prior study, there is a new 7 x 5 mm nodular opacity in the right upper lobe near the apex. Lungs elsewhere clear. Heart size and pulmonary vascularity are normal. No adenopathy. No bone lesions. No pneumothorax.  IMPRESSION: 7 x 5 mm nodular opacity in the periphery of the right upper lobe near the apex. Advise followup chest radiograph in 3 months to assess for stability. Elsewhere lungs are clear.   Electronically Signed   By: Bretta Bang III M.D.   On: 08/10/2014 15:12    EKG: Independently reviewed.   Assessment/Plan Principal Problem:   Recurrent falls Active Problems:   PARKINSON'S DISEASE   Morbid obesity   Falls   1. Recurrent falls. Patient with history of Parkinson's disease presenting with a 3 month history of progressive decline and recurrent falls. He he reports having increased weakness involving his left lower extremity and thinks this may be contributing to his falls. Initial workup included a CT scan of brain that  did not reveal acute intracranial pathology. He was seen and evaluated by neurology who recommended obtaining MRI of brain. Possibilities for left lower extremity weakness that is associated with recurrent falls include progression of Parkinson's disease, left hip pathology, CVA, orthostatic hypotension as well as underlying infectious process. Will obtain x-ray of left hip, urinalysis orthostatic vital signs. Consult physical therapy.  2. Parkinson's disease. Patient was seen and evaluated by neurology in the emergency department. Will continue Stalevo 25/100/200 one tablet 6 times daily along with rasagiline. I think its possible progression of Parkinson's may be contributing to his falls.  3. Lung Nodule. Chest ray showed a 7 x 5 mm nodular opacity in periphery of the right upper lobe. Radiology recommending repeat CXR in 3 months.    Code Status: Full code DVT Prophylaxis: Subcutaneous Lovenox Family Communication: Spoke with his wife was present at bedside Disposition Plan: Will place patient overnight observation, do not anticipate him requiring greater than 2 night hospitalization  Time spent: 55 minutes  Jeralyn Bennett [facility] Pager [phone]

## 2014-08-11 DIAGNOSIS — R531 Weakness: Secondary | ICD-10-CM | POA: Diagnosis not present

## 2014-08-11 DIAGNOSIS — E039 Hypothyroidism, unspecified: Secondary | ICD-10-CM | POA: Diagnosis not present

## 2014-08-11 DIAGNOSIS — G20 Parkinson's disease: Secondary | ICD-10-CM | POA: Diagnosis not present

## 2014-08-11 DIAGNOSIS — R296 Repeated falls: Secondary | ICD-10-CM | POA: Diagnosis not present

## 2014-08-11 LAB — BASIC METABOLIC PANEL
Anion gap: 7 (ref 5–15)
BUN: 17 mg/dL (ref 6–20)
CHLORIDE: 105 mmol/L (ref 101–111)
CO2: 26 mmol/L (ref 22–32)
CREATININE: 0.87 mg/dL (ref 0.61–1.24)
Calcium: 8.2 mg/dL — ABNORMAL LOW (ref 8.9–10.3)
GFR calc Af Amer: >60 mL/min (ref >60)
GFR calc non Af Amer: >60 mL/min (ref >60)
Glucose, Bld: 86 mg/dL (ref 65–99)
Potassium: 4 mmol/L (ref 3.5–5.1)
Sodium: 138 mmol/L (ref 135–145)

## 2014-08-11 LAB — TSH: TSH: 4.95 u[IU]/mL — AB (ref 0.350–4.500)

## 2014-08-11 LAB — CBC
HCT: 39.9 % (ref 39.0–52.0)
Hemoglobin: 13.4 g/dL (ref 13.0–17.0)
MCH: 29.1 pg (ref 26.0–34.0)
MCHC: 33.6 g/dL (ref 30.0–36.0)
MCV: 86.6 fL (ref 78.0–100.0)
PLATELETS: 126 10*3/uL — AB (ref 150–400)
RBC: 4.61 MIL/uL (ref 4.22–5.81)
RDW: 12.8 % (ref 11.5–15.5)
WBC: 5.1 10*3/uL (ref 4.0–10.5)

## 2014-08-11 NOTE — Progress Notes (Signed)
Pt arrived on floor, A&O, vital signs stable, and no apparent signs of immediate distress. Upon assessment, patient was too week to stand up for orthostatic vital signs. Patient as well as wife requested to try again tomorrow. Will continue to monitor.

## 2014-08-11 NOTE — Progress Notes (Signed)
Sherril Cong to be D/C'd Home per MD order.  Discussed with the patient and all questions fully answered.  VSS, Skin clean, dry and intact without evidence of skin break down, no evidence of skin tears noted. IV catheter discontinued intact. Site without signs and symptoms of complications. Dressing and pressure applied.  An After Visit Summary was printed and given to the patient. Patient received prescription.  D/c education completed with patient/family including follow up instructions, medication list, d/c activities limitations if indicated, with other d/c instructions as indicated by MD - patient able to verbalize understanding, all questions fully answered.   Patient instructed to return to ED, call 911, or call MD for any changes in condition.   Patient escorted via WC, and D/C home via private auto.  Beckey Downing F 08/11/2014 12:52 PM

## 2014-08-11 NOTE — Evaluation (Signed)
Physical Therapy Evaluation Patient Details Name: [name] MRN: [id number] DOB: August 18, 1940 Today's Date: 08/11/2014   History of Present Illness  Adm s/p fall in bathroom (per wife stood up with underwear under his foot and slipped). Laid on floor 1.5 hours as he could not get up. Adm via EMS with Left leg weakness PMHx-Parkinson's (with 3 month functional decline and incr falls), ACDF C6-7  Clinical Impression  Patient evaluated by Physical Therapy with no further acute PT needs identified. All education has been completed and the patient has no further questions. Wife present and discussed home set-up and no DME needs identified. Plan is for discharge home today with wife to increase supervision as much as possible. Plans being made for HHPT and aide. PT is signing off. Thank you for this referral.     Follow Up Recommendations Home health PT;Supervision/Assistance - 24 hour (pt/wife aware of recommendation and making plans)    Equipment Recommendations  None recommended by PT    Recommendations for Other Services       Precautions / Restrictions Precautions Precautions: Fall      Mobility  Bed Mobility                  Transfers Overall transfer level: Needs assistance Equipment used: Rolling walker (2 wheeled) Transfers: Sit to/from Stand Sit to Stand: Min assist         General transfer comment: wife provided assist and cues for pt to fully extend knees and hips to achieve upright  Ambulation/Gait Ambulation/Gait assistance: Min guard Ambulation Distance (Feet): 120 Feet Assistive device: Rolling walker (2 wheeled) Gait Pattern/deviations: Step-through pattern;Decreased stride length;Shuffle;Trunk flexed     General Gait Details: forward flexed (partly due to RW too short), good stride length, small shuffling steps with turns and stepping backwards, no loss of balance  Stairs            Wheelchair Mobility    Modified Rankin (Stroke Patients  Only)       Balance Overall balance assessment: Needs assistance;History of Falls         Standing balance support: Bilateral upper extremity supported Standing balance-Leahy Scale: Poor Standing balance comment: pt unable to release RW with even one hand due to fear                              Pertinent Vitals/Pain See vitals flow sheet.   Pain Assessment: No/denies pain    Home Living Family/patient expects to be discharged to:: Private residence Living Arrangements: Spouse/significant other Available Help at Discharge: Family;Available PRN/intermittently Type of Home: House Home Access: Stairs to enter     Home Layout: One level Home Equipment: Environmental consultant - 4 wheels;Walker - standard;Shower seat;Grab bars - toilet;Grab bars - tub/shower;Hand held shower head      Prior Function Level of Independence: Independent with assistive device(s)         Comments: although frequently walks without device and admits each of his falls has been when he does not have walker     Hand Dominance        Extremity/Trunk Assessment               Lower Extremity Assessment: Generalized weakness      Cervical / Trunk Assessment: Kyphotic  Communication   Communication: Other (comment) (slow, low volume )  Cognition Arousal/Alertness: Awake/alert Behavior During Therapy: WFL for tasks assessed/performed Overall Cognitive Status: Within Functional Limits  for tasks assessed       Memory:  (? impaired vs stubborn about no walker)              General Comments General comments (skin integrity, edema, etc.): Wife is a NP and present throughout evaluation. Lengthy discussion re: safety issues at home. Pt resists using DME and at this time agreed to use it. She has a home alarm system with cameras and pt has an emergency button on a remote he can carry to signal company to call 911. Recommended making a velcro wrist strap or necklace that pt can carry remote  with him at all times (he did not have it yesterday). She has a list of people that can also respond if he needs help. Pt goes to a gym to work with a Neurosurgeon in neuro degenerative disorders twice per week.    Exercises        Assessment/Plan    PT Assessment All further PT needs can be met in the next venue of care  PT Diagnosis Difficulty walking   PT Problem List Decreased strength;Decreased balance;Decreased mobility;Decreased knowledge of use of DME;Decreased safety awareness;Obesity  PT Treatment Interventions     PT Goals (Current goals can be found in the Care Plan section) Acute Rehab PT Goals Patient Stated Goal: improve walking; stop falling PT Goal Formulation: All assessment and education complete, DC therapy    Frequency     Barriers to discharge Decreased caregiver support wife works and plans to have others checking in on pt more frequently    Co-evaluation               End of Session   Activity Tolerance: Patient tolerated treatment well Patient left: in chair;with call bell/phone within reach;with family/visitor present Nurse Communication:  (notified MD via text page)    Functional Assessment Tool Used: clinical judgement Functional Limitation: Mobility: Walking and moving around Mobility: Walking and Moving Around Current Status (U0454): At least 20 percent but less than 40 percent impaired, limited or restricted Mobility: Walking and Moving Around Goal Status [phone]): At least 1 percent but less than 20 percent impaired, limited or restricted Mobility: Walking and Moving Around Discharge Status [phone]): At least 20 percent but less than 40 percent impaired, limited or restricted    Time: 1111-1142 PT Time Calculation (min) (ACUTE ONLY): 31 min   Charges:   PT Evaluation $Initial PT Evaluation Tier I: 1 Procedure PT Treatments $Gait Training: 8-22 mins   PT G Codes:   PT G-Codes **NOT FOR INPATIENT CLASS** Functional Assessment  Tool Used: clinical judgement Functional Limitation: Mobility: Walking and moving around Mobility: Walking and Moving Around Current Status (G9562): At least 20 percent but less than 40 percent impaired, limited or restricted Mobility: Walking and Moving Around Goal Status [phone]): At least 1 percent but less than 20 percent impaired, limited or restricted Mobility: Walking and Moving Around Discharge Status [phone]): At least 20 percent but less than 40 percent impaired, limited or restricted    Mahin Guardia 08/11/2014, 12:01 PM Pager [phone]

## 2014-08-11 NOTE — Progress Notes (Signed)
Subjective: Left leg has improved significantly and he s now able to move against gravity,   Objective: Current vital signs: BP 109/57 mmHg  Pulse 56  Temp(Src) 97.8 F (36.6 C) (Oral)  Resp 18  Ht  (1.88 m)  Wt 125.5 kg (276 lb 10.8 oz)  BMI 35.51 kg/m2  SpO2 98% Vital signs in last 24 hours: Temp:  [97.5 F (36.4 C)-98.5 F (36.9 C)] 97.8 F (36.6 C) (06/24 0600) Pulse Rate:  [56-69] 56 (06/24 0600) Resp:  [14-26] 18 (06/24 0600) BP: (109-167)/(57-106) 109/57 mmHg (06/24 0600) SpO2:  [97 %-100 %] 98 % (06/24 0600) Weight:  [125.5 kg (276 lb 10.8 oz)] 125.5 kg (276 lb 10.8 oz) (06/23 2000)  Intake/Output from previous day: 06/23 0701 - 06/24 0700 In: 240 [P.O.:240] Out: 600 [Urine:600] Intake/Output this shift: Total I/O In: 240 [P.O.:240] Out: 525 [Urine:525] Nutritional status: Diet Heart Room service appropriate?: Yes; Fluid consistency:: Thin  Neurologic Exam: Mental Status: Alert, oriented, thought content appropriate. Speech fluent without evidence of aphasia. Able to follow 3 step commands without difficulty. Cranial Nerves: II:  Visual fields grossly normal, pupils equal, round, reactive to light and accommodation III,IV, VI: ptosis not present, extra-ocular motions intact bilaterally V,VII: smile symmetric, facial light touch sensation normal bilaterally, masked facies VIII: hearing normal bilaterally IX,X: uvula rises symmetrically XI: bilateral shoulder shrug XII: midline tongue extension Motor: Right :Upper extremity 5/5Left: Upper extremity 5/5 Lower extremity 5/5Lower extremity --see note --hip flexion 3/5, knee flexion 5/5, knee extension 5/5, DF/PF 5/5  Tone and bulk:normal tone throughout; no atrophy noted Sensory: Pinprick and light touch intact throughout, bilaterally Deep Tendon Reflexes: 1+ and symmetric  throughout Plantars: Right: upgoingLeft: downgoing Cerebellar: normal finger-to-nose, normal heel-to-shin test on the right Gait: not tested  Lab Results: Basic Metabolic Panel:  Recent Labs Lab 08/10/14 1602 08/11/14 0549  NA 136 138  K 4.7 4.0  CL 105 105  CO2 24 26  GLUCOSE 97 86  BUN 23* 17  CREATININE 0.93 0.87  CALCIUM 8.6* 8.2*    Liver Function Tests: No results for input(s): AST, ALT, ALKPHOS, BILITOT, PROT, ALBUMIN in the last 168 hours. No results for input(s): LIPASE, AMYLASE in the last 168 hours. No results for input(s): AMMONIA in the last 168 hours.  CBC:  Recent Labs Lab 08/10/14 1602 08/11/14 0549  WBC 7.7 5.1  HGB 13.9 13.4  HCT 41.3 39.9  MCV 86.9 86.6  PLT 139* 126*    Cardiac Enzymes: No results for input(s): CKTOTAL, CKMB, CKMBINDEX, TROPONINI in the last 168 hours.  Lipid Panel: No results for input(s): CHOL, TRIG, HDL, CHOLHDL, VLDL, LDLCALC in the last 168 hours.  CBG: No results for input(s): GLUCAP in the last 168 hours.  Microbiology: Results for orders placed or performed in visit on 08/16/13  Culture, Urine     Status: None   Collection Time: 08/16/13  3:38 PM  Result Value Ref Range Status   Colony Count >=100,000 COLONIES/ML  Final   Organism ID, Bacteria Multiple bacterial morphotypes present, none  Final   Organism ID, Bacteria predominant. Suggest appropriate recollection if   Final   Organism ID, Bacteria clinically indicated.  Final    Coagulation Studies: No results for input(s): LABPROT, INR in the last 72 hours.  Imaging: Ct Head Wo Contrast  08/10/2014   CLINICAL DATA:  74 year old male status post fall. History of Parkinson's and hypertension.  EXAM: CT HEAD WITHOUT CONTRAST  TECHNIQUE: Contiguous axial images were obtained from the  base of the skull through the vertex without intravenous contrast.  COMPARISON:  Head CT dated 09/02/2013  FINDINGS: There is slight prominence of the  ventricles and sulci compatible with age-related volume loss. Mild periventricular and deep white matter hypodensities represent chronic microvascular ischemic changes. There is no intracranial hemorrhage. No mass effect or midline shift identified.  The visualized paranasal sinuses and mastoid air cells are well aerated. The calvarium is intact.  IMPRESSION: No acute intracranial pathology.  Mild age-related atrophy and chronic microvascular ischemic disease.  If symptoms persist and there are no contraindications, MRI may provide better evaluation if clinically indicated   Electronically Signed   By: Elgie Collard M.D.   On: 08/10/2014 15:36   Mr Brain Wo Contrast  08/10/2014   CLINICAL DATA:  74 year old male with progressive decline, current history of Parkinson's disease. Acute onset inability to move left lower extremity and subsequent fall. Initial encounter.  EXAM: MRI HEAD WITHOUT CONTRAST  TECHNIQUE: Multiplanar, multiecho pulse sequences of the brain and surrounding structures were obtained without intravenous contrast.  COMPARISON:  Head CT without contrast 1531 hr today and earlier. Brain MRI 01/31/2010. Cervical spine MRI 02/05/2010.  FINDINGS: No restricted diffusion to suggest acute infarction. No midline shift, mass effect, evidence of mass lesion, ventriculomegaly, extra-axial collection or acute intracranial hemorrhage. Cervicomedullary junction and pituitary are within normal limits. Major intracranial vascular flow voids are within normal limits.  Left side scalp susceptibility artifact of unknown etiology (series 8, image 86), might be related to scalp skin staples. Mild for age nonspecific cerebral white matter T2 and FLAIR hyperintensity. No chronic cerebral blood products identified. No cortical encephalomalacia.  Visible internal auditory structures appear normal. Postoperative changes to both globes. Visualized paranasal sinuses and mastoids are clear. Normal bone marrow signal.   Chronic cervical spinal stenosis is re- identified at C3-C4 and appears grossly stable since 2011.  IMPRESSION: 1. No acute intracranial abnormality. Mild for age nonspecific cerebral white matter signal changes. 2. Chronic cervical spinal stenosis, that seen at C3-C4 appears grossly stable since 2011.   Electronically Signed   By: Odessa Fleming M.D.   On: 08/10/2014 18:49   Mr Hip Left Wo Contrast  08/11/2014   CLINICAL DATA:  History of recurrent falls. Recurrent falls. Most recent 08/10/2014. Left hip pain. Weakness.  EXAM: MR OF THE LEFT HIP WITHOUT CONTRAST  TECHNIQUE: Multiplanar, multisequence MR imaging was performed. No intravenous contrast was administered.  COMPARISON:  Plain films left hip earlier this same day.  FINDINGS: Bones: No fracture. Both hips are located. No avascular necrosis of the femoral heads is present.  Articular cartilage and labrum  Articular cartilage:  Unremarkable.  Labrum:  Intact.  Joint or bursal effusion  Joint effusion:  None.  Bursae: Small amount of fluid is seen in the left trochanteric bursa.  Muscles and tendons  Muscles and tendons:  Intact.  Other findings  Miscellaneous:  Fat containing left inguinal hernia is noted.  IMPRESSION: No acute abnormality.  Negative for fracture.  Small amount of fluid in the left trochanteric bursa compatible with bursitis.   Electronically Signed   By: Drusilla Kanner M.D.   On: 08/11/2014 07:10   Dg Chest Port 1 View  08/10/2014   CLINICAL DATA:  Weakness after fall earlier today  EXAM: PORTABLE CHEST - 1 VIEW  COMPARISON:  February 03, 2010  FINDINGS: In comparison with the prior study, there is a new 7 x 5 mm nodular opacity in the right upper lobe near  the apex. Lungs elsewhere clear. Heart size and pulmonary vascularity are normal. No adenopathy. No bone lesions. No pneumothorax.  IMPRESSION: 7 x 5 mm nodular opacity in the periphery of the right upper lobe near the apex. Advise followup chest radiograph in 3 months to assess for  stability. Elsewhere lungs are clear.   Electronically Signed   By: Bretta Bang III M.D.   On: 08/10/2014 15:12   Dg Hip Unilat With Pelvis 1v Left  08/10/2014   CLINICAL DATA:  Status post fall today. Left hip pain. Initial encounter.  EXAM: LEFT HIP (WITH PELVIS) 1 VIEW  COMPARISON:  None.  FINDINGS: The hips are located. No fracture is identified. There is no notable degenerative change about the hips. Soft tissue structures are unremarkable.  IMPRESSION: Negative exam.   Electronically Signed   By: Drusilla Kanner M.D.   On: 08/10/2014 17:57    Medications:  Scheduled: . carbidopa-levodopa  1 tablet Oral 6 X Daily   And  . entacapone  200 mg Oral 6 X Daily  . enoxaparin (LOVENOX) injection  40 mg Subcutaneous Q24H  . rasagiline  1 mg Oral Daily  . sodium chloride  3 mL Intravenous Q12H    Assessment/Plan: 74 y.o. male with known PD and frequent falls. He suffered fall secondary to left leg weakness. yesterday on exam he had left proximal left leg weakness with preserved strength distally. MRI brain and hip normal. Today his leg strength has improved significantly. 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   Recommend: 1) PT for gait.  2) follow up with out patient neurologist.   S/O  Felicie Morn PA-C [facility] [phone]  08/11/2014, 9:16 AM Patient seen and examined together with physician assistant and I concur with the assessment and plan.  Wyatt Portela, MD

## 2014-08-11 NOTE — Care Management Note (Signed)
Case Management Note  Patient Details  Name: [name] MRN: [id number] Date of Birth: 1940/05/06  Subjective/Objective:   NCM spoke with wife, she chose [facility] for [facility], PT and aide.  Referral made to Elmer Ramp notified.  Soc will begin 24-48 hrs post dc.                 Action/Plan:   Expected Discharge Date:                  Expected Discharge Plan:  Home w Home Health Services  In-House Referral:     Discharge planning Services  CM Consult  Post Acute Care Choice:  Home Health Choice offered to:  Spouse  DME Arranged:    DME Agency:     HH Arranged:  RN, PT, Nurse's Aide HH Agency:  [facility]  Status of Service:  Completed, signed off  Medicare Important Message Given:  No Date Medicare IM Given:    Medicare IM give by:    Date Additional Medicare IM Given:    Additional Medicare Important Message give by:     If discussed at Long Length of Stay Meetings, dates discussed:    Additional Comments:  Leone Haven, RN 08/11/2014, 12:10 PM

## 2014-08-16 NOTE — Discharge Summary (Addendum)
Physician Discharge Summary  [name]:[id number] DOB: 07/28/1940 DOA: 08/10/2014  PCP: Kristian Covey, MD  Admit date: 08/10/2014 Discharge date: 08/11/2014  Time spent: 35 minutes  Recommendations for Outpatient Follow-up:  1. Please follow up on Parkinsons Disease, hospitalized for recurrent falls, functional decline  2. Prior to discharge he was set up with Home Health Services  Discharge Diagnoses:  Principal Problem:   Recurrent falls Active Problems:   PARKINSON'S DISEASE   Morbid obesity   Falls   Discharge Condition: Stable  Diet recommendation: Heart Healthy  Filed Weights   08/10/14 2000  Weight: 125.5 kg (276 lb 10.8 oz)    History of present illness:  [name] is a 74 y.o. male with a past medical history of Parkinson's disease who is currently on Stalevo 25/100/200 one tablet 6 times daily, rasagiline 1 mg daily, who presents to the emergency department with complaints of left lower extremity weakness associated with multiple falls. His wife reporting that he has had recurrent falls over the past 3 months associated with a progressive functional decline. He had a fall in her bathroom today while attempting to stand up and was unable to move his left lower extremity. Workup in the emergency department included a CT scan of brain that did not reveal acute intracranial findings. He was evaluated by neurology.  Hospital Course:  Patient is a pleasant 74 year old gentleman with a past medical history of Parkinson's disease, admitted to the medicine service on 08/10/2014 presented with functional decline, recurrent falls, and a fall in the bathroom on the day of admission unable to standup. Initial workup included a CT scan of brain did not show acute intracranial findings. He was further worked up with an MRI of brain which not reveal acute intracranial abnormalities. Since he appear to have left lower extremity weakness he was further worked up with plain  imaging an MRI which did not show acute pathology. By the following day patient showed clinical improvement, he ambulated around his room with a walker. Suspect recurrent falls may be due to progression of underlying Parkinson's disease. There were no changes made to his regimen as he was instructed to follow-up with his primary neurologist in the office. He was set up with home health services for home PT, RN and OT prior to discharge.   Consultations:  Neurology  Physical therapy  Discharge Exam: Filed Vitals:   08/11/14 1120  BP: 113/72  Pulse:   Temp:   Resp:     General: Patient is awake and alert, flat affect, however responding to questions appropriately. Cardiovascular: Regular rate and rhythm normal S1-S2 no murmurs or gallops Respiratory: Normal respiratory effort Abdomen: Soft nontender nondistended Extremities. Trace edema to lower extremity bilaterally Neurological: Does not have significant rigidity on exam, preserved strength 12 extremities, no focal deficits.  Discharge Instructions   Discharge Instructions    Call MD for:  difficulty breathing, headache or visual disturbances    Complete by:  As directed      Call MD for:  extreme fatigue    Complete by:  As directed      Call MD for:  hives    Complete by:  As directed      Call MD for:  persistant dizziness or light-headedness    Complete by:  As directed      Call MD for:  persistant nausea and vomiting    Complete by:  As directed      Call MD for:  redness,  tenderness, or signs of infection (pain, swelling, redness, odor or green/yellow discharge around incision site)    Complete by:  As directed      Call MD for:  severe uncontrolled pain    Complete by:  As directed      Call MD for:  temperature >100.4    Complete by:  As directed      Call MD for:    Complete by:  As directed      Diet - low sodium heart healthy    Complete by:  As directed      Discharge instructions    Complete by:  As  directed   Please follow up with your Neurologist in 1 week     Increase activity slowly    Complete by:  As directed           Discharge Medication List as of 08/11/2014 12:13 PM    CONTINUE these medications which have NOT CHANGED   Details  calcium carbonate (TUMS - DOSED IN MG ELEMENTAL CALCIUM) 500 MG chewable tablet Chew 1-2 tablets by mouth 3 (three) times daily., Until Discontinued, Historical Med    carbidopa-levodopa-entacapone (STALEVO) 25-100-200 MG per tablet TAKE 1 TABLET BY MOUTH 6 TIMES DAILY, Normal    ibuprofen (ADVIL,MOTRIN) 200 MG tablet Take 400 mg by mouth every 6 (six) hours as needed for moderate pain., Until Discontinued, Historical Med    rasagiline (AZILECT) 1 MG TABS tablet Take 1 tablet (1 mg total) by mouth daily., Starting 05/22/2014, Until Discontinued, Normal       No Known Allergies Follow-up Information    Follow up with Kristian CoveyBURCHETTE,BRUCE W, MD In 2 weeks.   Specialty:  Family Medicine   Contact information:   [address] [place] [address] [phone]       Follow up with [facility].   Specialty:  Home Health Services   Why:  [facility], HHPT, aide   Contact information:   [address] [place] [address] [phone]        The results of significant diagnostics from this hospitalization (including imaging, microbiology, ancillary and laboratory) are listed below for reference.    Significant Diagnostic Studies: Ct Head Wo Contrast  08/10/2014   CLINICAL DATA:  74 year old male status post fall. History of Parkinson's and hypertension.  EXAM: CT HEAD WITHOUT CONTRAST  TECHNIQUE: Contiguous axial images were obtained from the base of the skull through the vertex without intravenous contrast.  COMPARISON:  Head CT dated 09/02/2013  FINDINGS: There is slight prominence of the ventricles and sulci compatible with age-related volume loss. Mild periventricular and deep white matter hypodensities represent chronic  microvascular ischemic changes. There is no intracranial hemorrhage. No mass effect or midline shift identified.  The visualized paranasal sinuses and mastoid air cells are well aerated. The calvarium is intact.  IMPRESSION: No acute intracranial pathology.  Mild age-related atrophy and chronic microvascular ischemic disease.  If symptoms persist and there are no contraindications, MRI may provide better evaluation if clinically indicated   Electronically Signed   By: Elgie CollardArash  Radparvar M.D.   On: 08/10/2014 15:36   Mr Brain Wo Contrast  08/10/2014   CLINICAL DATA:  74 year old male with progressive decline, current history of Parkinson's disease. Acute onset inability to move left lower extremity and subsequent fall. Initial encounter.  EXAM: MRI HEAD WITHOUT CONTRAST  TECHNIQUE: Multiplanar, multiecho pulse sequences of the brain and surrounding structures were obtained without intravenous contrast.  COMPARISON:  Head CT  without contrast 1531 hr today and earlier. Brain MRI 01/31/2010. Cervical spine MRI 02/05/2010.  FINDINGS: No restricted diffusion to suggest acute infarction. No midline shift, mass effect, evidence of mass lesion, ventriculomegaly, extra-axial collection or acute intracranial hemorrhage. Cervicomedullary junction and pituitary are within normal limits. Major intracranial vascular flow voids are within normal limits.  Left side scalp susceptibility artifact of unknown etiology (series 8, image 86), might be related to scalp skin staples. Mild for age nonspecific cerebral white matter T2 and FLAIR hyperintensity. No chronic cerebral blood products identified. No cortical encephalomalacia.  Visible internal auditory structures appear normal. Postoperative changes to both globes. Visualized paranasal sinuses and mastoids are clear. Normal bone marrow signal.  Chronic cervical spinal stenosis is re- identified at C3-C4 and appears grossly stable since 2011.  IMPRESSION: 1. No acute intracranial  abnormality. Mild for age nonspecific cerebral white matter signal changes. 2. Chronic cervical spinal stenosis, that seen at C3-C4 appears grossly stable since 2011.   Electronically Signed   By: Odessa Fleming M.D.   On: 08/10/2014 18:49   Mr Hip Left Wo Contrast  08/11/2014   CLINICAL DATA:  History of recurrent falls. Recurrent falls. Most recent 08/10/2014. Left hip pain. Weakness.  EXAM: MR OF THE LEFT HIP WITHOUT CONTRAST  TECHNIQUE: Multiplanar, multisequence MR imaging was performed. No intravenous contrast was administered.  COMPARISON:  Plain films left hip earlier this same day.  FINDINGS: Bones: No fracture. Both hips are located. No avascular necrosis of the femoral heads is present.  Articular cartilage and labrum  Articular cartilage:  Unremarkable.  Labrum:  Intact.  Joint or bursal effusion  Joint effusion:  None.  Bursae: Small amount of fluid is seen in the left trochanteric bursa.  Muscles and tendons  Muscles and tendons:  Intact.  Other findings  Miscellaneous:  Fat containing left inguinal hernia is noted.  IMPRESSION: No acute abnormality.  Negative for fracture.  Small amount of fluid in the left trochanteric bursa compatible with bursitis.   Electronically Signed   By: Drusilla Kanner M.D.   On: 08/11/2014 07:10   Dg Chest Port 1 View  08/10/2014   CLINICAL DATA:  Weakness after fall earlier today  EXAM: PORTABLE CHEST - 1 VIEW  COMPARISON:  February 03, 2010  FINDINGS: In comparison with the prior study, there is a new 7 x 5 mm nodular opacity in the right upper lobe near the apex. Lungs elsewhere clear. Heart size and pulmonary vascularity are normal. No adenopathy. No bone lesions. No pneumothorax.  IMPRESSION: 7 x 5 mm nodular opacity in the periphery of the right upper lobe near the apex. Advise followup chest radiograph in 3 months to assess for stability. Elsewhere lungs are clear.   Electronically Signed   By: Bretta Bang III M.D.   On: 08/10/2014 15:12   Dg Hip Unilat With  Pelvis 1v Left  08/10/2014   CLINICAL DATA:  Status post fall today. Left hip pain. Initial encounter.  EXAM: LEFT HIP (WITH PELVIS) 1 VIEW  COMPARISON:  None.  FINDINGS: The hips are located. No fracture is identified. There is no notable degenerative change about the hips. Soft tissue structures are unremarkable.  IMPRESSION: Negative exam.   Electronically Signed   By: Drusilla Kanner M.D.   On: 08/10/2014 17:57    Microbiology: No results found for this or any previous visit (from the past 240 hour(s)).   Labs: Basic Metabolic Panel:  Recent Labs Lab 08/10/14 1602 08/11/14 0549  NA  136 138  K 4.7 4.0  CL 105 105  CO2 24 26  GLUCOSE 97 86  BUN 23* 17  CREATININE 0.93 0.87  CALCIUM 8.6* 8.2*   Liver Function Tests: No results for input(s): AST, ALT, ALKPHOS, BILITOT, PROT, ALBUMIN in the last 168 hours. No results for input(s): LIPASE, AMYLASE in the last 168 hours. No results for input(s): AMMONIA in the last 168 hours. CBC:  Recent Labs Lab 08/10/14 1602 08/11/14 0549  WBC 7.7 5.1  HGB 13.9 13.4  HCT 41.3 39.9  MCV 86.9 86.6  PLT 139* 126*   Cardiac Enzymes: No results for input(s): CKTOTAL, CKMB, CKMBINDEX, TROPONINI in the last 168 hours. BNP: BNP (last 3 results) No results for input(s): BNP in the last 8760 hours.  ProBNP (last 3 results) No results for input(s): PROBNP in the last 8760 hours.  CBG: No results for input(s): GLUCAP in the last 168 hours.     SignedJeralyn Bennett  [facility] 08/16/2014, 12:29 PM

## 2014-08-23 ENCOUNTER — Encounter: Payer: Self-pay | Admitting: Family Medicine

## 2014-08-23 ENCOUNTER — Ambulatory Visit (INDEPENDENT_AMBULATORY_CARE_PROVIDER_SITE_OTHER): Payer: PPO | Admitting: Family Medicine

## 2014-08-23 VITALS — BP 128/70 | HR 74 | Temp 97.6°F | Wt 276.0 lb

## 2014-08-23 DIAGNOSIS — M25552 Pain in left hip: Secondary | ICD-10-CM

## 2014-08-23 DIAGNOSIS — R938 Abnormal findings on diagnostic imaging of other specified body structures: Secondary | ICD-10-CM | POA: Diagnosis not present

## 2014-08-23 DIAGNOSIS — R7989 Other specified abnormal findings of blood chemistry: Secondary | ICD-10-CM

## 2014-08-23 DIAGNOSIS — D696 Thrombocytopenia, unspecified: Secondary | ICD-10-CM | POA: Diagnosis not present

## 2014-08-23 DIAGNOSIS — R9389 Abnormal findings on diagnostic imaging of other specified body structures: Secondary | ICD-10-CM

## 2014-08-23 NOTE — Progress Notes (Signed)
Subjective:    Patient ID: [name], male    DOB: 1940-06-16, 74 y.o.   MRN: [id number]  HPI Patient has a long history of Parkinson's disease. He had recent brief hospitalization for one day- admitted 08/10/2014 following a fall at home. He apparently had recurrent falls over the past few months and is followed regularly by neurology. On the day of admission, he fell in the bathroom and apparently was pinned between a cabinet and wall and was in one position for about hour and a half before he called EMS. He eventually was assisted but had difficulty bearing weight initially.  He had initial CT of the head which did not show any acute findings. Subsequent MRI brain no acute abnormalities. He had MRI of the spine which did not show any acute pathology. He showed gradual improvement and the day following admission and was able to ambulate with a walker. Arrangements were made for follow-up with his neurologist as well as home physical therapy and occupational therapy. He had several abnormalities which need to be followed up  as follows:  Chest x-ray showed 7 x 5 mm nodular opacity periphery right upper lobe. Patient smoked briefly many years ago. No recent cough. No reported weight changes or appetite changes. No dyspnea.  Mild thrombocytopenia with platelet count 126,000. No history of reported previous thrombocytopenia. Hemoglobin and white count were normal  Minimally elevated TSH. No history of known hypothyroidism  Recent left hip pain. MRI hip revealed no acute abnormality. No fracture. Small amount of fluid left trochanteric bursa. His hip pain is improving and he states minimal this time. Very difficult to localized and no reproducible tenderness over the bursa region.  Past Medical History  Diagnosis Date  . UTI (urinary tract infection)   . Parkinson disease   . Venous stasis     edema  . Obesity   . Hypothyroidism   . Morbid obesity 07/14/2012   Past Surgical History    Procedure Laterality Date  . Cervical laminectomy    . Foot surgery  1992    right foot repair  . Appendectomy    . Anterior cervical discectomy c6-7      reports that he quit smoking about 22 years ago. His smoking use included Cigarettes. He quit after 2 years of use. He has never used smokeless tobacco. He reports that he does not drink alcohol or use illicit drugs. family history includes Alcohol abuse in his other; Cancer in his sister; Colon cancer in his father; Heart disease in his mother; Hyperlipidemia in his mother. No Known Allergies    Review of Systems  Constitutional: Negative for fever, chills, appetite change and unexpected weight change.  HENT: Negative for trouble swallowing.   Cardiovascular: Negative for chest pain.  Gastrointestinal: Negative for nausea, vomiting, abdominal pain and diarrhea.  Endocrine: Negative for polydipsia and polyuria.  Genitourinary: Negative for dysuria.  Neurological: Negative for dizziness and syncope.  Psychiatric/Behavioral: Negative for confusion.       Objective:   Physical Exam  Constitutional: He is oriented to person, place, and time. He appears well-developed and well-nourished.  HENT:  Head: Normocephalic and atraumatic.  Neck: Neck supple.  Cardiovascular: Normal rate and regular rhythm.   Pulmonary/Chest: Effort normal and breath sounds normal. No respiratory distress. He has no wheezes. He has no rales.  Musculoskeletal:  Trace edema legs bilaterally which apparently is chronic No tenderness around the left hip region. Left straight leg raise is negative.  Neurological:  He is alert and oriented to person, place, and time. No cranial nerve deficit.  Full-strength with plantar flexion and dorsiflexion left side. Good strength with knee extension.  Psychiatric: He has a normal mood and affect. His behavior is normal.          Assessment & Plan:  #1 recent fall with left hip pain. X-rays revealed no fracture. Hip  pain improving. No evidence clinically for significant bursitis.  MRI showed no fracture.  He is not requesting anything for pain at this time.  Would avoid opioids with history of recurrent falls. #2 mild thrombocytopenia. Ordered future labs with repeat CBC in 3 weeks  #3 mildly elevated TSH. Repeat TSH with free T4 in 3 weeks #4 nonspecific finding on chest x-ray with 7 x 5 mm nodular opacity right upper lobe. Repeat chest x-ray in 3 months as per recommendations of radiology. Patient asymptomatic and suspect this is incidental finding

## 2014-08-23 NOTE — Patient Instructions (Signed)
Get follow up CXR in 3 months Schedule labs for about 3 weeks.

## 2014-08-23 NOTE — Progress Notes (Signed)
Pre visit review using our clinic review tool, if applicable. No additional management support is needed unless otherwise documented below in the visit note. 

## 2014-11-20 ENCOUNTER — Other Ambulatory Visit: Payer: Self-pay | Admitting: Neurology

## 2014-11-20 NOTE — Telephone Encounter (Signed)
Has appt scheduled.

## 2014-12-04 ENCOUNTER — Telehealth: Payer: Self-pay | Admitting: Neurology

## 2014-12-04 ENCOUNTER — Ambulatory Visit: Payer: PPO | Admitting: Neurology

## 2014-12-04 ENCOUNTER — Ambulatory Visit (INDEPENDENT_AMBULATORY_CARE_PROVIDER_SITE_OTHER): Payer: PPO | Admitting: Neurology

## 2014-12-04 ENCOUNTER — Encounter: Payer: Self-pay | Admitting: Neurology

## 2014-12-04 VITALS — BP 128/68 | HR 70 | Resp 18 | Ht 74.0 in | Wt 273.0 lb

## 2014-12-04 DIAGNOSIS — G20 Parkinson's disease: Secondary | ICD-10-CM | POA: Diagnosis not present

## 2014-12-04 MED ORDER — RASAGILINE MESYLATE 1 MG PO TABS: 1.0000 mg | ORAL_TABLET | Freq: Every day | ORAL | Status: DC

## 2014-12-04 MED ORDER — CARBIDOPA-LEVODOPA-ENTACAPONE 25-100-200 MG PO TABS: ORAL_TABLET | ORAL | Status: DC

## 2014-12-04 NOTE — Telephone Encounter (Signed)
Pt called, I scheduled appt

## 2014-12-04 NOTE — Progress Notes (Signed)
Subjective:    Patient ID: [name] is a 74 y.o. male.  HPI     Interim history:   [name] is a very pleasant 74 year old left-handed gentleman with an underlying medical history of degenerative disc disease, alcoholism in the past, history of syncope, obesity, remote history of pneumonia, status post neck surgery at C6-7, who presents for followup consultation of his advanced right-sided predominant Parkinson's disease, complicated by dyskinesias, and prior medication intolerances. He is unaccompanied today. I last saw him on 05/22/2014, at which time he reported no recent falls. He was using his cane usually after 5 PM when he would become more dyskinetic. He did admit to avoid driving at [place] and [place]. He felt that symptoms were stable. He was trying to go to the gym regularly for exercise. In the interim, he presented to the emergency room and was hospitalized for a day on 08/10/2014 2 08/11/2014 secondary to left leg weakness and recurrent falls and progressive decline. He had workup with a head CT and MRI which I reviewed: CT head without contrast on 08/10/2014 showed:  No acute intracranial pathology.   Mild age-related atrophy and chronic microvascular ischemic disease.   If symptoms persist and there are no contraindications, MRI may provide better evaluation if clinically indicated.  MRI brain without contrast on 08/10/2014 showed:  1. No acute intracranial abnormality. Mild for age nonspecific cerebral white matter signal changes. 2. Chronic cervical spinal stenosis, that seen at C3-C4 appears grossly stable since 2011. In addition, I personally reviewed the images through the PACS system. I agree with the findings.  Left hip x-ray on 08/10/2014 was negative. Left hip MRI on 08/10/2014 showed: No acute abnormality.  Negative for fracture.   Small amount of fluid in the left trochanteric bursa compatible with bursitis.   Today, 12/04/2014: He reports doing,  reasonably well, no falls since June. Using his walker more consistently.  He tries to drink enough water. His wife works full-time as a Designer, jewellery. He has a male aide come in twice a week for about 6 hours to help him with his laundry, showering and other chores.   Previously:    10/26/2013, at which time his wife reported a fall in July. She was supposed to start a new job and was worried about leaving him alone during the day and requested a letter from me on behalf of his [name], [name], to help him transfer through [name], [name] to [name], [name], so he could stay with them. I kept him on Stalevo. I restarted him on Azilect as an adjunct. Of note, he canceled an appointment with me on 03/07/2014.    I saw him on 01/11/13, at which time I counseled him regarding staying within the prescribed amount of Stalevo. I kept him on Stalevo 6 times a day. I suggested adding Azilect. We talked about his prior diagnosis of obstructive sleep apnea in 2008 and I suggested he be retested for sleep apnea and ordered a sleep study. However, he did not come back for the sleep study, as he did not feel like he wanted to do it. Of note, he no showed for an appointment on 07/12/2013, as he did not have a ride.    He fell on 09/02/13 and hit his head and went to the ER, where he had [name], which I reviewed: 1. No acute intracranial abnormality. 2. Stable moderate generalized atrophy and mild to moderate chronic microvascular ischemic changes of the  white matter. He stopped the Azilect, as he did not feel better.      I first met him on 07/14/2012, at which time I felt he was stable and it did not make any medication changes. He requested a letter to excuse him from jury duty which I provided. In the interim, I was notified that he was trying to fill his Stalevo prescription earlier than scheduled and perhaps overusing Stalevo. The pharmacy note from [name] stated that he presented there  demanding a refill and threatening to go to the emergency room if he would not get his prescription.    He previously followed with Dr. Morene Greene and was last seen by him on 02/25/2012, at which time Dr. Erling Greene felt that the patient was doing well. He was exercising regularly.   He was diagnosed with right-sided predominant Parkinson's disease in May 1996 by Dr. Morrell Greene. He was on selegiline 5 mg in the beginning and in 1999 he was started on amantadine. He discontinued his these medications and started Mirapex but had side effects and discontinued it after a month. He started seeing Dr. Erling Greene in June 2000 and was re-started on selegiline and amantadine. He was then started on Permax. This was changed and 2005 to Stalevo and Requip was added but discontinued because of excessive daytime somnolence. He has a history of obesity and lost 60 pounds with an exercise program. He has had some memory loss. He had neck surgery in 1996. A sleep study in April 2008 showed hypopneas. In January of this year his MMSE was 29, clock drawing was 4, animal fluency was 13.   He goes to [employer] 3 times a week on M/W/F. He lives with his wife in his own 2 storey home. His bedroom is downstairs. He snores and therefore, sleeps in a separate bedroom. His wife is in school to become a NP. He has fallen in the past.   I reviewed his sleep study from 05/27/06: AHI was 10.1, based on 62 hypopneas and his REM AHI 27.1/h, his O2 nadir was 78% and he spent 17.6 minutes below the saturation of 88%. It was recommended that he come back for CPAP titration but that never came to fruition for one reason or another. He stated that he was told he did not have to be treated with CPAP.   His Past Medical History Is Significant For: Past Medical History  Diagnosis Date  . UTI (urinary tract infection)   . Parkinson disease ([name])   . Venous stasis     edema  . Obesity   . Hypothyroidism   . Morbid obesity ([name]) 07/14/2012    His Past Surgical  History Is Significant For: Past Surgical History  Procedure Laterality Date  . Cervical laminectomy    . Foot surgery  1992    right foot repair  . Appendectomy    . Anterior cervical discectomy c6-7      His Family History Is Significant For: Family History  Problem Relation Age of Onset  . Heart disease Mother   . Hyperlipidemia Mother   . Colon cancer Father   . Alcohol abuse Other   . Cancer Sister     breast     His Social History Is Significant For: Social History   Social History  . Marital Status: Married    Spouse Name: Velva Harman  . Number of Children: 3  . Years of Education: college   Occupational History  .  Social History Main Topics  . Smoking status: Former Smoker -- 2 years    Types: Cigarettes    Quit date: 03/26/1992  . Smokeless tobacco: Never Used  . Alcohol Use: No  . Drug Use: No  . Sexual Activity: Not Asked   Other Topics Concern  . None   Social History Narrative   Regular exercise-No   Retired,is left handed and resides with wife    His Allergies Are:  No Known Allergies:   His Current Medications Are:  Outpatient Encounter Prescriptions as of 12/04/2014  Medication Sig  . calcium carbonate (TUMS - DOSED IN MG ELEMENTAL CALCIUM) 500 MG chewable tablet Chew 1-2 tablets by mouth 3 (three) times daily.  . carbidopa-levodopa-entacapone (STALEVO) 25-100-200 MG per tablet TAKE 1 TABLET BY MOUTH 6 TIMES DAILY  . carbidopa-levodopa-entacapone (STALEVO) 25-100-200 MG tablet TAKE 1 TABLET BY MOUTH 6 TIMES DAILY  . ibuprofen (ADVIL,MOTRIN) 200 MG tablet Take 400 mg by mouth every 6 (six) hours as needed for moderate pain.  . rasagiline (AZILECT) 1 MG TABS tablet Take 1 tablet (1 mg total) by mouth daily.   No facility-administered encounter medications on file as of 12/04/2014.  :  Review of Systems:  Out of a complete 14 point review of systems, all are reviewed and negative with the exception of these symptoms as listed below:    Review of Systems  Neurological:       No new concerns per patient.     Objective:  Neurologic Exam  Physical Exam Physical Examination:   Filed Vitals:   12/04/14 0833  BP: 128/68  Pulse: 70  Resp: 18    General Examination: The patient is a very pleasant 74 y.o. male in no acute distress. He is morbidly obese, but has lost weight since his sleep study (was 300 lb at the time) and is 273 lb today.   HEENT: Normocephalic, atraumatic, pupils are equal, round and reactive to light and accommodation. Extraocular tracking shows mild saccadic breakdown without nystagmus noted. There is limitation to upper gaze. There is mild decrease in eye blink rate. Hearing is intact. Face is symmetric with moderate facial masking and mouth is open most of the time with normal facial sensation and mild facial dyskinesias. There is no lip, neck or jaw tremor. Neck is moderately rigid with intact passive ROM. There are no carotid bruits on auscultation. Oropharynx exam reveals no significant mouth dryness. No significant airway crowding is noted. Mallampati is class II. Tongue protrudes centrally and palate elevates symmetrically. There is minimal drooling.   Chest: is clear to auscultation without wheezing, rhonchi or crackles noted.  Heart: sounds are regular and normal without murmurs, rubs or gallops noted.   Abdomen: is soft, non-tender and non-distended with normal bowel sounds appreciated on auscultation.  Extremities: There is trace pitting edema in the distal lower extremities bilaterally, better. Chronic stasis-like changes are noted in the distal legs bilaterally. There are no varicose veins, but spider veins are present and unchanged.  Skin: is warm and dry with no trophic changes noted. Age-related changes are noted on the skin.   Musculoskeletal: exam reveals no obvious joint deformities, tenderness, joint swelling or erythema.  Neurologically:   Mental status: The patient is awake and  alert, paying good  attention. He is able to completely provide the history. He is oriented to: person, place, situation, day of week, month of year and year. His memory, attention, language and knowledge are fairly well intact. There is no aphasia,  agnosia, apraxia or anomia. There is a no significant degree of bradyphrenia. Speech is moderately hypophonic with mild dysarthria noted. Mood is congruent and affect is normal.   Cranial nerves are as described above under HEENT exam. In addition, shoulder shrug is normal with equal shoulder height noted.  Motor exam: Normal bulk, and strength for age is noted. There are mild generalized dyskinesias noted. Tone is mildly rigid with presence of cogwheeling in the bilateral upper extremities. There is overall moderate bradykinesia. There is no drift or rebound.  There is no tremor. Romberg is negative.  Reflexes are trace in the upper extremities and absent in the lower extremities.   Fine motor skills exam: Finger taps are moderately impaired on the right and mild to moderately impaired on the left. Hand movements are moderately impaired on the right and mildly impaired on the left. RAP (rapid alternating patting) is moderately impaired on the right and mildly impaired on the left. Foot taps are moderately impaired on the right and moderately impaired on the left. Foot agility (in the form of heel stomping) is moderately impaired on the right and mildly impaired on the left.    Cerebellar testing shows no dysmetria or intention tremor on finger to nose testing. There is no truncal or gait ataxia.   Sensory exam is intact to light touch in the upper and lower extremities.   Gait, station and balance: He stands up from the seated position with moderate difficulty and requires 2 attempts. He requires no assistance. He has to push up with his hands. No veering to one side is noted. He is noted to lean to the right. Posture is mild to moderately stooped. Stance  is wide-based. He walks with decrease in stride length and pace and decreased arm swing and more pronounced dyskinesias in his upper extremities. He turns in 4 steps. He walks with a waddle. Tandem walk is not possible. Balance is mild to moderately impaired. He is not able to do a toe or heel stance. He walks with his rolling walker.     Assessment and Plan:   In summary, [name] is a very pleasant 74 year old male with a history of advanced Parkinson's disease, right-sided predominant, complicated by obesity, dyskinesias, and medication intolerances in the past. He has been tried on amantadine, Permax, Mirapex, selegiline, and Requip in the past. He has been reasonably stable on the current regimen. Unfortunately, he did take a fall in June and had to go to the emergency room. Thankfully he did not sustain any serious injuries. He has at times taken more than prescribed Stalevo, but  not recently. I suggested we continue with the current regimen of Stalevo 6 times a day at 4 hourly intervals and Azilect 1 mg once daily. He is also advised to stay well-hydrated and use his walker at all times as he is at fall risk.  I renewed his prescriptions today. I would like to see him back in 4 months, sooner if the need arises. I answered all his questions today and he was in agreement. I spent 20 minutes in total face-to-face time with the patient, more than 50% of which was spent in counseling and coordination of care, reviewing test results, reviewing medication and discussing or reviewing the diagnosis of PD, its prognosis and treatment options.

## 2014-12-04 NOTE — Telephone Encounter (Signed)
Called pt to make 4 month f/u that was in notes.  Pt never stopped by check-out before leaving.-SLB

## 2014-12-04 NOTE — Patient Instructions (Signed)
You are at fall risk, use your walker at all times.   I think your Parkinson's disease has remained fairly stable, which is reassuring. Nevertheless, as you know, this disease does progress with time. It can affect your balance, your memory, your mood, your bowel and bladder function, your posture, balance and walking and your activities of daily living. However, there are good supportive treatments and symptomatic treatments available, so most patients have a change to a good quality life and life expectancy is not typically altered. Overall you are doing fairly well but I do want to suggest a few things today:  Remember to drink plenty of fluid at least 6 glasses (8 oz each), eat healthy meals and do not skip any meals. Try to eat protein with a every meal and eat a healthy snack such as fruit or nuts in between meals. Try to keep a regular sleep-wake schedule and try to exercise daily, particularly in the form of walking, 10-20 minutes a day, if you can.   Taking your medication on schedule is key.   Try to stay active physically and mentally. Engage in social activities in your community and with your family and try to keep up with current events by reading the newspaper or watching the news. Try to do word puzzles and you may like to do puzzles and brain games on the computer such as on [email or web address].   As far as your medications are concerned, I would like to suggest that you take your current medication with the following additional changes: no changes.   I would like to see you back in 4 months, sooner if we need to. Please call us with any interim questions, concerns, problems, updates or refill requests.  You can email me through my chart and also leave a phone message for Lafonda Mossesiana, my nurse.

## 2015-01-08 ENCOUNTER — Encounter: Payer: Self-pay | Admitting: Family Medicine

## 2015-01-08 ENCOUNTER — Ambulatory Visit (INDEPENDENT_AMBULATORY_CARE_PROVIDER_SITE_OTHER): Payer: PPO | Admitting: Family Medicine

## 2015-01-08 VITALS — BP 110/70 | HR 72 | Temp 98.2°F | Resp 14 | Ht 74.0 in | Wt 271.1 lb

## 2015-01-08 DIAGNOSIS — L03116 Cellulitis of left lower limb: Secondary | ICD-10-CM | POA: Diagnosis not present

## 2015-01-08 DIAGNOSIS — Z23 Encounter for immunization: Secondary | ICD-10-CM

## 2015-01-08 MED ORDER — CEPHALEXIN 500 MG PO CAPS: 500.0000 mg | ORAL_CAPSULE | Freq: Three times a day (TID) | ORAL | Status: DC

## 2015-01-08 NOTE — Progress Notes (Signed)
Pre visit review using our clinic review tool, if applicable. No additional management support is needed unless otherwise documented below in the visit note. 

## 2015-01-08 NOTE — Patient Instructions (Signed)

## 2015-01-08 NOTE — Progress Notes (Signed)
   Subjective:    Patient ID: [name], male    DOB: 01/14/1941, 74 y.o.   MRN: [id number]  HPI Patient seen with possible cellulitis left leg. Onset yesterday of some warmth and redness left leg. This is worse this morning but is improved somewhat through the day. No fevers or chills. No history of recent cellulitis. No history of diabetes. No known drug allergies. No prior history of MRSA.  Past Medical History  Diagnosis Date  . UTI (urinary tract infection)   . Parkinson disease ([facility])   . Venous stasis     edema  . Obesity   . Hypothyroidism   . Morbid obesity ([facility]) 07/14/2012   Past Surgical History  Procedure Laterality Date  . Cervical laminectomy    . Foot surgery  1992    right foot repair  . Appendectomy    . Anterior cervical discectomy c6-7      reports that he quit smoking about 22 years ago. His smoking use included Cigarettes. He quit after 2 years of use. He has never used smokeless tobacco. He reports that he does not drink alcohol or use illicit drugs. family history includes Alcohol abuse in his other; Cancer in his sister; Colon cancer in his father; Heart disease in his mother; Hyperlipidemia in his mother. No Known Allergies    Review of Systems  Constitutional: Negative for fever and chills.       Objective:   Physical Exam  Constitutional: He appears well-developed and well-nourished.  Cardiovascular: Normal rate and regular rhythm.   Pulmonary/Chest: Effort normal and breath sounds normal. No respiratory distress. He has no wheezes. He has no rales.  Skin:  Patient has area of erythema left anterior leg somewhat in a linear streak. No warmth. Nontender. No edema. Foot is warm to touch with good capillary refill          Assessment & Plan:  Probable early cellulitis left leg. Elevation. Heating pad several times daily. Keflex 500 mg 3 times a day for 10 days. Follow-up if not resolving next few days

## 2015-02-19 MED FILL — CARBIDOPA-LEVODOPA-ENTA 100: 25-100-200 | 30 days supply | Qty: 180 | Fill #2

## 2015-03-01 ENCOUNTER — Emergency Department (HOSPITAL_BASED_OUTPATIENT_CLINIC_OR_DEPARTMENT_OTHER)
Admission: EM | Admit: 2015-03-01 | Discharge: 2015-03-01 | Disposition: A | Discharge status: Home / Self Care | Payer: PPO | Attending: Emergency Medicine | Admitting: Emergency Medicine

## 2015-03-01 ENCOUNTER — Encounter (HOSPITAL_BASED_OUTPATIENT_CLINIC_OR_DEPARTMENT_OTHER): Payer: Self-pay | Admitting: *Deleted

## 2015-03-01 ENCOUNTER — Emergency Department (HOSPITAL_BASED_OUTPATIENT_CLINIC_OR_DEPARTMENT_OTHER): Payer: PPO

## 2015-03-01 DIAGNOSIS — Z87891 Personal history of nicotine dependence: Secondary | ICD-10-CM | POA: Insufficient documentation

## 2015-03-01 DIAGNOSIS — Z87440 Personal history of urinary (tract) infections: Secondary | ICD-10-CM | POA: Diagnosis not present

## 2015-03-01 DIAGNOSIS — W19XXXA Unspecified fall, initial encounter: Secondary | ICD-10-CM

## 2015-03-01 DIAGNOSIS — G20 Parkinson's disease: Secondary | ICD-10-CM | POA: Diagnosis not present

## 2015-03-01 DIAGNOSIS — S0031XA Abrasion of nose, initial encounter: Secondary | ICD-10-CM | POA: Diagnosis not present

## 2015-03-01 DIAGNOSIS — S0992XA Unspecified injury of nose, initial encounter: Secondary | ICD-10-CM | POA: Diagnosis not present

## 2015-03-01 DIAGNOSIS — Z8679 Personal history of other diseases of the circulatory system: Secondary | ICD-10-CM | POA: Insufficient documentation

## 2015-03-01 DIAGNOSIS — T148XXA Other injury of unspecified body region, initial encounter: Secondary | ICD-10-CM

## 2015-03-01 DIAGNOSIS — R040 Epistaxis: Secondary | ICD-10-CM | POA: Diagnosis not present

## 2015-03-01 DIAGNOSIS — W01198A Fall on same level from slipping, tripping and stumbling with subsequent striking against other object, initial encounter: Secondary | ICD-10-CM | POA: Insufficient documentation

## 2015-03-01 DIAGNOSIS — Z79899 Other long term (current) drug therapy: Secondary | ICD-10-CM | POA: Diagnosis not present

## 2015-03-01 DIAGNOSIS — Y9301 Activity, walking, marching and hiking: Secondary | ICD-10-CM | POA: Insufficient documentation

## 2015-03-01 DIAGNOSIS — Y9289 Other specified places as the place of occurrence of the external cause: Secondary | ICD-10-CM | POA: Diagnosis not present

## 2015-03-01 DIAGNOSIS — R05 Cough: Secondary | ICD-10-CM | POA: Diagnosis not present

## 2015-03-01 DIAGNOSIS — Y998 Other external cause status: Secondary | ICD-10-CM | POA: Diagnosis not present

## 2015-03-01 DIAGNOSIS — J3489 Other specified disorders of nose and nasal sinuses: Secondary | ICD-10-CM | POA: Diagnosis not present

## 2015-03-01 MED ORDER — AMOXICILLIN-POT CLAVULANATE 500-125 MG PO TABS: 1.0000 | ORAL_TABLET | Freq: Three times a day (TID) | ORAL | Status: DC

## 2015-03-01 MED ORDER — ALBUTEROL SULFATE HFA 108 (90 BASE) MCG/ACT IN AERS: 1.0000 | INHALATION_SPRAY | Freq: Four times a day (QID) | RESPIRATORY_TRACT | Status: DC | PRN

## 2015-03-01 NOTE — ED Notes (Signed)
Pt to triage in w/c reporting he pushed open a door, and it opened very quickly, he fell through it onto his face. Denies any other injury or trauma.

## 2015-03-01 NOTE — ED Notes (Signed)
Pt states he was reaching for the door and missed the handle and fell and hit his nose on the carpet.  Abrasion to the bridge of his nose, no significant tenderness to face and nose does not appear very swollen or crooked.  Pt denies LOC.

## 2015-03-01 NOTE — ED Notes (Signed)
Patient transported to X-ray 

## 2015-03-01 NOTE — Discharge Instructions (Signed)
Abrasion An abrasion is a cut or scrape on the outer surface of your skin. An abrasion does not extend through all of the layers of your skin. It is important to care for your abrasion properly to prevent infection. CAUSES Most abrasions are caused by falling on or gliding across the ground or another surface. When your skin rubs on something, the outer and inner layer of skin rubs off.  SYMPTOMS A cut or scrape is the main symptom of this condition. The scrape may be bleeding, or it may appear red or pink. If there was an associated fall, there may be an underlying bruise. DIAGNOSIS An abrasion is diagnosed with a physical exam. TREATMENT Treatment for this condition depends on how large and deep the abrasion is. Usually, your abrasion will be cleaned with water and mild soap. This removes any dirt or debris that may be stuck. An antibiotic ointment may be applied to the abrasion to help prevent infection. A bandage (dressing) may be placed on the abrasion to keep it clean. You may also need a tetanus shot. HOME CARE INSTRUCTIONS Medicines  Take or apply medicines only as directed by your health care provider.  If you were prescribed an antibiotic ointment, finish all of it even if you start to feel better. Wound Care  Clean the wound with mild soap and water 2-3 times per day or as directed by your health care provider. Pat your wound dry with a clean towel. Do not rub it.  There are many different ways to close and cover a wound. Follow instructions from your health care provider about:  Wound care.  Dressing changes and removal.  Check your wound every day for signs of infection. Watch for:  Redness, swelling, or pain.  Fluid, blood, or pus. General Instructions  Keep the dressing dry as directed by your health care provider. Do not take baths, swim, use a hot tub, or do anything that would put your wound underwater until your health care provider approves.  If there is  swelling, raise (elevate) the injured area above the level of your heart while you are sitting or lying down.  Keep all follow-up visits as directed by your health care provider. This is important. SEEK MEDICAL CARE IF:  You received a tetanus shot and you have swelling, severe pain, redness, or bleeding at the injection site.  Your pain is not controlled with medicine.  You have increased redness, swelling, or pain at the site of your wound. SEEK IMMEDIATE MEDICAL CARE IF:  You have a red streak going away from your wound.  You have a fever.  You have fluid, blood, or pus coming from your wound.  You notice a bad smell coming from your wound or your dressing.   This information is not intended to replace advice given to you by your health care provider. Make sure you discuss any questions you have with your health care provider.   Document Released: 11/13/2004 Document Revised: 10/25/2014 Document Reviewed: 02/01/2014 Elsevier Interactive Patient Education 2016 [employer]  

## 2015-03-01 NOTE — ED Notes (Signed)
Pt has a congested cough that he has had for weeks.

## 2015-03-01 NOTE — ED Notes (Signed)
Pt returned from xray, Dr. Radford PaxBeaton in room.

## 2015-03-01 NOTE — ED Notes (Signed)
Pt and family verbalize understanding of dc instructions and deny any further needs at this time 

## 2015-03-01 NOTE — ED Provider Notes (Signed)
CSN: [id number]     Arrival date & time 03/01/15  1836 History   By signing my name below, I, Arlan Organ, attest that this documentation has been prepared under the direction and in the presence of Nelva Nay, MD.  Electronically Signed: Arlan Organ, ED Scribe. 03/01/2015. 10:06 PM.   Chief Complaint  Patient presents with  . Fall   HPI  HPI Comments: [name] is a 75 y.o. male with a PMHx of Parkinson's disease who presents to the Emergency Department here after a fall sustained just prior to arrival. Pt states he was walking through a doorway, lost his footing as he tripped and fell to the ground hitting his nose on some carpeting. No LOC at time of incident. Pt now presents with a scratch to the bridge of the nose. Bleeding controlled at this time. He denies any pain. No associated symptoms. No OTC medications or home remedies attempted prior to arrival. No attempts to cleanse the area at home. Pt denies any recent fever, chills, nausea, vomiting, abdominal pain, or chest pain. No weakness, loss of sensation, or numbness.  PCP: Kristian Covey, MD    Past Medical History  Diagnosis Date  . UTI (urinary tract infection)   . Parkinson disease (HCC)   . Venous stasis     edema  . Obesity   . Hypothyroidism   . Morbid obesity (HCC) 07/14/2012   Past Surgical History  Procedure Laterality Date  . Cervical laminectomy    . Foot surgery  1992    right foot repair  . Appendectomy    . Anterior cervical discectomy c6-7     Family History  Problem Relation Age of Onset  . Heart disease Mother   . Hyperlipidemia Mother   . Colon cancer Father   . Alcohol abuse Other   . Cancer Sister     breast    Social History  Substance Use Topics  . Smoking status: Former Smoker -- 2 years    Types: Cigarettes    Quit date: 03/26/1992  . Smokeless tobacco: Never Used  . Alcohol Use: No    Review of Systems   A complete 10 system review of systems was obtained and all  systems are negative except as noted in the HPI and PMH.    Allergies  Review of patient's allergies indicates no known allergies.  Home Medications   Prior to Admission medications   Medication Sig Start Date End Date Taking? Authorizing Provider  calcium carbonate (TUMS - DOSED IN MG ELEMENTAL CALCIUM) 500 MG chewable tablet Chew 1-2 tablets by mouth 3 (three) times daily.    Historical Provider, MD  carbidopa-levodopa-entacapone (STALEVO) 25-100-200 MG per tablet TAKE 1 TABLET BY MOUTH 6 TIMES DAILY 10/26/13   Huston Foley, MD  carbidopa-levodopa-entacapone (STALEVO) 25-100-200 MG tablet TAKE 1 TABLET BY MOUTH 6 TIMES DAILY 12/04/14   Huston Foley, MD  cephALEXin (KEFLEX) 500 MG capsule Take 1 capsule (500 mg total) by mouth 3 (three) times daily. 01/08/15   Kristian Covey, MD  ibuprofen (ADVIL,MOTRIN) 200 MG tablet Take 400 mg by mouth every 6 (six) hours as needed for moderate pain.    Historical Provider, MD  rasagiline (AZILECT) 1 MG TABS tablet Take 1 tablet (1 mg total) by mouth daily. 12/04/14   Huston Foley, MD   Triage Vitals: BP 126/70 mmHg  Pulse 57  Temp(Src) 97.4 F (36.3 C) (Oral)  Resp 18  Ht 6\' 2"  (1.88 m)  Wt 265 lb (  120.203 kg)  BMI 34.01 kg/m2  SpO2 98%   Physical Exam  Constitutional: He is oriented to person, place, and time. He appears well-developed and well-nourished. No distress.  HENT:  Head: Normocephalic.  Nose:    Eyes: Pupils are equal, round, and reactive to light.  Neck: Normal range of motion.  Cardiovascular: Normal rate and intact distal pulses.   Pulmonary/Chest: No respiratory distress.  Abdominal: Normal appearance. He exhibits no distension.  Musculoskeletal: Normal range of motion.  Neurological: He is alert and oriented to person, place, and time. No cranial nerve deficit.  Skin: Skin is warm and dry. No rash noted.  Psychiatric: He has a normal mood and affect.  Nursing note and vitals reviewed.   ED Course  Procedures  (including critical care time)  DIAGNOSTIC STUDIES: Oxygen Saturation is 98% on RA, Normal by my interpretation.    COORDINATION OF CARE: 9:54 PM- Will order CXR and DG nasal bones. Discussed treatment plan with pt at bedside and pt agreed to plan.     Labs Review Labs Reviewed - No data to display  Imaging Review Dg Nasal Bones  03/01/2015  CLINICAL DATA:  Fall today with nose injury. Nasal pain and bleeding. EXAM: NASAL BONES - 3+ VIEW COMPARISON:  None. FINDINGS: There is no evidence of fracture or other bone abnormality. No radiopaque foreign body. IMPRESSION: No nasal bone fracture is seen. Electronically Signed   By: Rubye OaksMelanie  Ehinger M.D.   On: 03/01/2015 22:25   Dg Chest 2 View  03/01/2015  CLINICAL DATA:  Cough for a few days. EXAM: CHEST  2 VIEW COMPARISON:  08/10/2014 FINDINGS: Borderline mild cardiomegaly with unchanged mediastinal contours. The right upper lobe nodular opacity on prior radiograph is not confidently seen. Pulmonary vasculature is normal. No consolidation, pleural effusion, or pneumothorax. No acute osseous abnormalities are seen. IMPRESSION: 1. No acute process. 2. The previous nodular opacity in the right upper lung is not confidently visualized. Electronically Signed   By: Rubye OaksMelanie  Ehinger M.D.   On: 03/01/2015 22:20   I have personally reviewed and evaluated these images and lab results as part of my medical decision-making.    MDM   Final diagnoses:  Fall, initial encounter  Abrasion    I personally performed the services described in this documentation, which was scribed in my presence. The recorded information has been reviewed and considered.   Nelva Nayobert Devlyn Retter, MD 03/01/15 2236

## 2015-03-02 MED FILL — VENTOLIN HFA 90 MCG INHALER: 108 (90 BAS | 25 days supply | Qty: 18 | Fill #0

## 2015-03-02 MED FILL — AMOX-CLAV 500-125 MG TABLET: 500-125 | 7 days supply | Qty: 21 | Fill #0

## 2015-03-20 MED FILL — CARBIDOPA-LEVODOPA-ENTA 100: 25-100-200 | 30 days supply | Qty: 180 | Fill #3

## 2015-04-10 ENCOUNTER — Telehealth: Payer: Self-pay

## 2015-04-10 ENCOUNTER — Ambulatory Visit: Payer: Self-pay | Admitting: Neurology

## 2015-04-10 NOTE — Telephone Encounter (Signed)
Patient did not show to appt today  

## 2015-04-12 ENCOUNTER — Encounter: Payer: Self-pay | Admitting: Neurology

## 2015-04-18 ENCOUNTER — Ambulatory Visit: Payer: PPO | Admitting: Family Medicine

## 2015-04-27 MED FILL — CARBIDOPA-LEVODOPA-ENTA 100: 25-100-200 | 30 days supply | Qty: 180 | Fill #4

## 2015-05-08 ENCOUNTER — Ambulatory Visit (INDEPENDENT_AMBULATORY_CARE_PROVIDER_SITE_OTHER): Payer: PPO | Admitting: Family Medicine

## 2015-05-08 ENCOUNTER — Encounter: Payer: Self-pay | Admitting: Family Medicine

## 2015-05-08 VITALS — BP 100/58 | HR 76 | Temp 98.3°F | Ht 74.0 in

## 2015-05-08 DIAGNOSIS — N390 Urinary tract infection, site not specified: Secondary | ICD-10-CM

## 2015-05-08 DIAGNOSIS — R509 Fever, unspecified: Secondary | ICD-10-CM

## 2015-05-08 LAB — POCT URINALYSIS DIPSTICK
NITRITE UA: POSITIVE
PH UA: 5
RBC UA: NEGATIVE
SPEC GRAV UA: 1.02
Urobilinogen, UA: 0.2

## 2015-05-08 MED ORDER — CIPROFLOXACIN HCL 500 MG PO TABS: 500.0000 mg | ORAL_TABLET | Freq: Two times a day (BID) | ORAL | Status: DC

## 2015-05-08 MED FILL — CIPROFLOXACIN HCL 500 MG TA: 500 | 14 days supply | Qty: 28 | Fill #0

## 2015-05-08 NOTE — Progress Notes (Signed)
   Subjective:    Patient ID: [name], male    DOB: 07/12/1940, 75 y.o.   MRN: [id number]  HPI  Acute Patient seen today with a complaint of fever, body chills, and dry non-productive cough times one day.  Past medical history includes Parkinson's disease,dysuria, and morbid obesity. Reports going to bed and experiencing chills, rigors, and dyspnea, with a Tmax of 100.5. Wife gave patient Tylenol and is now afebrile.   Denies sore throat, headache, body aches, nausea, or vomiting.   Past Medical History  Diagnosis Date  . UTI (urinary tract infection)   . Parkinson disease (HCC)   . Venous stasis     edema  . Obesity   . Hypothyroidism   . Morbid obesity (HCC) 07/14/2012   Past Surgical History  Procedure Laterality Date  . Cervical laminectomy    . Foot surgery  1992    right foot repair  . Appendectomy    . Anterior cervical discectomy c6-7      reports that he quit smoking about 23 years ago. His smoking use included Cigarettes. He quit after 2 years of use. He has never used smokeless tobacco. He reports that he does not drink alcohol or use illicit drugs. family history includes Alcohol abuse in his other; Cancer in his sister; Colon cancer in his father; Heart disease in his mother; Hyperlipidemia in his mother. No Known Allergies      Review of Systems  Constitutional: Positive for fever, chills and fatigue.  HENT: Positive for rhinorrhea.   Eyes: Negative.   Respiratory: Positive for cough.   Cardiovascular: Negative.   Gastrointestinal: Negative.   Endocrine: Negative.   Genitourinary: Negative.   Musculoskeletal: Negative.   Skin: Negative.   Allergic/Immunologic: Negative.   Neurological: Negative.   Hematological: Negative.   Psychiatric/Behavioral: Negative.        Objective:   Physical Exam  Constitutional: He is oriented to person, place, and time. He appears well-developed and well-nourished.  HENT:  Head: Normocephalic and atraumatic.    Right Ear: External ear normal.  Left Ear: External ear normal.  Nose: Nose normal.  Mouth/Throat: Oropharynx is clear and moist.  Eyes: Conjunctivae and EOM are normal. Pupils are equal, round, and reactive to light.  Neck: Normal range of motion. Neck supple.  Cardiovascular: Normal rate, regular rhythm, normal heart sounds and intact distal pulses.   Pulmonary/Chest: Effort normal and breath sounds normal.  Musculoskeletal: Normal range of motion.  Neurological: He is alert and oriented to person, place, and time.  Skin: Skin is warm and dry.  Psychiatric: He has a normal mood and affect. His behavior is normal. Judgment and thought content normal.  Nursing note and vitals reviewed.         Assessment & Plan:  Febrile illness.  Ocassional cough.  Non-focal exam Rapid Influenza Screening-Negative.   Urinary tract infection likely due to urine dipstick (positive for leukocyte and nitrates). Patient experienced an acute onset of fever (100.5) and chills.  Plan:   Ciprofloxacin 500 mg, one tablet by mouth, twice daily, 14 days. Urine cx sent.  Stay well hydrated.

## 2015-05-08 NOTE — Patient Instructions (Signed)

## 2015-05-08 NOTE — Progress Notes (Signed)
Pre visit review using our clinic review tool, if applicable. No additional management support is needed unless otherwise documented below in the visit note. 

## 2015-05-11 LAB — URINE CULTURE

## 2015-06-04 MED FILL — CARBIDOPA-LEVODOPA-ENTA 100: 25-100-200 | 30 days supply | Qty: 180 | Fill #5

## 2015-07-01 ENCOUNTER — Emergency Department (HOSPITAL_BASED_OUTPATIENT_CLINIC_OR_DEPARTMENT_OTHER): Payer: PPO

## 2015-07-01 ENCOUNTER — Encounter (HOSPITAL_BASED_OUTPATIENT_CLINIC_OR_DEPARTMENT_OTHER): Payer: Self-pay | Admitting: Emergency Medicine

## 2015-07-01 ENCOUNTER — Emergency Department (HOSPITAL_BASED_OUTPATIENT_CLINIC_OR_DEPARTMENT_OTHER)
Admission: EM | Admit: 2015-07-01 | Discharge: 2015-07-01 | Disposition: A | Discharge status: Home / Self Care | Payer: PPO | Attending: Emergency Medicine | Admitting: Emergency Medicine

## 2015-07-01 DIAGNOSIS — S81811A Laceration without foreign body, right lower leg, initial encounter: Secondary | ICD-10-CM | POA: Insufficient documentation

## 2015-07-01 DIAGNOSIS — Y999 Unspecified external cause status: Secondary | ICD-10-CM | POA: Insufficient documentation

## 2015-07-01 DIAGNOSIS — W1809XA Striking against other object with subsequent fall, initial encounter: Secondary | ICD-10-CM | POA: Insufficient documentation

## 2015-07-01 DIAGNOSIS — Y92003 Bedroom of unspecified non-institutional (private) residence as the place of occurrence of the external cause: Secondary | ICD-10-CM | POA: Diagnosis not present

## 2015-07-01 DIAGNOSIS — Y9301 Activity, walking, marching and hiking: Secondary | ICD-10-CM | POA: Diagnosis not present

## 2015-07-01 DIAGNOSIS — G20 Parkinson's disease: Secondary | ICD-10-CM | POA: Diagnosis not present

## 2015-07-01 DIAGNOSIS — Z87891 Personal history of nicotine dependence: Secondary | ICD-10-CM | POA: Diagnosis not present

## 2015-07-01 DIAGNOSIS — E039 Hypothyroidism, unspecified: Secondary | ICD-10-CM | POA: Insufficient documentation

## 2015-07-01 MED ORDER — LIDOCAINE-EPINEPHRINE 2 %-1:100000 IJ SOLN
30.0000 mL | Freq: Once | INTRAMUSCULAR | Status: AC
  Administered 2015-07-01: 20 mL
  Filled 2015-07-01: qty 2

## 2015-07-01 MED ORDER — TETANUS-DIPHTH-ACELL PERTUSSIS 5-2.5-18.5 LF-MCG/0.5 IM SUSP: 0.5000 mL | Freq: Once | INTRAMUSCULAR | Status: DC

## 2015-07-01 MED ORDER — AMOXICILLIN-POT CLAVULANATE 875-125 MG PO TABS
1.0000 | ORAL_TABLET | Freq: Two times a day (BID) | ORAL | Status: DC
  Administered 2015-07-01: 1 via ORAL
  Filled 2015-07-01: qty 1

## 2015-07-01 MED ORDER — AMOXICILLIN-POT CLAVULANATE 875-125 MG PO TABS: 1.0000 | ORAL_TABLET | Freq: Two times a day (BID) | ORAL | Status: DC

## 2015-07-01 NOTE — ED Notes (Signed)
Patient transported to X-ray 

## 2015-07-01 NOTE — ED Notes (Addendum)
Pt reports falling around 7 last night injuring his leg. Pt was walking with his walker and was trying to get into bed, missed the bed and hit his leg on his walker. Pt c/o R leg laceration. Pt wife put steri strips on wound but wound continued to bleed.

## 2015-07-01 NOTE — Discharge Instructions (Signed)
Laceration Care, Adult Take the antibiotic as prescribed. Follow up for suture removal in 1 week. Return to the ED sooner if you develop worsening pain, redness, fever, swelling, bleeding, excessive drainage, or any other concerns. A laceration is a cut that goes through all of the layers of the skin and into the tissue that is right under the skin. Some lacerations heal on their own. Others need to be closed with stitches (sutures), staples, skin adhesive strips, or skin glue. Proper laceration care minimizes the risk of infection and helps the laceration to heal better. HOW TO CARE FOR YOUR LACERATION If sutures or staples were used:  Keep the wound clean and dry.  If you were given a bandage (dressing), you should change it at least one time per day or as told by your health care provider. You should also change it if it becomes wet or dirty.  Keep the wound completely dry for the first 24 hours or as told by your health care provider. After that time, you may shower or bathe. However, make sure that the wound is not soaked in water until after the sutures or staples have been removed.  Clean the wound one time each day or as told by your health care provider:  Wash the wound with soap and water.  Rinse the wound with water to remove all soap.  Pat the wound dry with a clean towel. Do not rub the wound.  After cleaning the wound, apply a thin layer of antibiotic ointmentas told by your health care provider. This will help to prevent infection and keep the dressing from sticking to the wound.  Have the sutures or staples removed as told by your health care provider. If skin adhesive strips were used:  Keep the wound clean and dry.  If you were given a bandage (dressing), you should change it at least one time per day or as told by your health care provider. You should also change it if it becomes dirty or wet.  Do not get the skin adhesive strips wet. You may shower or bathe, but be  careful to keep the wound dry.  If the wound gets wet, pat it dry with a clean towel. Do not rub the wound.  Skin adhesive strips fall off on their own. You may trim the strips as the wound heals. Do not remove skin adhesive strips that are still stuck to the wound. They will fall off in time. If skin glue was used:  Try to keep the wound dry, but you may briefly wet it in the shower or bath. Do not soak the wound in water, such as by swimming.  After you have showered or bathed, gently pat the wound dry with a clean towel. Do not rub the wound.  Do not do any activities that will make you sweat heavily until the skin glue has fallen off on its own.  Do not apply liquid, cream, or ointment medicine to the wound while the skin glue is in place. Using those may loosen the film before the wound has healed.  If you were given a bandage (dressing), you should change it at least one time per day or as told by your health care provider. You should also change it if it becomes dirty or wet.  If a dressing is placed over the wound, be careful not to apply tape directly over the skin glue. Doing that may cause the glue to be pulled off before the wound  has healed.  Do not pick at the glue. The skin glue usually remains in place for 5-10 days, then it falls off of the skin. General Instructions  Take over-the-counter and prescription medicines only as told by your health care provider.  If you were prescribed an antibiotic medicine or ointment, take or apply it as told by your doctor. Do not stop using it even if your condition improves.  To help prevent scarring, make sure to cover your wound with sunscreen whenever you are outside after stitches are removed, after adhesive strips are removed, or when glue remains in place and the wound is healed. Make sure to wear a sunscreen of at least 30 SPF.  Do not scratch or pick at the wound.  Keep all follow-up visits as told by your health care  provider. This is important.  Check your wound every day for signs of infection. Watch for:  Redness, swelling, or pain.  Fluid, blood, or pus.  Raise (elevate) the injured area above the level of your heart while you are sitting or lying down, if possible. SEEK MEDICAL CARE IF:  You received a tetanus shot and you have swelling, severe pain, redness, or bleeding at the injection site.  You have a fever.  A wound that was closed breaks open.  You notice a bad smell coming from your wound or your dressing.  You notice something coming out of the wound, such as wood or glass.  Your pain is not controlled with medicine.  You have increased redness, swelling, or pain at the site of your wound.  You have fluid, blood, or pus coming from your wound.  You notice a change in the color of your skin near your wound.  You need to change the dressing frequently due to fluid, blood, or pus draining from the wound.  You develop a new rash.  You develop numbness around the wound. SEEK IMMEDIATE MEDICAL CARE IF:  You develop severe swelling around the wound.  Your pain suddenly increases and is severe.  You develop painful lumps near the wound or on skin that is anywhere on your body.  You have a red streak going away from your wound.  The wound is on your hand or foot and you cannot properly move a finger or toe.  The wound is on your hand or foot and you notice that your fingers or toes look pale or bluish.   This information is not intended to replace advice given to you by your health care provider. Make sure you discuss any questions you have with your health care provider.   Document Released: 02/03/2005 Document Revised: 06/20/2014 Document Reviewed: 01/30/2014 Elsevier Interactive Patient Education [employer]

## 2015-07-01 NOTE — ED Provider Notes (Signed)
CSN: [id number]     Arrival date & time 07/01/15  [id number] History   First MD Initiated Contact with Patient 07/01/15 0830     Chief Complaint  Patient presents with  . Fall     (Consider location/radiation/quality/duration/timing/severity/associated sxs/prior Treatment) HPI Comments: Patient presents with laceration and injury to right lower leg. Occurred last night approximate 7 PM while he was trying to get into bed and hit his leg on the history of his walker. He did not fall out of bed or injure anything else. Did not hit his head or lose consciousness. No blood thinner use. History of Parkinson's disease and normally walks with a walker. He declined to be seen last night. They came in today because the wound is still bleeding. His family member who is an NP put Steri-Strips on the wound last night. He denies any weakness or numbness in his leg. He denies any dizziness or lightheadedness. No chest pain or shortness of breath. No head, neck or back pain.  Patient is a 75 y.o. male presenting with fall. The history is provided by the patient and a relative.  Fall Pertinent negatives include no chest pain, no abdominal pain and no shortness of breath.    Past Medical History  Diagnosis Date  . UTI (urinary tract infection)   . Parkinson disease (HCC)   . Venous stasis     edema  . Obesity   . Hypothyroidism   . Morbid obesity (HCC) 07/14/2012   Past Surgical History  Procedure Laterality Date  . Cervical laminectomy    . Foot surgery  1992    right foot repair  . Appendectomy    . Anterior cervical discectomy c6-7     Family History  Problem Relation Age of Onset  . Heart disease Mother   . Hyperlipidemia Mother   . Colon cancer Father   . Alcohol abuse Other   . Cancer Sister     breast    Social History  Substance Use Topics  . Smoking status: Former Smoker -- 2 years    Types: Cigarettes    Quit date: 03/26/1992  . Smokeless tobacco: Never Used  . Alcohol Use: No     Review of Systems  Constitutional: Negative for fever, activity change and appetite change.  HENT: Negative for congestion and rhinorrhea.   Respiratory: Negative for cough, chest tightness and shortness of breath.   Cardiovascular: Negative for chest pain.  Gastrointestinal: Negative for nausea, vomiting and abdominal pain.  Genitourinary: Negative for dysuria and hematuria.  Musculoskeletal: Negative for myalgias, back pain and arthralgias.  Skin: Positive for wound.  Neurological: Negative for dizziness and weakness.  A complete 10 system review of systems was obtained and all systems are negative except as noted in the HPI and PMH.      Allergies  Review of patient's allergies indicates no known allergies.  Home Medications   Prior to Admission medications   Medication Sig Start Date End Date Taking? Authorizing Provider  calcium carbonate (TUMS - DOSED IN MG ELEMENTAL CALCIUM) 500 MG chewable tablet Chew 1-2 tablets by mouth 3 (three) times daily.    Historical Provider, MD  carbidopa-levodopa-entacapone (STALEVO) 25-100-200 MG tablet Takes 1 tablet 6 times a day 04/27/15   Historical Provider, MD  ciprofloxacin (CIPRO) 500 MG tablet Take 1 tablet (500 mg total) by mouth 2 (two) times daily. 05/08/15   Kristian CoveyBruce W Burchette, MD   BP 121/71 mmHg  Pulse 68  Temp(Src) 97.8 F (36.6  C) (Oral)  Resp 24  Ht 6\' 2"  (1.88 m)  Wt 265 lb (120.203 kg)  BMI 34.01 kg/m2  SpO2 96% Physical Exam  Constitutional: He is oriented to person, place, and time. He appears well-developed and well-nourished. No distress.  HENT:  Head: Normocephalic and atraumatic.  Mouth/Throat: Oropharynx is clear and moist. No oropharyngeal exudate.  Eyes: Conjunctivae and EOM are normal. Pupils are equal, round, and reactive to light.  Neck: Normal range of motion. Neck supple.  No meningismus.  Cardiovascular: Normal rate, regular rhythm, normal heart sounds and intact distal pulses.   No murmur  heard. Pulmonary/Chest: Effort normal and breath sounds normal. No respiratory distress.  Abdominal: Soft. There is no tenderness. There is no rebound and no guarding.  Musculoskeletal: Normal range of motion. He exhibits tenderness. He exhibits no edema.  Right lower lateral leg with 4 cm irregular laceration. Muscle belly visible. Intact ankle flexion and extension. Intact great toe extension. Intact DP and PT pulse. Intact distal sensation.  Abrasion of skin from tape  Neurological: He is alert and oriented to person, place, and time. No cranial nerve deficit. He exhibits normal muscle tone. Coordination normal.  No ataxia on finger to nose bilaterally. No pronator drift. 5/5 strength throughout. CN 2-12 intact.Equal grip strength. Sensation intact.   Skin: Skin is warm.  Psychiatric: He has a normal mood and affect. His behavior is normal.  Nursing note and vitals reviewed.       ED Course  .Marland KitchenLaceration Repair Date/Time: 07/01/2015 9:43 AM Performed by: Glynn Octave Authorized by: Glynn Octave Consent: Verbal consent obtained. Risks and benefits: risks, benefits and alternatives were discussed Consent given by: patient Patient understanding: patient states understanding of the procedure being performed Patient consent: the patient's understanding of the procedure matches consent given Patient identity confirmed: verbally with patient and provided demographic data Time out: Immediately prior to procedure a "time out" was called to verify the correct patient, procedure, equipment, support staff and site/side marked as required. Body area: lower extremity Location details: right lower leg Laceration length: 4 cm Tendon involvement: none Nerve involvement: none Vascular damage: no Anesthesia: local infiltration Local anesthetic: lidocaine 2% with epinephrine Anesthetic total: 12 ml Patient sedated: no Preparation: Patient was prepped and draped in the usual sterile  fashion. Irrigation solution: tap water Irrigation method: syringe Amount of cleaning: extensive Debridement: moderate Degree of undermining: minimal Skin closure: 3-0 Prolene Fascia closure: 3-0 Vicryl Number of sutures: 7 Technique: simple and vertical mattress Approximation: loose Approximation difficulty: complex Dressing: 4x4 sterile gauze and antibiotic ointment Patient tolerance: Patient tolerated the procedure well with no immediate complications Comments: 7 sutures, one vertical mattress, 3 3-0 Vicryl to close fascia   (including critical care time) Labs Review Labs Reviewed - No data to display  Imaging Review Dg Tibia/fibula Right  07/01/2015  CLINICAL DATA:  Fall on bedroom last night with injury to right lower leg. Laceration mid lower leg. EXAM: RIGHT TIBIA AND FIBULA - 2 VIEW COMPARISON:  None. FINDINGS: Osteoarthritic changes of the right knee most prominent over the medial compartment. There is no evidence of acute fracture or dislocation. Evidence of soft tissue laceration over the anterior mid aspect of the lower leg. Degenerative changes over the midfoot. IMPRESSION: No acute fracture.  Soft tissue laceration anterior mid lower leg. Electronically Signed   By: Elberta Fortis M.D.   On: 07/01/2015 09:12   I have personally reviewed and evaluated these images and lab results as part of my medical  decision-making.   EKG Interpretation None      MDM   Final diagnoses:  Leg laceration, right, initial encounter   Complex leg laceration after fall last night. Approximately 12 hours old.  Wound is deep and involves muscle but there is no appreciable weakness or numbness in the toes or foot. Tetanus is up-to-date. Discussed with patient and family member that there is risk of infection given the age of this wound. Will be irrigated thoroughly and attempts loose closure. Family in agreement.  Xray negative. Wound repaired as above. Augmentin started. Wound care  instructions given.  Family member is NP and will watch wound closely.  Return with spreading redness, fever, drainage or other signs of infection. Sutures out in 1 week.   Glynn Octave, MD 07/01/15 [phone]

## 2015-07-03 ENCOUNTER — Other Ambulatory Visit: Payer: Self-pay | Admitting: Neurology

## 2015-07-03 MED FILL — CARBIDOPA-LEVODOPA-ENTA 100: 25-100-200 | 30 days supply | Qty: 180 | Fill #0

## 2015-07-06 ENCOUNTER — Ambulatory Visit (INDEPENDENT_AMBULATORY_CARE_PROVIDER_SITE_OTHER): Payer: PPO | Admitting: Family Medicine

## 2015-07-06 ENCOUNTER — Encounter: Payer: Self-pay | Admitting: Family Medicine

## 2015-07-06 VITALS — BP 118/80 | HR 80 | Temp 97.3°F | Ht 74.0 in | Wt 276.0 lb

## 2015-07-06 DIAGNOSIS — S81811A Laceration without foreign body, right lower leg, initial encounter: Secondary | ICD-10-CM

## 2015-07-06 NOTE — Progress Notes (Signed)
Pre visit review using our clinic review tool, if applicable. No additional management support is needed unless otherwise documented below in the visit note. 

## 2015-07-06 NOTE — Patient Instructions (Signed)
Keep leg elevated frequently Keep would clean with soap and water Finish out antibiotic Follow up for any fever or increased redness or swelling.

## 2015-07-06 NOTE — Progress Notes (Signed)
   Subjective:    Patient ID: [name], male    DOB: 10/03/1940, 75 y.o.   MRN: [id number]  HPI Patient has Parkinson's disease. Last Saturday he sustained laceration to right lower leg. He states he was trying to get into bed and his leg apparently got caught on the walker. No loss of consciousness. No head injury. He was not seen in the ER until about 12 hours later. His tetanus is up-to-date. Laceration was repaired. Patient placed on Augmentin and has a few more days of that. He states his leg is less red and swollen compared to 2 days ago. No fevers or chills.  Past Medical History  Diagnosis Date  . UTI (urinary tract infection)   . Parkinson disease (HCC)   . Venous stasis     edema  . Obesity   . Hypothyroidism   . Morbid obesity (HCC) 07/14/2012   Past Surgical History  Procedure Laterality Date  . Cervical laminectomy    . Foot surgery  1992    right foot repair  . Appendectomy    . Anterior cervical discectomy c6-7      reports that he quit smoking about 23 years ago. His smoking use included Cigarettes. He quit after 2 years of use. He has never used smokeless tobacco. He reports that he does not drink alcohol or use illicit drugs. family history includes Alcohol abuse in his other; Cancer in his sister; Colon cancer in his father; Heart disease in his mother; Hyperlipidemia in his mother. No Known Allergies    Review of Systems  Constitutional: Negative for fever and chills.       Objective:   Physical Exam  Constitutional: He appears well-developed and well-nourished.  Cardiovascular: Normal rate and regular rhythm.   Pulmonary/Chest: Effort normal and breath sounds normal. No respiratory distress. He has no wheezes. He has no rales.  Skin:  Patient has laceration right anterior leg. He also has a very long vertical abrasion anterior leg. There some mild surrounding redness and mild edema. Minimal warmth. No drainage other than some minimal serous drainage  from the wound. Sutures intact          Assessment & Plan:  Complicated laceration right lower leg following recent fall. Finish out Augmentin. Elevate legs frequently. Follow-up in 4 days for suture removal. Keep clean with soap and water.  Patient may have had some cellulitis changes per hx but he states much improved today c/w 2 days ago.  Kristian CoveyBruce W Elvi Leventhal MD [facility]

## 2015-07-10 ENCOUNTER — Encounter: Payer: Self-pay | Admitting: Family Medicine

## 2015-07-10 ENCOUNTER — Ambulatory Visit (INDEPENDENT_AMBULATORY_CARE_PROVIDER_SITE_OTHER): Payer: PPO | Admitting: Family Medicine

## 2015-07-10 VITALS — BP 110/80 | HR 67

## 2015-07-10 DIAGNOSIS — S81811D Laceration without foreign body, right lower leg, subsequent encounter: Secondary | ICD-10-CM | POA: Diagnosis not present

## 2015-07-10 NOTE — Progress Notes (Signed)
Pre visit review using our clinic review tool, if applicable. No additional management support is needed unless otherwise documented below in the visit note. 

## 2015-07-10 NOTE — Patient Instructions (Signed)
Clean daily with soap and water Follow up for any increased redness, drainage, or warmth.

## 2015-07-10 NOTE — Progress Notes (Signed)
   Subjective:    Patient ID: [name], male    DOB: 12/23/1940, 75 y.o.   MRN: [id number]  HPI  Follow-up complex laceration right lower leg. He was placed on Cipro following his fall. Wound is healing well. He has not had any redness or drainage. No pain. No fevers or chills. No history of diabetes. Keeping clean with soap and water He reportedly had a couple of subcuticular sutures  Past Medical History  Diagnosis Date  . UTI (urinary tract infection)   . Parkinson disease (HCC)   . Venous stasis     edema  . Obesity   . Hypothyroidism   . Morbid obesity (HCC) 07/14/2012   Past Surgical History  Procedure Laterality Date  . Cervical laminectomy    . Foot surgery  1992    right foot repair  . Appendectomy    . Anterior cervical discectomy c6-7      reports that he quit smoking about 23 years ago. His smoking use included Cigarettes. He quit after 2 years of use. He has never used smokeless tobacco. He reports that he does not drink alcohol or use illicit drugs. family history includes Alcohol abuse in his other; Cancer in his sister; Colon cancer in his father; Heart disease in his mother; Hyperlipidemia in his mother. No Known Allergies   Review of Systems  Constitutional: Negative for fever and chills.       Objective:   Physical Exam  Constitutional: He appears well-developed and well-nourished.  Cardiovascular: Normal rate and regular rhythm.   Skin:  Right leg wound is examined. He has much less erythema and less swelling. No tenderness. No warmth. Sutures are removed without difficulty. He has some thick eschar over the central portion of the wound but overall appears to be healing very well          Assessment & Plan:  Healing laceration right leg. No signs of secondary infection. Sutures removed without difficulty. Keep clean with soap and water. Follow-up as needed  Kristian CoveyBruce W Burchette MD [facility]

## 2015-08-01 ENCOUNTER — Telehealth: Payer: Self-pay | Admitting: Neurology

## 2015-08-01 ENCOUNTER — Telehealth: Payer: Self-pay

## 2015-08-01 ENCOUNTER — Ambulatory Visit: Payer: Self-pay | Admitting: Neurology

## 2015-08-01 MED ORDER — CARBIDOPA-LEVODOPA-ENTACAPONE 25-100-200 MG PO TABS: 1.0000 | ORAL_TABLET | Freq: Every day | ORAL | Status: DC

## 2015-08-01 NOTE — Telephone Encounter (Signed)
Wife called and stated that she is unable to get patient here today. Per Dr. Frances FurbishAthar, ok to refill medication until next appt.

## 2015-08-01 NOTE — Telephone Encounter (Signed)
[name] wife called said they made every attempt to get to appt today. He had no energy, could not walk, could barely hold himself up. She said he is ok in the mornings but the afternoons he has no energy. She said they had hired someone to bring him to appt today and stay with him. The next appt has been scheduled 7/31. If you have any questions she can be reached on cell 1st and then [phone].e

## 2015-08-01 NOTE — Telephone Encounter (Signed)
I spoke to the patient, I confirmed where he needs refill sent to.

## 2015-08-02 ENCOUNTER — Encounter: Payer: Self-pay | Admitting: Neurology

## 2015-08-02 MED FILL — CARBIDOPA-LEVODOPA-ENTA 100: 25-100-200 | 30 days supply | Qty: 180 | Fill #0

## 2015-08-24 ENCOUNTER — Encounter (HOSPITAL_COMMUNITY): Payer: Self-pay

## 2015-08-24 ENCOUNTER — Emergency Department (HOSPITAL_COMMUNITY): Payer: PPO

## 2015-08-24 ENCOUNTER — Emergency Department (HOSPITAL_COMMUNITY)
Admission: EM | Admit: 2015-08-24 | Discharge: 2015-08-24 | Disposition: A | Discharge status: Home / Self Care | Payer: PPO | Attending: Emergency Medicine | Admitting: Emergency Medicine

## 2015-08-24 DIAGNOSIS — R531 Weakness: Secondary | ICD-10-CM | POA: Diagnosis not present

## 2015-08-24 DIAGNOSIS — Y929 Unspecified place or not applicable: Secondary | ICD-10-CM | POA: Insufficient documentation

## 2015-08-24 DIAGNOSIS — Z87891 Personal history of nicotine dependence: Secondary | ICD-10-CM | POA: Insufficient documentation

## 2015-08-24 DIAGNOSIS — W010XXA Fall on same level from slipping, tripping and stumbling without subsequent striking against object, initial encounter: Secondary | ICD-10-CM | POA: Insufficient documentation

## 2015-08-24 DIAGNOSIS — G20 Parkinson's disease: Secondary | ICD-10-CM | POA: Diagnosis not present

## 2015-08-24 DIAGNOSIS — T1490 Injury, unspecified: Secondary | ICD-10-CM | POA: Diagnosis not present

## 2015-08-24 DIAGNOSIS — R51 Headache: Secondary | ICD-10-CM | POA: Diagnosis not present

## 2015-08-24 DIAGNOSIS — Y999 Unspecified external cause status: Secondary | ICD-10-CM | POA: Diagnosis not present

## 2015-08-24 DIAGNOSIS — Y939 Activity, unspecified: Secondary | ICD-10-CM | POA: Insufficient documentation

## 2015-08-24 DIAGNOSIS — Z8669 Personal history of other diseases of the nervous system and sense organs: Secondary | ICD-10-CM

## 2015-08-24 DIAGNOSIS — R404 Transient alteration of awareness: Secondary | ICD-10-CM | POA: Diagnosis not present

## 2015-08-24 DIAGNOSIS — S0990XA Unspecified injury of head, initial encounter: Secondary | ICD-10-CM | POA: Diagnosis not present

## 2015-08-24 DIAGNOSIS — S299XXA Unspecified injury of thorax, initial encounter: Secondary | ICD-10-CM | POA: Diagnosis not present

## 2015-08-24 LAB — CBC WITH DIFFERENTIAL/PLATELET
BASOS PCT: 0 %
Basophils Absolute: 0 10*3/uL (ref 0.0–0.1)
EOS ABS: 0 10*3/uL (ref 0.0–0.7)
EOS PCT: 0 %
HCT: 40.5 % (ref 39.0–52.0)
HEMOGLOBIN: 13.5 g/dL (ref 13.0–17.0)
LYMPHS ABS: 1.2 10*3/uL (ref 0.7–4.0)
Lymphocytes Relative: 18 %
MCH: 29.4 pg (ref 26.0–34.0)
MCHC: 33.3 g/dL (ref 30.0–36.0)
MCV: 88.2 fL (ref 78.0–100.0)
MONO ABS: 0.7 10*3/uL (ref 0.1–1.0)
MONOS PCT: 10 %
Neutro Abs: 4.6 10*3/uL (ref 1.7–7.7)
Neutrophils Relative %: 71 %
PLATELETS: 167 10*3/uL (ref 150–400)
RBC: 4.59 MIL/uL (ref 4.22–5.81)
RDW: 13 % (ref 11.5–15.5)
WBC: 6.4 10*3/uL (ref 4.0–10.5)

## 2015-08-24 LAB — BASIC METABOLIC PANEL
Anion gap: 5 (ref 5–15)
BUN: 24 mg/dL — AB (ref 6–20)
CALCIUM: 8.6 mg/dL — AB (ref 8.9–10.3)
CHLORIDE: 106 mmol/L (ref 101–111)
CO2: 25 mmol/L (ref 22–32)
CREATININE: 0.96 mg/dL (ref 0.61–1.24)
GFR calc Af Amer: >60 mL/min (ref >60)
GFR calc non Af Amer: >60 mL/min (ref >60)
GLUCOSE: 100 mg/dL — AB (ref 65–99)
Potassium: 4.4 mmol/L (ref 3.5–5.1)
Sodium: 136 mmol/L (ref 135–145)

## 2015-08-24 LAB — URINALYSIS, ROUTINE W REFLEX MICROSCOPIC
GLUCOSE, UA: NEGATIVE mg/dL
HGB URINE DIPSTICK: NEGATIVE
KETONES UR: 15 mg/dL — AB
Nitrite: NEGATIVE
PH: 5.5 (ref 5.0–8.0)
PROTEIN: NEGATIVE mg/dL
Specific Gravity, Urine: 1.034 — ABNORMAL HIGH (ref 1.005–1.030)

## 2015-08-24 LAB — URINE MICROSCOPIC-ADD ON: Bacteria, UA: NONE SEEN

## 2015-08-24 LAB — I-STAT TROPONIN, ED: TROPONIN I, POC: 0 ng/mL (ref 0.00–0.08)

## 2015-08-24 NOTE — ED Provider Notes (Signed)
I received pt in signout from Dr. Judd Lienelo, who had  Evaluated him for weakness. His workup has been reassuring and Dr. Judd Lienelo suspected his sx were related to progression of Parkinson's. Had extensive discussions with social work, case management, and the patient's wife regarding placement options. Because it is a Friday afternoon, the patient will be unable to have placement directly from the ED and case management/social work will assist with placement from home. Wife is in agreement with this plan and patient will have PTAR transfer home. Pt discharged in satisfactory condition.   Laurence Spatesachel Morgan Little, MD 08/24/15 [phone]

## 2015-08-24 NOTE — Progress Notes (Addendum)
Pt. Was brought in for generalized weakness and a fall this AM. Patient lives at home with his wife who is a PA with [employer]. Patient has a caregiver that assists him with getting to OP therapy specifically for Parkinson's. Wife reports a steady decline in the past week with increased falls. A case management consult was initially placed for LTACH, however, after speaking with Patient's wife, she is requesting SNF placement. Wife is Patient's HCPOA. She notes that Patient will likely refuse SNF. CSW explained that unless Patient is deemed incompetent, we cannot make him go to a SNF if he does not want to go. CSW has contacted several [employer] Case Managers to request authorization if possible. Voice messages left. CSW awaiting return phone call. FL2 completed. PASRR submitted- under manual review due to Parkinson's diagnosis. Awaiting PT evaluation- consult has been placed. Covering ED Case Manager aware of case and is following in the event that insurance does not approve SNF authorization.   14:31 Patient agreeable to SNF if needed and insurance will approve it but WILL NOT go to [facility].   Lance MussAshley Gardner,MSW, LCSW [facility] ED/59M Clinical Social Worker [phone]

## 2015-08-24 NOTE — Evaluation (Signed)
Physical Therapy Evaluation Patient Details Name: [name] MRN: [id number] DOB: 10/20/1940 Today's Date: 08/24/2015   History of Present Illness  Pt. Was brought in for generalized weakness and a fall this AM. Patient lives at home with his wife who is a PA with [employer]. Patient has a caregiver that assists him with getting to OP therapy specifically for Parkinson's. Wife reports a steady decline in the past week with increased falls  Clinical Impression  Pt admitted with above diagnosis. Pt currently with functional limitations due to the deficits listed below (see PT Problem List). Pt with poor postural stability unable to sit EOB without total to min assist.  Pt sat EOB x  15 minutes only achieving balance with min assist with pillows propped under his right hip.  [name] wife states he has had steady decline in upper postural stabiity over last few months and has gotten to where he will lean on right side to eat with head almost resting on table.  Wife concerned.  Once pt sat needing two people for support,  he was open to going to a SNF.   Pt will benefit from skilled PT to increase their independence and safety with mobility to allow discharge to the venue listed below.      Follow Up Recommendations SNF;Supervision/Assistance - 24 hour    Equipment Recommendations  3in1 (PT);Other (comment) (TBA)    Recommendations for Other Services       Precautions / Restrictions Precautions Precautions: Fall Restrictions Weight Bearing Restrictions: No      Mobility  Bed Mobility Overal bed mobility: Needs Assistance;+2 for physical assistance Bed Mobility: Rolling;Sidelying to Sit Rolling: Mod assist;+2 for physical assistance Sidelying to sit: Total assist;+2 for physical assistance;HOB elevated       General bed mobility comments: Pt with heavy right lateral lean.  Initially could not sit upright.  Had to provide cues, lean on left elbow and prop under right hip with pillows and towel  roll to achieve midline.  Then pt with severe neck flexion with forward head. While pt sitting EOB, worked on midline posture for at least 15 minutes.  Pt needed initial total assist of 2 to sit up holding onto chair back in front of him but did progress to only needed min to mod assist with the pillows under his right hip.  Transfers                 General transfer comment: unable as pt could not sit up without assist.   Ambulation/Gait                Stairs            Wheelchair Mobility    Modified Rankin (Stroke Patients Only)       Balance Overall balance assessment: Needs assistance;History of Falls Sitting-balance support: Bilateral upper extremity supported;Feet supported Sitting balance-Leahy Scale: Zero Sitting balance - Comments: Pt needed total assist to min assist for brief periods of time.  Very poor sitting balance and postural stability in sitting.  Needs a lot of assist and cuing to sit upright.  Postural control: Right lateral lean     Standing balance comment: unable                              Pertinent Vitals/Pain Pain Assessment: No/denies pain  VSS    Home Living Family/patient expects to be discharged to:: Private residence Living Arrangements:  Spouse/significant other Available Help at Discharge: Family;Available PRN/intermittently (wife is a NP at [employer]) Type of Home: House Home Access: Level entry     Home Layout: One level Home Equipment: Walker - 2 wheels;Walker - 4 wheels;Shower seat      Prior Function Level of Independence: Independent with assistive device(s)         Comments: frequent falls that have worsened over last few months.      Hand Dominance        Extremity/Trunk Assessment   Upper Extremity Assessment: Defer to OT evaluation           Lower Extremity Assessment: RLE deficits/detail;LLE deficits/detail RLE Deficits / Details: grossly 3-/5 LLE Deficits / Details: grossly  3-/5  Cervical / Trunk Assessment: Other exceptions (Poor with heavy right lateral lean)  Communication   Communication: Other (comment) (talks quietly)  Cognition Arousal/Alertness: Awake/alert Behavior During Therapy: Flat affect Overall Cognitive Status: Impaired/Different from baseline Area of Impairment: Safety/judgement;Problem solving         Safety/Judgement: Decreased awareness of safety;Decreased awareness of deficits   Problem Solving: Difficulty sequencing;Requires verbal cues;Requires tactile cues      General Comments      Exercises General Exercises - Lower Extremity Long Arc Quad: AROM;Both;10 reps;Seated      Assessment/Plan    PT Assessment Patient needs continued PT services  PT Diagnosis Generalized weakness;Difficulty walking   PT Problem List Decreased activity tolerance;Decreased balance;Decreased safety awareness;Decreased knowledge of use of DME;Decreased knowledge of precautions;Pain;Decreased strength;Decreased mobility  PT Treatment Interventions DME instruction;Gait training;Functional mobility training;Therapeutic activities;Therapeutic exercise;Balance training;Patient/family education   PT Goals (Current goals can be found in the Care Plan section) Acute Rehab PT Goals Patient Stated Goal: to get better PT Goal Formulation: With patient/family Time For Goal Achievement: 09/07/15 Potential to Achieve Goals: Good    Frequency Min 3X/week   Barriers to discharge Decreased caregiver support (wife works)      Merchandiser, retailCo-evaluation               End of Session Equipment Utilized During Treatment: Gait belt Activity Tolerance: Patient limited by fatigue Patient left: in bed;with call bell/phone within reach;with family/visitor present Nurse Communication: Mobility status;Need for lift equipment    Functional Assessment Tool Used: clinical judgment Functional Limitation: Changing and maintaining body position Changing and Maintaining Body  Position Current Status [phone]): At least 80 percent but less than 100 percent impaired, limited or restricted Changing and Maintaining Body Position Goal Status (U0454(G8982): At least 20 percent but less than 40 percent impaired, limited or restricted    Time: 1424-1455 PT Time Calculation (min) (ACUTE ONLY): 31 min   Charges:   PT Evaluation $PT Eval Moderate Complexity: 1 Procedure PT Treatments $Therapeutic Activity: 8-22 mins   PT G Codes:   PT G-Codes **NOT FOR INPATIENT CLASS** Functional Assessment Tool Used: clinical judgment Functional Limitation: Changing and maintaining body position Changing and Maintaining Body Position Current Status (U9811(G8981): At least 80 percent but less than 100 percent impaired, limited or restricted Changing and Maintaining Body Position Goal Status (B1478(G8982): At least 20 percent but less than 40 percent impaired, limited or restricted    Berline LopesWhite, Merial Moritz F 08/24/2015, 3:15 PM  Wells Mabe,PT Acute Rehabilitation [phone] (pager)

## 2015-08-24 NOTE — ED Notes (Signed)
Called PT stated patient is on their list and will evaluate patient in the ED.

## 2015-08-24 NOTE — ED Notes (Signed)
PT at bedside.

## 2015-08-24 NOTE — Progress Notes (Signed)
Spoke with ED SW, Jody about pt - Pending psarr

## 2015-08-24 NOTE — ED Notes (Signed)
Given patient a Malaw[place]urkey sandwich and apple sauce.

## 2015-08-24 NOTE — ED Notes (Signed)
Pt. BIB [facility] for evaluation of generalized weakness and fall this AM. Pt. Lives at home with wife who is an NP. States pt. Has been slightly altered. Pt. Reports sliding down wall on way back from bathroom, wife reports finding pt. Naked in middle of the floor this AM. Pt. Denies pain at this time. Pt. Alert and oriented to person and place. Hx of parkinsons, states has had productive cough.

## 2015-08-24 NOTE — Progress Notes (Signed)
Spoke with ED SW No pasrr received

## 2015-08-24 NOTE — Progress Notes (Signed)
[facility] called to update this ED CM about interventions completed and pending d/c plans

## 2015-08-24 NOTE — NC FL2 (Signed)
  [place] [place] MEDICA[place] FL2 LEVEL OF CARE SCREENING TOOL     IDENTIFICATION  Patient Name: [name] Birthdate: 05/29/1940 Sex: male Admission Date (Current Location): 08/24/2015  [facility] [place]   Facility and Address:  [facility] [place], [address], [place], [place] [address]      Provider Number: [id number]  Attending Physician Name and Address:  Geoffery Lyonsouglas Delo, MD  Relative Name and Phone Number:  Alonna BucklerRita Josey (spouse) [phone]    Current Level of Care: Hospital Recommended Level of Care: Skilled Nursing Facility Prior Approval Number:    Date Approved/Denied:   PASRR Number:    Discharge Plan: SNF    Current Diagnoses: Patient Active Problem List   Diagnosis Date Noted  . Recurrent falls 08/10/2014  . Falls 08/10/2014  . Weakness   . Parkinson disease (HCC)   . Morbid obesity (HCC) 07/14/2012  . WEAKNESS 01/30/2010  . URINARY INCONTINENCE 01/30/2010  . DYSURIA 11/13/2009  . PARKINSON'S DISEASE 01/29/2009    Orientation RESPIRATION BLADDER Height & Weight     Self, Time, Situation, Place  Normal Incontinent Weight:   Height:     BEHAVIORAL SYMPTOMS/MOOD NEUROLOGICAL BOWEL NUTRITION STATUS   (NONE)  (NONE) Continent Diet (Heart Healthy Diet)  AMBULATORY STATUS COMMUNICATION OF NEEDS Skin   Limited Assist Verbally Normal                       Personal Care Assistance Level of Assistance  Bathing, Feeding Bathing Assistance: Limited assistance Feeding assistance: Independent       Functional Limitations Info  Sight, Hearing, Speech Sight Info: Adequate Hearing Info: Adequate Speech Info: Adequate    SPECIAL CARE FACTORS FREQUENCY  PT (By licensed PT), OT (By licensed OT)                    Contractures Contractures Info: Not present    Additional Factors Info  Code Status, Allergies, Psychotropic Code Status Info: FULL Allergies Info: No Known Allergies           Current  Medications (08/24/2015):  This is the current hospital active medication list No current facility-administered medications for this encounter.   Current Outpatient Prescriptions  Medication Sig Dispense Refill  . calcium carbonate (TUMS - DOSED IN MG ELEMENTAL CALCIUM) 500 MG chewable tablet Chew 1-2 tablets by mouth 3 (three) times daily as needed for indigestion.     . carbidopa-levodopa-entacapone (STALEVO) 25-100-200 MG tablet Take 1 tablet by mouth 6 (six) times daily. 180 tablet 1     Discharge Medications: Please see discharge summary for a list of discharge medications.  Relevant Imaging Results:  Relevant Lab Results:   Additional Information SS # [id number]  Rockwell GermanyAshley N Gardner, LCSW

## 2015-08-24 NOTE — Care Management Note (Signed)
Case Management Note  Patient Details  Name: [name] MRN: [id number] Date of Birth: 10/17/1940  Subjective/Objective:   Patient in the ER.  Parkinson disease advancing.  Lives at home with his wife.  Have a caregiver that assists him getting to OP Therapy specifically for Parkinson patients three times a week in the morning.  They also assist him with getting lunch.  Before this week he has been functioning at home alone along with this assistance.  However, wife has noted a steady decline, with increased falls and feels he needs more intense rehab at this time.  Would like to see if could go to a SNF rehab for this.  SW consulted.  Briefly discussed other options, if insurance would not approve this from the ER.  Wife is under the understanding that plan is to discharge from the ER.  Discussed briefly, possibility of going home with wife and having assistance at home.  Wife states plans to come down and meet with us.  Will [facility] and private pay assistance in case unable to get approval from Insurance for rehab.                  Action/Plan:   Expected Discharge Date:                  Expected Discharge Plan:  Skilled Nursing Facility  In-House Referral:  Clinical Social Work  Discharge planning Services  CM Consult  Post Acute Care Choice:    Choice offered to:     DME Arranged:    DME Agency:     HH Arranged:    HH Agency:     Status of Service:  In process, will continue to follow  If discussed at Long Length of Stay Meetings, dates discussed:    Additional Comments:  Vangie BickerBrown, Casson Catena Jane, RN 08/24/2015, 1:29 PM

## 2015-08-24 NOTE — Progress Notes (Signed)
ED CM called by EDP [name] at 1658 to inquire about the disposition of pt Provided update that no pasrr at this time States she is with the wife and she is agreeable to d/c pt home with home health services  Dr [name] provided the wife with the phone ED Cm spoke with Eric [place] [name], wife about Cm consult for home health services and pending pasrr needed prior to pt going to any facility.  She voiced understanding Wife refused home health She states they had home health before and it was "useless"  [name] is stating the preference is for the pt to be placed in inpatient rehab. Cm discussed with her that the pt is at an outpatient level of care and can not be placed in [facility] from ED/outpatient level per guidelines.  Voiced understanding.  She states she is referring to an inpatient rehab at a facility in particular "at [facility]" States she has "first Choice out three times a week for three hours already" and "home health would not help us"  [name] states "PT recommends inpatient rehab"  CM inquired who what PT evaluated the pt and [place] [name] informed CM " [facility]"  [name] states she prefers the PT recommendations for inpatient rehab vs home health Cm explained to her that she could get all the services presently receiving in the ED via home health [facility], [facility] aide, HHPT, HHOT, and HHSW.  CM reviewed in details Choices of home health [facility](HH) (length of stay in home, [facility] available, coverage, primary caregiver, up to 24 hrs before services may be started) and choices of Private duty nursing (PDN-coverage, length of stay in the home types of staff available). CM reviewed availability of HH SW to assist pcp to get pt to snf (if desired disposition) from the community level.  Again [name] refused home health She informed CM she was a NP at [name]" She continued to state the pt needed to go to [facility] inpatient rehab Cm discussed that CM unable to  assist with that process and would consult ED SW to assist 1715 ED SW updated Encouraged her to speak with EDP prior to pt and wife  1721 Cm Cm received a call from Dr [name] EDP with ED SW present to state she had just spoken with wife who would be taking pt home and they were setting up PTAR at this time and did want home health CM informed EDP to enter the [name], aide and SW and CM would again call the wife to offer home health but Cm had been informed wife did not want home health 1727 Cm called wife to speak with her about home health services Informed her Cm received a call from EDP and was informed pt would be d/c Wife confirmed pt would be d/c via PTAR to home 08/24/15 Cm asked if Cm could assist with home health Again [name] informed Cm she did not want home health "he does not need them.  [facility] but it was not helpful"  Wife again states they have Home instead services Cm discussed with her that home instead is a personal care service not home health  [name] confirms she is aware "they are sitters"  Voiced concern about getting time off work CM recommended she at lease allow HHSW and HHPT or RN to assist with continuing the placement process  Discussed in order to get HHSW the guidelines recommends HHPT or HHRN after she stated she did not want a RN or PT.  Cm finally got [facility] Discussed that once pt is d/c ED SW is not obligated to continue she search Wife inquired if pt would be in a facility "by Monday" and Cm informed her CM would not be able to provide the answer, referred her to SW and facility. Wife states her "first choice would be [facility] and then [facility]" Cm discussed with her how the bed search process is completed and that there is not a guarantee that the first choice may have available bed to meet pt needs but the choices would be acknowledged [name] voiced understanding  1740 ED CM updated EDP and EDSW on wife still  initially not agreeing to home health but finally agreed to Cm recommendations of HHRN/SW Discussed wife inquired about SW to calling her and about when pt would get a bed  1805 faxed orders, F2F, EDP notes x 2, face sheet and PT notes to [facility] [phone] with fax confirmation received

## 2015-08-24 NOTE — Discharge Instructions (Signed)

## 2015-08-24 NOTE — ED Notes (Signed)
Doctor at bedside.

## 2015-08-24 NOTE — ED Provider Notes (Signed)
CSN: [id number]     Arrival date & time 08/24/15  0800 History   First MD Initiated Contact with Patient 08/24/15 [phone]     Chief Complaint  Patient presents with  . Weakness     (Consider location/radiation/quality/duration/timing/severity/associated sxs/prior Treatment) HPI Comments: Patient is a 75 year old male with past medical history of Parkinson's disease. He presents for evaluation of weakness and fall. He apparently lost his balance and fell on the floor this morning. He denies any injury. It sounds as though his wife had a difficult time getting him off the floor and called EMS for transport. The patient does report cough over the past several days which is been worsening. His wife is concerned he may have pneumonia. He denies any fevers, chills, or chest pain. He denies any head trauma, headache, neck pain.  Patient is a 75 y.o. male presenting with weakness. The history is provided by the patient.  Weakness This is a chronic problem. The problem occurs constantly. The problem has been gradually worsening. Nothing aggravates the symptoms. Nothing relieves the symptoms.    Past Medical History  Diagnosis Date  . UTI (urinary tract infection)   . Parkinson disease (HCC)   . Venous stasis     edema  . Obesity   . Hypothyroidism   . Morbid obesity (HCC) 07/14/2012   Past Surgical History  Procedure Laterality Date  . Cervical laminectomy    . Foot surgery  1992    right foot repair  . Appendectomy    . Anterior cervical discectomy c6-7     Family History  Problem Relation Age of Onset  . Heart disease Mother   . Hyperlipidemia Mother   . Colon cancer Father   . Alcohol abuse Other   . Cancer Sister     breast    Social History  Substance Use Topics  . Smoking status: Former Smoker -- 2 years    Types: Cigarettes    Quit date: 03/26/1992  . Smokeless tobacco: Never Used  . Alcohol Use: No    Review of Systems  Neurological: Positive for weakness.  All other  systems reviewed and are negative.     Allergies  Review of patient's allergies indicates no known allergies.  Home Medications   Prior to Admission medications   Medication Sig Start Date End Date Taking? Authorizing Provider  calcium carbonate (TUMS - DOSED IN MG ELEMENTAL CALCIUM) 500 MG chewable tablet Chew 1-2 tablets by mouth 3 (three) times daily.    Historical Provider, MD  carbidopa-levodopa-entacapone (STALEVO) 25-100-200 MG tablet Take 1 tablet by mouth 6 (six) times daily. 08/01/15   Huston FoleySaima Athar, MD   BP 115/71 mmHg  Pulse 64  Temp(Src) 98.6 F (37 C) (Oral)  Resp 24  SpO2 97% Physical Exam  Constitutional: He is oriented to person, place, and time. He appears well-developed and well-nourished. No distress.  HENT:  Head: Normocephalic and atraumatic.  Mouth/Throat: Oropharynx is clear and moist.  Eyes: EOM are normal. Pupils are equal, round, and reactive to light.  Neck: Normal range of motion. Neck supple.  There is no cervical spine tenderness. He has painless range of motion in all directions.  Cardiovascular: Normal rate and regular rhythm.  Exam reveals no friction rub.   No murmur heard. Pulmonary/Chest: Effort normal and breath sounds normal. No respiratory distress. He has no wheezes. He has no rales.  Abdominal: Soft. Bowel sounds are normal. He exhibits no distension. There is no tenderness.  Musculoskeletal: Normal range  of motion. He exhibits no edema.  Neurological: He is alert and oriented to person, place, and time. No cranial nerve deficit. Coordination abnormal.  He does have a tremor noted. His speech is somewhat slurred which is apparently his baseline.  Skin: Skin is warm and dry. He is not diaphoretic.  Nursing note and vitals reviewed.   ED Course  Procedures (including critical care time) Labs Review Labs Reviewed  BASIC METABOLIC PANEL  CBC WITH DIFFERENTIAL/PLATELET    Imaging Review No results found. I have personally reviewed  and evaluated these images and lab results as part of my medical decision-making.   EKG Interpretation None      MDM   Final diagnoses:  None    Patient is a 75 year old male with history of Parkinson's disease diagnosed newly 20 years ago. He presents today with decreased functional ability over the past several months. He apparently became weak today and was unable to walk. Was brought here for evaluation after he early fell on the floor. Patient is denying any injury and I see no obvious trauma.  His laboratory studies are essentially unremarkable, urinalysis is clear, chest x-ray and head CT are negative. I have found no reason for his progressive decline other than end-stage Parkinson's disease. I spoke with Dr. Roxy Mannsster from neurology. Patient was evaluated by the neurology mid level who is familiar with this patient and the patient's wife. There is no indication for admission, however the wife does not feel as though the patient is strong enough to return home. We have involved case management who will attempt to place the patient in skilled nursing/rehabilitation. This search is ongoing at the time of this dictation.  Care will be signed out to the oncoming provider, Dr. Clarene DukeLittle at shift change awaiting placement.    Geoffery Lyonsouglas Victorine Mcnee, MD 08/24/15 [phone]

## 2015-09-03 MED FILL — CARBIDOPA-LEVODOPA-ENTA 100: 25-100-200 | 30 days supply | Qty: 180 | Fill #1

## 2015-09-17 ENCOUNTER — Encounter: Payer: Self-pay | Admitting: Neurology

## 2015-09-17 ENCOUNTER — Ambulatory Visit (INDEPENDENT_AMBULATORY_CARE_PROVIDER_SITE_OTHER): Payer: PPO | Admitting: Neurology

## 2015-09-17 VITALS — BP 104/74 | HR 70 | Resp 18 | Ht 74.0 in | Wt 266.0 lb

## 2015-09-17 DIAGNOSIS — G20 Parkinson's disease: Secondary | ICD-10-CM

## 2015-09-17 DIAGNOSIS — R296 Repeated falls: Secondary | ICD-10-CM | POA: Diagnosis not present

## 2015-09-17 DIAGNOSIS — E669 Obesity, unspecified: Secondary | ICD-10-CM | POA: Diagnosis not present

## 2015-09-17 MED ORDER — CARBIDOPA-LEVODOPA-ENTACAPONE 25-100-200 MG PO TABS: 1.0000 | ORAL_TABLET | Freq: Every day | ORAL | 3 refills | Status: DC

## 2015-09-17 NOTE — Patient Instructions (Addendum)
I will keep your medication the same for your Parkinson's. I would like to suggest, that if any way possible, you would benefit from having an aid/care taker come in every day, Mon-Friday for 3-4 hours to help with dressing bathing, exercise, etc.  Use your walker at all times. Try to exercise regularly. Try to lose weight, good job with losing about 10 pounds thus far! Try to drink enough water, about 6-8 glasses, if possible.  When sitting, try to elevate your legs.

## 2015-09-17 NOTE — Progress Notes (Signed)
Subjective:    Patient ID: ALECXANDER MAINWARING is a 75 y.o. male.  HPI     Interim history:   Mr. Danese is a very pleasant 75 year old left-handed gentleman with an underlying medical history of degenerative disc disease, alcoholism in the past, history of syncope, obesity, remote history of pneumonia, status post neck surgery at C6-7, who presents for followup consultation of his advanced right-sided predominant Parkinson's disease, complicated by dyskinesias, recurrent falls, and prior medication intolerances, Dx dating back to 06/1994. He is unaccompanied today, caretaker brought him to the appointment. Of note, he missed an appointment on 08/01/2015. I last saw him on 12/04/2014, at which time he reported doing reasonably well, no falls since June 2016, was using his walker more consistently, was trying to drink enough water. His wife was working full-time as a Designer, jewellery. He had a male aide come in twice a week for about 6 hours to help him with his laundry, showering and other chores. I suggested he continue with his current medication regimen. He no showed for appointment in February 2017.  Today, 09/17/2015: He reports issues with balance and tendency to fall. He feels worse in the afternoon around 5-6 PM, feeling weak and fatigued. [facility], wife takes him. He take Stalevo 100 mg strength 1 pill 6 times a day, approx 4 hours apart, starting around 6 am.  Sadly, his younger sister next to him died yesterday from cancer. He has 2 other younger sisters. He was able to see her and hold her hand till the end. He is visible sad and shaken as he talks about it, otherwise, has been able to hold up well, considering. He has a care taker 3 days a week for 3 hours usually.  Of note, he presented to the emergency room on 03/01/2015 after a fall. In the interim, he had a fall and sustained a laceration to his right distal leg, requiring stitches. He went to the emergency room on  07/01/2015 and I reviewed the records. Also, he presented to the emergency room on 08/24/2015 with a complaint of increased weakness after a fall and inability to walk. I reviewed the emergency room records. He had a head CT without contrast on 08/24/2015, which I reviewed: IMPRESSION: Mild atrophy with patchy periventricular small vessel disease, stable. No intracranial mass, hemorrhage, or extra-axial fluid collection. No acute infarct evident. There are foci of arterial vascular calcification. There is leftward deviation of the nasal septum. There is probable cerumen in each external auditory canal.  Previously:  I saw him on 05/22/2014, at which time he reported no recent falls. He was using his cane usually after 5 PM when he would become more dyskinetic. He did admit to avoid driving at [place] and [place]. He felt that symptoms were stable. He was trying to go to the gym regularly for exercise. In the interim, he presented to the emergency room and was hospitalized for a day on 08/10/2014 2 08/11/2014 secondary to left leg weakness and recurrent falls and progressive decline. He had workup with a head CT and MRI which I reviewed: CT head without contrast on 08/10/2014 showed:  No acute intracranial pathology. Mild age-related atrophy and chronic microvascular ischemic disease. If symptoms persist and there are no contraindications, MRI may provide better evaluation if clinically indicated.    MRI brain without contrast on 08/10/2014 showed:  1. No acute intracranial abnormality. Mild for age nonspecific cerebral white matter signal changes.  2. Chronic cervical spinal stenosis, that seen at C3-C4 appears grossly stable since 2011. In addition, I personally reviewed the images through the PACS system. I agree with the findings.  Left hip x-ray on 08/10/2014 was negative. Left hip MRI on 08/10/2014 showed: No acute abnormality.  Negative for fracture.   Small amount of fluid in the  left trochanteric bursa compatible with bursitis.   I saw him on 10/26/2013, at which time his wife reported a fall in July. She was supposed to start a new job and was worried about leaving him alone during the day and requested a letter from me on behalf of his [name], [name], to help him transfer through [place] in [place], [name] to [place], [name], so he could stay with them. I kept him on Stalevo. I restarted him on Azilect as an adjunct. Of note, he canceled an appointment with me on 03/07/2014.    I saw him on 01/11/13, at which time I counseled him regarding staying within the prescribed amount of Stalevo. I kept him on Stalevo 6 times a day. I suggested adding Azilect. We talked about his prior diagnosis of obstructive sleep apnea in 2008 and I suggested he be retested for sleep apnea and ordered a sleep study. However, he did not come back for the sleep study, as he did not feel like he wanted to do it. Of note, he no showed for an appointment on 07/12/2013, as he did not have a ride.    He fell on 09/02/13 and hit his head and went to the ER, where he had [facility], which I reviewed: 1. No acute intracranial abnormality. 2. Stable moderate generalized atrophy and mild to moderate chronic microvascular ischemic changes of the white matter. He stopped the Azilect, as he did not feel better.      I first met him on 07/14/2012, at which time I felt he was stable and it did not make any medication changes. He requested a letter to excuse him from jury duty which I provided. In the interim, I was notified that he was trying to fill his Stalevo prescription earlier than scheduled and perhaps overusing Stalevo. The pharmacy note from [facility] stated that he presented there demanding a refill and threatening to go to the emergency room if he would not get his prescription.    He previously followed with Dr. Morene Antu and was last seen by him on 02/25/2012, at which time Dr. Erling Cruz felt that the  patient was doing well. He was exercising regularly.   He was diagnosed with right-sided predominant Parkinson's disease in May 1996 by Dr. Morrell Riddle. He was on selegiline 5 mg in the beginning and in 1999 he was started on amantadine. He discontinued his these medications and started Mirapex but had side effects and discontinued it after a month. He started seeing Dr. Erling Cruz in June 2000 and was re-started on selegiline and amantadine. He was then started on Permax. This was changed and 2005 to Stalevo and Requip was added but discontinued because of excessive daytime somnolence. He has a history of obesity and lost 60 pounds with an exercise program. He has had some memory loss. He had neck surgery in 1996. A sleep study in April 2008 showed hypopneas. In January of this year his MMSE was 29, clock drawing was 4, animal fluency was 13.   He goes to [facility] 3 times a week on M/W/F. He lives with his wife in his own 2 storey home.  His bedroom is downstairs. He snores and therefore, sleeps in a separate bedroom. His wife is in school to become a NP. He has fallen in the past.   I reviewed his sleep study from 05/27/06: AHI was 10.1, based on 62 hypopneas and his REM AHI 27.1/h, his O2 nadir was 78% and he spent 17.6 minutes below the saturation of 88%. It was recommended that he come back for CPAP titration but that never came to fruition for one reason or another. He stated that he was told he did not have to be treated with CPAP.   His Past Medical History Is Significant For: Past Medical History:  Diagnosis Date  . Hypothyroidism   . Morbid obesity ([place]) 07/14/2012  . Obesity   . Parkinson disease ([place])   . UTI (urinary tract infection)   . Venous stasis    edema    His Past Surgical History Is Significant For: Past Surgical History:  Procedure Laterality Date  . anterior cervical discectomy C6-7    . APPENDECTOMY    . CERVICAL LAMINECTOMY    . FOOT SURGERY  1992   right foot repair    His Family  History Is Significant For: Family History  Problem Relation Age of Onset  . Heart disease Mother   . Hyperlipidemia Mother   . Colon cancer Father   . Alcohol abuse Other   . Cancer Sister     breast     His Social History Is Significant For: Social History   Social History  . Marital status: Married    Spouse name: Velva Harman  . Number of children: 3  . Years of education: college   Occupational History  .  Retired   Social History Main Topics  . Smoking status: Former Smoker    Years: 2.00    Types: Cigarettes    Quit date: 03/26/1992  . Smokeless tobacco: Never Used  . Alcohol use No  . Drug use: No  . Sexual activity: Not Asked   Other Topics Concern  . None   Social History Narrative   Regular exercise-No   Retired,is left handed and resides with wife    His Allergies Are:  No Known Allergies:   His Current Medications Are:  Outpatient Encounter Prescriptions as of 09/17/2015  Medication Sig  . calcium carbonate (TUMS - DOSED IN MG ELEMENTAL CALCIUM) 500 MG chewable tablet Chew 1-2 tablets by mouth 3 (three) times daily as needed for indigestion.   . carbidopa-levodopa-entacapone (STALEVO) 25-100-200 MG tablet Take 1 tablet by mouth 6 (six) times daily.  . [DISCONTINUED] carbidopa-levodopa-entacapone (STALEVO) 25-100-200 MG tablet Take 1 tablet by mouth 6 (six) times daily.   No facility-administered encounter medications on file as of 09/17/2015.   :  Review of Systems:  Out of a complete 14 point review of systems, all are reviewed and negative with the exception of these symptoms as listed below: Review of Systems  Neurological:       No new concerns per patient. He states that he takes Stalevo 6 times a day. He is able to walk better in the morning.     Objective:  Neurologic Exam  Physical Exam Physical Examination:   Vitals:   09/17/15 0924  BP: 104/74  Pulse: 70  Resp: 18    General Examination: The patient is a very pleasant 75 y.o. male  in no acute distress. He is Obese, has been able to lose about 10 pounds in the past year or so.  HEENT: Normocephalic, atraumatic, pupils are equal, round and reactive to light and accommodation. Extraocular tracking shows mild saccadic breakdown without nystagmus noted. There is limitation to upper gaze. There is mild decrease in eye blink rate. Hearing is intact. Face is symmetric with moderate facial masking and mouth is open most of the time with normal facial sensation and mild/head/neck facial dyskinesias. There is no lip, neck or jaw tremor. Neck is moderately rigid with intact passive ROM. There are no carotid bruits on auscultation. Oropharynx exam reveals no significant mouth dryness. No significant airway crowding is noted. Mallampati is class II. Tongue protrudes centrally and palate elevates symmetrically. There is minimal drooling/moisture around angles of mouth.   Chest: is clear to auscultation without wheezing, rhonchi or crackles noted.  Heart: sounds are regular and normal without murmurs, rubs or gallops noted.   Abdomen: is soft, non-tender and non-distended with normal bowel sounds appreciated on auscultation.  Extremities: There is trace pitting edema in the distal lower extremities bilaterally, better. Chronic stasis-like changes are noted in the distal legs bilaterally. There are no varicose veins, but spider veins are present and unchanged.  Skin: is warm and dry with no trophic changes noted. Age-related changes are noted on the skin. Also dry patches.  Musculoskeletal: exam reveals no obvious joint deformities, tenderness, joint swelling or erythema.  Neurologically:   Mental status: The patient is awake and alert, paying good  attention. He is able to completely provide the history. He is oriented to: person, place, situation, day of week, month of year and year. His memory, attention, language and knowledge are fairly well intact. There is no aphasia, agnosia,  apraxia or anomia. There is a no significant degree of bradyphrenia. Speech is moderately hypophonic with mild dysarthria noted. Mood is congruent and affect is normal.   Cranial nerves are as described above under HEENT exam. In addition, shoulder shrug is normal with equal shoulder height noted.  Motor exam: Normal bulk, and strength for age is noted. There are mild generalized dyskinesias noted. Tone is mildly rigid with presence of cogwheeling in the bilateral upper extremities. There is overall moderate bradykinesia. There is no drift or rebound.  There is no tremor. Romberg is not tested today, as he has trouble standing narrow based.   Reflexes are trace in the upper extremities and absent in the lower extremities.   Fine motor skills exam:  findings are moderate to severely impaired on the right and moderately impaired on the left.  Cerebellar testing shows no dysmetria or intention tremor on finger to nose testing. There is no truncal or gait ataxia.   Sensory exam is intact to light touch in the upper and lower extremities.   Gait, station and balance: He stands up from the seated position with moderate difficulty and requires 2 attempts, has a tendency to fall backwards when first standing up, he stands wide-based, posture is moderately stooped. He is walking with his 4 wheeled walker and I did not think it was safe to let him walk without his walker. Tandem walk is not possible. Balance is moderately impaired.      Assessment and Plan:   In summary, HERMANN DOTTAVIO is a very pleasant 75 year old male with a history of advanced Parkinson's disease, right-sided predominant, complicated by obesity, dyskinesias, medication intolerances in the past and progressive difficulty with gait and balance and recurrent falls. He has been tried on amantadine, Permax, Mirapex, selegiline, and Requip in the past. He has been reasonably stable  on the current regimen. Unfortunately, he  has taken several  falls, thankfully without sustaining any serious injuries, nevertheless, he is at risk for falls and we talked about fall safety quite a bit today. I'm glad to hear that he has a caretaker come in for a few hours 3 days a week but would like to encourage the patient and his wife to see about getting more help during the day, perhaps an aid/caretaker for about 4 hours Monday through Friday. His wife works as a Designer, jewellery, and therefore has thankfully a lot of medical knowledge.I asked him to use his walker at all times. I asked him to first and for a few seconds, get his bearings and then start walking with his walker. I suggested we continue with his Stalevo at 1 pill 6 times a day, 100 mg strength. I renewed his prescription for 90 days at a time with 3 refills. I asked him to stay well-hydrated and continue to work on weight loss. I suggested a 6 month follow-up, sooner as needed. I answered all his questions today and the patient was in agreement.  I spent 25 minutes in total face-to-face time with the patient, more than 50% of which was spent in counseling and coordination of care, reviewing test results, reviewing medication and discussing or reviewing the diagnosis of PD, its prognosis and treatment options.

## 2015-09-25 ENCOUNTER — Telehealth: Payer: Self-pay | Admitting: Neurology

## 2015-09-25 DIAGNOSIS — G20 Parkinson's disease: Secondary | ICD-10-CM

## 2015-09-25 DIAGNOSIS — R419 Unspecified symptoms and signs involving cognitive functions and awareness: Secondary | ICD-10-CM

## 2015-09-25 NOTE — Telephone Encounter (Signed)
Wife [name] called, wants to know what Dr. 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 Also wants to know if Aricept would interact with Stalevo.

## 2015-09-25 NOTE — Telephone Encounter (Signed)
I called [name] and she is aware of recommendations below. She feels that his overall memory is ok but has trouble making "execuative decisions" or things like remembering to use his cane to walk. She states that she is trying to be proactive and hoping something like Aricept can help preserve his cognitive functioning. [name] is aware of side effects and voices understanding. She would like to proceed with neuropsychiatric testing.

## 2015-09-25 NOTE — Telephone Encounter (Signed)
I would like to send patient for memory evaluation first. We can make a referral to neuropsych and then decide on memory medication. My biggest concern is fall risk and Aricept can increase issues with balance, cause mouth dryness and rarely confusion and hallucinations and GI related SEs. Please call wife back to explain. We can initiate neuropsych referral if okay.

## 2015-10-09 MED FILL — CARBIDOPA-LEVODOPA-ENTA 100: 25-100-200 | 90 days supply | Qty: 540 | Fill #0

## 2016-01-08 MED FILL — CARBIDOPA-LEVODOPA-ENTA 100: 25-100-200 | 30 days supply | Qty: 180 | Fill #1

## 2016-02-04 MED FILL — CARBIDOPA-LEVODOPA-ENTA 100: 25-100-200 | 30 days supply | Qty: 180 | Fill #2

## 2016-03-05 MED FILL — CARBIDOPA-LEVODOPA-ENTA 100: 25-100-200 | 30 days supply | Qty: 180 | Fill #3

## 2016-03-19 ENCOUNTER — Ambulatory Visit: Payer: Self-pay | Admitting: Neurology

## 2016-03-31 MED FILL — CARBIDOPA-LEVODOPA-ENTA 100: 25-100-200 | 30 days supply | Qty: 180 | Fill #4

## 2016-04-08 ENCOUNTER — Telehealth: Payer: Self-pay | Admitting: Family Medicine

## 2016-04-08 MED ORDER — OSELTAMIVIR PHOSPHATE 75 MG PO CAPS: 75.0000 mg | ORAL_CAPSULE | Freq: Every day | ORAL | 0 refills | Status: DC

## 2016-04-08 MED FILL — OSELTAMIVIR PHOSPHATE 75 MG: 75 | 10 days supply | Qty: 10 | Fill #0

## 2016-04-08 NOTE — Telephone Encounter (Signed)
Agree.  He is very high risk.  Tamiflu 75 mg po qd for 10 days.

## 2016-04-08 NOTE — Telephone Encounter (Signed)
° ° °  Pt wife call to say she has the flu and pt has no symptons but because of his age she thinks he should have some tamiflu      [facility]

## 2016-04-08 NOTE — Telephone Encounter (Signed)
Pt is aware via voicemail that medication was sent to pharmacy. 

## 2016-04-29 MED FILL — CARBIDOPA-LEVODOPA-ENTA 100: 25-100-200 | 60 days supply | Qty: 360 | Fill #5

## 2016-05-07 MED FILL — AMOXICILLIN 500 MG CAPSULE: 500 | 7 days supply | Qty: 28 | Fill #0

## 2016-06-23 MED FILL — CARBIDOPA-LEVODOPA-ENTA 100: 25-100-200 | 60 days supply | Qty: 360 | Fill #6

## 2016-08-18 MED FILL — CARBIDOPA-LEVODOPA-ENTA 100: 25-100-200 | 30 days supply | Qty: 180 | Fill #7

## 2016-09-19 ENCOUNTER — Other Ambulatory Visit: Payer: Self-pay | Admitting: Neurology

## 2016-09-19 DIAGNOSIS — G20 Parkinson's disease: Secondary | ICD-10-CM

## 2016-09-22 ENCOUNTER — Ambulatory Visit: Payer: Self-pay | Admitting: Family Medicine

## 2016-09-22 ENCOUNTER — Ambulatory Visit: Payer: PPO | Admitting: Family Medicine

## 2016-09-22 ENCOUNTER — Emergency Department (HOSPITAL_COMMUNITY): Payer: PPO

## 2016-09-22 ENCOUNTER — Emergency Department (HOSPITAL_COMMUNITY)
Admission: EM | Admit: 2016-09-22 | Discharge: 2016-09-22 | Disposition: A | Discharge status: Home / Self Care | Payer: PPO | Attending: Emergency Medicine | Admitting: Emergency Medicine

## 2016-09-22 ENCOUNTER — Telehealth: Payer: Self-pay | Admitting: Neurology

## 2016-09-22 ENCOUNTER — Encounter (HOSPITAL_COMMUNITY): Payer: Self-pay

## 2016-09-22 DIAGNOSIS — R0602 Shortness of breath: Secondary | ICD-10-CM | POA: Diagnosis not present

## 2016-09-22 DIAGNOSIS — J40 Bronchitis, not specified as acute or chronic: Secondary | ICD-10-CM | POA: Diagnosis not present

## 2016-09-22 DIAGNOSIS — G20 Parkinson's disease: Secondary | ICD-10-CM

## 2016-09-22 DIAGNOSIS — R531 Weakness: Secondary | ICD-10-CM | POA: Diagnosis not present

## 2016-09-22 DIAGNOSIS — R05 Cough: Secondary | ICD-10-CM | POA: Diagnosis not present

## 2016-09-22 DIAGNOSIS — Z0289 Encounter for other administrative examinations: Secondary | ICD-10-CM

## 2016-09-22 DIAGNOSIS — R296 Repeated falls: Secondary | ICD-10-CM | POA: Insufficient documentation

## 2016-09-22 DIAGNOSIS — Z79891 Long term (current) use of opiate analgesic: Secondary | ICD-10-CM | POA: Insufficient documentation

## 2016-09-22 DIAGNOSIS — Z87891 Personal history of nicotine dependence: Secondary | ICD-10-CM | POA: Insufficient documentation

## 2016-09-22 DIAGNOSIS — W19XXXA Unspecified fall, initial encounter: Secondary | ICD-10-CM

## 2016-09-22 DIAGNOSIS — R404 Transient alteration of awareness: Secondary | ICD-10-CM | POA: Diagnosis not present

## 2016-09-22 HISTORY — DX: Unspecified fall, initial encounter: W19.XXXA

## 2016-09-22 LAB — CBC WITH DIFFERENTIAL/PLATELET
BASOS ABS: 0 10*3/uL (ref 0.0–0.1)
Basophils Relative: 0 %
EOS PCT: 1 %
Eosinophils Absolute: 0.1 10*3/uL (ref 0.0–0.7)
HCT: 40.1 % (ref 39.0–52.0)
Hemoglobin: 13.4 g/dL (ref 13.0–17.0)
LYMPHS ABS: 1.8 10*3/uL (ref 0.7–4.0)
LYMPHS PCT: 26 %
MCH: 29.3 pg (ref 26.0–34.0)
MCHC: 33.4 g/dL (ref 30.0–36.0)
MCV: 87.7 fL (ref 78.0–100.0)
MONO ABS: 0.7 10*3/uL (ref 0.1–1.0)
MONOS PCT: 10 %
NEUTROS ABS: 4.3 10*3/uL (ref 1.7–7.7)
Neutrophils Relative %: 63 %
Platelets: 148 10*3/uL — ABNORMAL LOW (ref 150–400)
RBC: 4.57 MIL/uL (ref 4.22–5.81)
RDW: 13.2 % (ref 11.5–15.5)
WBC: 6.9 10*3/uL (ref 4.0–10.5)

## 2016-09-22 LAB — COMPREHENSIVE METABOLIC PANEL
ALBUMIN: 3.7 g/dL (ref 3.5–5.0)
ALT: 5 U/L — ABNORMAL LOW (ref 17–63)
ANION GAP: 7 (ref 5–15)
AST: 18 U/L (ref 15–41)
Alkaline Phosphatase: 65 U/L (ref 38–126)
BUN: 31 mg/dL — AB (ref 6–20)
CHLORIDE: 106 mmol/L (ref 101–111)
CO2: 25 mmol/L (ref 22–32)
Calcium: 8.6 mg/dL — ABNORMAL LOW (ref 8.9–10.3)
Creatinine, Ser: 0.92 mg/dL (ref 0.61–1.24)
GFR calc Af Amer: >60 mL/min (ref >60)
GFR calc non Af Amer: >60 mL/min (ref >60)
GLUCOSE: 102 mg/dL — AB (ref 65–99)
POTASSIUM: 4.1 mmol/L (ref 3.5–5.1)
SODIUM: 138 mmol/L (ref 135–145)
TOTAL PROTEIN: 6.9 g/dL (ref 6.5–8.1)
Total Bilirubin: 0.6 mg/dL (ref 0.3–1.2)

## 2016-09-22 LAB — URINALYSIS, ROUTINE W REFLEX MICROSCOPIC
Bilirubin Urine: NEGATIVE
GLUCOSE, UA: NEGATIVE mg/dL
HGB URINE DIPSTICK: NEGATIVE
KETONES UR: 5 mg/dL — AB
LEUKOCYTES UA: NEGATIVE
Nitrite: NEGATIVE
PH: 5 (ref 5.0–8.0)
Protein, ur: NEGATIVE mg/dL
Specific Gravity, Urine: 1.027 (ref 1.005–1.030)

## 2016-09-22 MED ORDER — AMOXICILLIN-POT CLAVULANATE 875-125 MG PO TABS: 1.0000 | ORAL_TABLET | Freq: Two times a day (BID) | ORAL | 0 refills | Status: DC

## 2016-09-22 MED ORDER — GUAIFENESIN ER 1200 MG PO TB12: 1.0000 | ORAL_TABLET | Freq: Two times a day (BID) | ORAL | 0 refills | Status: AC

## 2016-09-22 MED ORDER — CARBIDOPA-LEVODOPA-ENTACAPONE 25-100-200 MG PO TABS: ORAL_TABLET | ORAL | 1 refills | Status: DC

## 2016-09-22 MED ORDER — DEXTROSE 5 % IV SOLN
2.0000 g | Freq: Once | INTRAVENOUS | Status: AC
  Administered 2016-09-22: 2 g via INTRAVENOUS
  Filled 2016-09-22: qty 2

## 2016-09-22 MED ORDER — BENZONATATE 100 MG PO CAPS: 100.0000 mg | ORAL_CAPSULE | Freq: Three times a day (TID) | ORAL | 0 refills | Status: DC

## 2016-09-22 MED FILL — CARBIDOPA-LEVODOPA-ENTA 100: 25-100-200 | 30 days supply | Qty: 180 | Fill #0

## 2016-09-22 NOTE — ED Notes (Signed)
[facility] Provider at bedside. 

## 2016-09-22 NOTE — Telephone Encounter (Signed)
[name] wife called the office, scheduled an appt and request additional refills for carbidopa-levodopa-entacapone (STALEVO) 25-100-200 MG tablet to last until pt is seen on 10/2

## 2016-09-22 NOTE — Telephone Encounter (Signed)
[facility], which should allow pt enough to get him until 11/18/2016.

## 2016-09-22 NOTE — ED Triage Notes (Signed)
Per [facility]. Pt resides at home. Pt normally independent. Pt c/o of generalized weakness x 3 days. Nonproductive cough x 2 months. Denies N/V/d and fever. Pt fall x 4 in 3 days. No LOC. No injuries states pt. No trauma noted. Right side lung fields rales. Left side lung fields Rhonchi.

## 2016-09-22 NOTE — ED Provider Notes (Signed)
[facility]-EMERGENCY DEPT Provider Note   CSN: [id number] Arrival date & time: 09/22/16  1744     History   Chief Complaint Chief Complaint  Patient presents with  . Extremity Weakness  . Cough  . Fall    HPI [name] is a 76 y.o. male.  HPI Patient presents to the emergency department with general weakness over the last few days.  The patient states that he has been having more difficulty walking.  Patient states that multiple falls over the last couple of days.  Patient, states she has had some cough for about the last 2 weeks.  Patient states that he normally uses a walker and wheelchair for mobility. The patient denies chest pain, shortness of breath, headache,blurred vision, neck pain, fever,numbness, dizziness, anorexia, edema, abdominal pain, nausea, vomiting, diarrhea, rash, back pain, dysuria, hematemesis, bloody stool, near syncope, or syncope. Past Medical History:  Diagnosis Date  . Fall   . Hypothyroidism   . Morbid obesity (HCC) 07/14/2012  . Obesity   . Parkinson disease (HCC)   . UTI (urinary tract infection)   . Venous stasis    edema    Patient Active Problem List   Diagnosis Date Noted  . Recurrent falls 08/10/2014  . Falls 08/10/2014  . Weakness   . Parkinson disease (HCC)   . Morbid obesity (HCC) 07/14/2012  . WEAKNESS 01/30/2010  . URINARY INCONTINENCE 01/30/2010  . DYSURIA 11/13/2009  . PARKINSON'S DISEASE 01/29/2009    Past Surgical History:  Procedure Laterality Date  . anterior cervical discectomy C6-7    . APPENDECTOMY    . CERVICAL LAMINECTOMY    . FOOT SURGERY  1992   right foot repair       Home Medications    Prior to Admission medications   Medication Sig Start Date End Date Taking? Authorizing Provider  calcium carbonate (TUMS - DOSED IN MG ELEMENTAL CALCIUM) 500 MG chewable tablet Chew 1-2 tablets by mouth 3 (three) times daily as needed for indigestion.     [provider]  carbidopa-levodopa-entacapone (STALEVO)  25-100-200 MG tablet TAKE 1 TABLET BY MOUTH 6 TIMES DAILY. 09/22/16   Huston Foley, MD  oseltamivir (TAMIFLU) 75 MG capsule Take 1 capsule (75 mg total) by mouth daily. 04/08/16   Burchette, Elberta Fortis, MD    Family History Family History  Problem Relation Age of Onset  . Heart disease Mother   . Hyperlipidemia Mother   . Colon cancer Father   . Alcohol abuse Other   . Cancer Sister        breast     Social History Social History  Substance Use Topics  . Smoking status: Former Smoker    Years: 2.00    Types: Cigarettes    Quit date: 03/26/1992  . Smokeless tobacco: Never Used  . Alcohol use No     Allergies   Patient has no known allergies.   Review of Systems Review of Systems All other systems negative except as documented in the HPI. All pertinent positives and negatives as reviewed in the HPI.  Physical Exam Updated Vital Signs BP 128/74 (BP Location: Right Arm)   Pulse 64   Temp 98.4 F (36.9 C) (Oral)   Resp 20   Ht 6\' 2"  (1.88 m)   Wt 106.6 kg (235 lb)   SpO2 98%   BMI 30.17 kg/m   Physical Exam  Constitutional: He is oriented to person, place, and time. He appears well-developed and well-nourished. No distress.  HENT:  Head: Normocephalic and atraumatic.  Mouth/Throat: Oropharynx is clear and moist.  Eyes: Pupils are equal, round, and reactive to light.  Neck: Normal range of motion. Neck supple.  Cardiovascular: Normal rate, regular rhythm and normal heart sounds.  Exam reveals no gallop and no friction rub.   No murmur heard. Pulmonary/Chest: Effort normal. No respiratory distress. He has no wheezes. He has rhonchi.  Abdominal: Soft. Bowel sounds are normal. He exhibits no distension. There is no tenderness.  Neurological: He is alert and oriented to person, place, and time. He exhibits normal muscle tone. Coordination normal.  Skin: Skin is warm and dry. Capillary refill takes less than 2 seconds. No rash noted. No erythema.  Psychiatric: He has a  normal mood and affect. His behavior is normal.  Nursing note and vitals reviewed.    ED Treatments / Results  Labs (all labs ordered are listed, but only abnormal results are displayed) Labs Reviewed  COMPREHENSIVE METABOLIC PANEL - Abnormal; Notable for the following:       Result Value   Glucose, Bld 102 (*)    BUN 31 (*)    Calcium 8.6 (*)    ALT 5 (*)    All other components within normal limits  CBC WITH DIFFERENTIAL/PLATELET - Abnormal; Notable for the following:    Platelets 148 (*)    All other components within normal limits  URINALYSIS, ROUTINE W REFLEX MICROSCOPIC - Abnormal; Notable for the following:    Color, Urine AMBER (*)    Ketones, ur 5 (*)    All other components within normal limits    EKG  EKG Interpretation  Date/Time:  Monday September 22 2016 18:40:40 EDT Ventricular Rate:  58 PR Interval:    QRS Duration: 106 QT Interval:  450 QTC Calculation: 442 R Axis:   13 Text Interpretation:  Sinus rhythm Low voltage, precordial leads No significant change since last tracing Confirmed by Shaune PollackIsaacs, Cameron [phone]([id number]) on 09/22/2016 7:51:11 PM       Radiology Dg Chest 2 View  Result Date: 09/22/2016 CLINICAL DATA:  Shortness of breath and cough. EXAM: CHEST  2 VIEW COMPARISON:  08/24/2015 FINDINGS: The lungs are clear without focal pneumonia, edema, pneumothorax or pleural effusion. The cardiopericardial silhouette is within normal limits for size. The visualized bony structures of the thorax are intact. Telemetry leads overlie the chest. IMPRESSION: No active cardiopulmonary disease. Electronically Signed   By: Kennith CenterEric  Mansell M.D.   On: 09/22/2016 19:02    Procedures Procedures (including critical care time)  Medications Ordered in ED Medications  cefTRIAXone (ROCEPHIN) 2 g in dextrose 5 % 50 mL IVPB (2 g Intravenous New Bag/Given 09/22/16 2057)     Initial Impression / Assessment and Plan / ED Course  I have reviewed the triage vital signs and the nursing  notes.  Pertinent labs & imaging results that were available during my care of the patient were reviewed by me and considered in my medical decision making (see chart for details).     Patient would like to be discharged home and follow his primary doctor.  The patient's wife states that she feels that he can go home as well.  The patient's laboratory testing did not show any significant abnormalities will be treated with antibiotics due to his cough and duration.  Patient advised return for any worsening in his condition Final Clinical Impressions(s) / ED Diagnoses   Final diagnoses:  None    New Prescriptions New Prescriptions   No medications  on file     Charlestine Night, Cordelia Poche 09/23/16 Lafayette Dragon, MD 09/23/16 [phone]

## 2016-09-22 NOTE — Discharge Instructions (Signed)
Return here as needed.  Follow-up with your primary Dr. for a recheck

## 2016-09-23 MED FILL — AMOX TR-K CLV 875-125 MG TA: 875-125 | 10 days supply | Qty: 20 | Fill #0

## 2016-09-23 MED FILL — BENZONATATE 100 MG CAP: 100 | 7 days supply | Qty: 21 | Fill #0

## 2016-10-15 ENCOUNTER — Other Ambulatory Visit: Payer: Self-pay | Admitting: Neurology

## 2016-10-15 DIAGNOSIS — G20 Parkinson's disease: Secondary | ICD-10-CM

## 2016-10-16 ENCOUNTER — Other Ambulatory Visit: Payer: Self-pay

## 2016-10-16 DIAGNOSIS — G20 Parkinson's disease: Secondary | ICD-10-CM

## 2016-10-16 MED ORDER — CARBIDOPA-LEVODOPA-ENTACAPONE 25-100-200 MG PO TABS: ORAL_TABLET | ORAL | 0 refills | Status: DC

## 2016-10-17 ENCOUNTER — Telehealth: Payer: Self-pay | Admitting: Neurology

## 2016-10-17 DIAGNOSIS — G20 Parkinson's disease: Secondary | ICD-10-CM

## 2016-10-17 MED ORDER — CARBIDOPA-LEVODOPA-ENTACAPONE 25-100-200 MG PO TABS: ORAL_TABLET | ORAL | 3 refills | Status: DC

## 2016-10-17 MED FILL — CARBIDOPA-LEVODOPA-ENTA 100: 25-100-200 | 30 days supply | Qty: 180 | Fill #0

## 2016-10-17 NOTE — Telephone Encounter (Signed)
Rx for gen. Stalevo renewed as requested, but pt has not been seein in over 1 year. Needs to keep appt. Nothing further needed.

## 2016-10-17 NOTE — Telephone Encounter (Signed)
[name] wife called said he will run out of carbidopa-levodopa-entacapone (STALEVO) 25-100-200 MG tablet before his appt on 10/2. She said he is taking 1 every 4 hours (literally). She said he wakes up in the night and takes a pill, if not he cannot walk when he gets up. She said he will be 2 days short by 10/2. Please [facility]. She is aware the clinic closes at noon today and is closed on Monday for Labor day

## 2016-11-06 ENCOUNTER — Encounter: Payer: Self-pay | Admitting: Family Medicine

## 2016-11-17 MED FILL — CARBIDOPA-LEVODOPA-ENTA 100: 25-100-200 | 30 days supply | Qty: 180 | Fill #0

## 2016-11-18 ENCOUNTER — Ambulatory Visit: Payer: Self-pay | Admitting: Neurology

## 2016-11-20 ENCOUNTER — Encounter: Payer: Self-pay | Admitting: Neurology

## 2016-11-20 ENCOUNTER — Ambulatory Visit (INDEPENDENT_AMBULATORY_CARE_PROVIDER_SITE_OTHER): Payer: PPO | Admitting: Neurology

## 2016-11-20 DIAGNOSIS — G20 Parkinson's disease: Secondary | ICD-10-CM

## 2016-11-20 MED ORDER — CARBIDOPA-LEVODOPA-ENTACAPONE 25-100-200 MG PO TABS: 1.0000 | ORAL_TABLET | Freq: Every day | ORAL | 3 refills | Status: DC

## 2016-11-20 NOTE — Patient Instructions (Addendum)
We will continue with your Stalevo 1 pill 6 times a day, at 6, 9, 12, 3 PM, 6 PM and 9 PM.   We will do a swallow study and call you with the result.

## 2016-11-20 NOTE — Progress Notes (Signed)
Subjective:    Patient ID: [name] is a 76 y.o. male.  HPI     Interim history:   [name] is a very pleasant 76 year old left-handed gentleman with an underlying medical history of degenerative disc disease, alcoholism in the past, history of syncope, obesity, remote history of pneumonia, status post neck surgery at C6-7, who presents for followup consultation of his advanced right-sided predominant Parkinson's disease, complicated by dyskinesias, recurrent falls, and prior medication intolerances, Dx dating back to 06/1994. He is accompanied by his wife today, and presents after over one year. I last saw him on 09/17/2015, at which time he reported more issues with his balance and tendency to fall. He felt worse in the afternoon or early evening. He had more fatigue and weakness. He was trying to go for exercise under a trainer 3 times a week. He recently lost his sister from cancer. He had presented to the emergency room in January after a fall. He also sustained a fall and injured his right distal leg requiring stitches. He presented to the emergency room in July after a fall. He had a head CT without contrast on 08/24/2015, which I reviewed: IMPRESSION: Mild atrophy with patchy periventricular small vessel disease, stable. No intracranial mass, hemorrhage, or extra-axial fluid collection. No acute infarct evident. There are foci of arterial vascular calcification. There is leftward deviation of the nasal septum. There is probable cerumen in each external auditory canal.   Today, 11/20/2016 (all dictated new, as well as above notes, some dictation done in note pad or Word, outside of chart, may appear as copied):   He reports doing okay, has fallen more. Is  Is mostly confined to wheelchair outside and walker at the house, they are putting hardwood floor in because it is difficult for him to maneuver over carpet. He has had recurrent falls. He has had a recurrent cough, no fevers. He  presented to the emergency room for history of frequent falls, cough and generalized weakness. He was suspected to have bronchitis. He was given IV antibiotic 1, 09/22/16.   The patient's allergies, current medications, family history, past medical history, past social history, past surgical history and problem list were reviewed and updated as appropriate.   Previously (copied from previous notes for reference):   Of note, he missed an appointment on 08/01/2015. I saw him on 12/04/2014, at which time he reported doing reasonably well, no falls since June 2016, was using his walker more consistently, was trying to drink enough water. His wife was working full-time as a Designer, jewellery. He had a male aide come in twice a week for about 6 hours to help him with his laundry, showering and other chores. I suggested he continue with his current medication regimen. He no showed for appointment in February 2017.    I saw him on 05/22/2014, at which time he reported no recent falls. He was using his cane usually after 5 PM when he would become more dyskinetic. He did admit to avoid driving at [place] and [place]. He felt that symptoms were stable. He was trying to go to the gym regularly for exercise. In the interim, he presented to the emergency room and was hospitalized for a day on 08/10/2014 2 08/11/2014 secondary to left leg weakness and recurrent falls and progressive decline. He had workup with a head CT and MRI which I reviewed: CT head without contrast on 08/10/2014 showed:  No acute intracranial pathology. Mild age-related atrophy and  chronic microvascular ischemic disease. If symptoms persist and there are no contraindications, MRI may provide better evaluation if clinically indicated.     MRI brain without contrast on 08/10/2014 showed:  1. No acute intracranial abnormality. Mild for age nonspecific cerebral white matter signal changes. 2. Chronic cervical spinal stenosis, that seen at  C3-C4 appears grossly stable since 2011. In addition, I personally reviewed the images through the PACS system. I agree with the findings.   Left hip x-ray on 08/10/2014 was negative. Left hip MRI on 08/10/2014 showed: No acute abnormality.  Negative for fracture.   Small amount of fluid in the left trochanteric bursa compatible with bursitis.   I saw him on 10/26/2013, at which time his wife reported a fall in July. She was supposed to start a new job and was worried about leaving him alone during the day and requested a letter from me on behalf of his [name], to help him transfer through [place] in [place], [name] to [place], [name], so he could stay with them. I kept him on Stalevo. I restarted him on Azilect as an adjunct. Of note, he canceled an appointment with me on 03/07/2014.      I saw him on 01/11/13, at which time I counseled him regarding staying within the prescribed amount of Stalevo. I kept him on Stalevo 6 times a day. I suggested adding Azilect. We talked about his prior diagnosis of obstructive sleep apnea in 2008 and I suggested he be retested for sleep apnea and ordered a sleep study. However, he did not come back for the sleep study, as he did not feel like he wanted to do it. Of note, he no showed for an appointment on 07/12/2013, as he did not have a ride.     He fell on 09/02/13 and hit his head and went to the ER, where he had [place], which I reviewed: 1. No acute intracranial abnormality. 2. Stable moderate generalized atrophy and mild to moderate chronic microvascular ischemic changes of the white matter. He stopped the Azilect, as he did not feel better.       I first met him on 07/14/2012, at which time I felt he was stable and it did not make any medication changes. He requested a letter to excuse him from jury duty which I provided. In the interim, I was notified that he was trying to fill his Stalevo prescription earlier than scheduled and perhaps overusing Stalevo.  The pharmacy note from [facility] stated that he presented there demanding a refill and threatening to go to the emergency room if he would not get his prescription.     He previously followed with Dr. Morene Antu and was last seen by him on 02/25/2012, at which time Dr. Erling Cruz felt that the patient was doing well. He was exercising regularly.   He was diagnosed with right-sided predominant Parkinson's disease in May 1996 by Dr. Morrell Riddle. He was on selegiline 5 mg in the beginning and in 1999 he was started on amantadine. He discontinued his these medications and started Mirapex but had side effects and discontinued it after a month. He started seeing Dr. Erling Cruz in June 2000 and was re-started on selegiline and amantadine. He was then started on Permax. This was changed and 2005 to Stalevo and Requip was added but discontinued because of excessive daytime somnolence. He has a history of obesity and lost 60 pounds with an exercise program. He has had some memory loss. He had  neck surgery in 1996. A sleep study in April 2008 showed hypopneas. In January of this year his MMSE was 29, clock drawing was 4, animal fluency was 13.   He goes to [employer] 3 times a week on M/W/F. He lives with his wife in his own 2 storey home. His bedroom is downstairs. He snores and therefore, sleeps in a separate bedroom. His wife is in school to become a NP. He has fallen in the past.   I reviewed his sleep study from 05/27/06: AHI was 10.1, based on 62 hypopneas and his REM AHI 27.1/h, his O2 nadir was 78% and he spent 17.6 minutes below the saturation of 88%. It was recommended that he come back for CPAP titration but that never came to fruition for one reason or another. He stated that he was told he did not have to be treated with CPAP.   His Past Medical History Is Significant For: Past Medical History:  Diagnosis Date  . Fall   . Hypothyroidism   . Morbid obesity (HCC) 07/14/2012  . Obesity   . Parkinson disease (HCC)   .  UTI (urinary tract infection)   . Venous stasis    edema    His Past Surgical History Is Significant For: Past Surgical History:  Procedure Laterality Date  . anterior cervical discectomy C6-7    . APPENDECTOMY    . CERVICAL LAMINECTOMY    . FOOT SURGERY  1992   right foot repair    His Family History Is Significant For: Family History  Problem Relation Age of Onset  . Heart disease Mother   . Hyperlipidemia Mother   . Colon cancer Father   . Alcohol abuse Other   . Cancer Sister        breast     His Social History Is Significant For: Social History   Social History  . Marital status: Married    Spouse name: [place] Sink  . Number of children: 3  . Years of education: college   Occupational History  .  Retired   Social History Main Topics  . Smoking status: Former Smoker    Years: 2.00    Types: Cigarettes    Quit date: 03/26/1992  . Smokeless tobacco: Never Used  . Alcohol use No  . Drug use: No  . Sexual activity: Not Asked   Other Topics Concern  . None   Social History Narrative   Regular exercise-No   Retired,is left handed and resides with wife    His Allergies Are:  No Known Allergies:   His Current Medications Are:  Outpatient Encounter Prescriptions as of 11/20/2016  Medication Sig  . carbidopa-levodopa-entacapone (STALEVO) 25-100-200 MG tablet TAKE 1 TABLET BY MOUTH 6 TIMES DAILY.  Marland Kitchen Guaifenesin 1200 MG TB12 Take 1 tablet (1,200 mg total) by mouth 2 (two) times daily.  . [DISCONTINUED] amoxicillin-clavulanate (AUGMENTIN) 875-125 MG tablet Take 1 tablet by mouth every 12 (twelve) hours.  . [DISCONTINUED] benzonatate (TESSALON) 100 MG capsule Take 1 capsule (100 mg total) by mouth every 8 (eight) hours.  . [DISCONTINUED] calcium carbonate (TUMS - DOSED IN MG ELEMENTAL CALCIUM) 500 MG chewable tablet Chew 1-2 tablets by mouth 3 (three) times daily as needed for indigestion.   . [DISCONTINUED] oseltamivir (TAMIFLU) 75 MG capsule Take 1 capsule (75 mg  total) by mouth daily.   No facility-administered encounter medications on file as of 11/20/2016.   :  Review of Systems:  Out of a complete 14 point review of  systems, all are reviewed and negative with the exception of these symptoms as listed below:  Review of Systems  Neurological:       Pt presents today to discuss his PD. Pt is doing well on stalevo.    Objective:  Neurological Exam  Physical Exam Physical Examination:   Vitals:   11/20/16 0825  BP: 101/69  Pulse: (!) 59   General Examination: The patient is a very pleasant 76 y.o. male in no acute distress. He appears frail and more deconditioned appearing.   HEENT: Normocephalic, atraumatic, pupils are equal, round and reactive to light and accommodation. Extraocular tracking shows moderate saccadic breakdown without nystagmus noted. There is limitation to upper gaze. There is mild decrease in eye blink rate. Hearing is intact. Face is symmetric with moderate facial masking and mouth is open most of the time with normal facial sensation and mild/head/neck facial dyskinesias. There is no lip, neck or jaw tremor. Neck is moderately rigid with intact passive ROM. Oropharynx exam reveals no significant mouth dryness, mild drooling. Mallampati is class II. Tongue protrudes centrally and palate elevates symmetrically.   Chest: is clear to auscultation without wheezing, rhonchi or crackles noted.  Heart: sounds are regular and normal without murmurs, rubs or gallops noted.   Abdomen: is soft, non-tender and non-distended with normal bowel sounds appreciated on auscultation.  Extremities: There is trace pitting edema in the distal lower extremities bilaterally.   Skin: is warm and dry with no trophic changes noted. Age-related changes are noted on the skin. Older bruises.   Musculoskeletal: exam reveals no obvious joint deformities, tenderness, joint swelling or erythema.  Neurologically:   Mental status: The patient  is awake and alert, paying good attention. He is able to add to history, wife provides most of his history. He is oriented to: person, place, situation, day of week, month of year and year. His memory, attention, language and knowledge are fairly well intact. There is a mild degree of bradyphrenia. Speech is moderately hypophonic with mild dysarthria noted. Mood is congruent and affect is normal. Raspy voice, cough.    Cranial nerves are as described above under HEENT exam. In addition, shoulder shrug is normal with equal shoulder height noted.  Motor exam: thinner bulk, and strength for age is noted. There are mild generalized dyskinesias noted. Tone is mildly rigid with presence of cogwheeling in the bilateral upper extremities. There is overall moderate bradykinesia. There is no drift or rebound.  There is no tremor. Romberg is not tested today, as he has trouble standing.   Reflexes are trace in the upper extremities and absent in the lower extremities.   Fine motor skills exam:  findings are moderate to severely impaired on the right and moderately impaired on the left.  Cerebellar testing shows no dysmetria or intention tremor.   Sensory exam is intact to light touch in the upper and lower extremities.   Gait, station and balance: I did not have him stand or walk for me today for safety.      Assessment and Plan:   In summary, SHYKEEM RESURRECCION is a very pleasant 76 year old male with a history of advanced Parkinson's disease, right-sided predominant, complicated by obesity, dyskinesias, medication intolerances in the past and progressive difficulty with gait and balance and recurrent falls. He has been tried on amantadine, Permax, Mirapex, selegiline, and Requip in the past. He has been reasonably stable on the current regimen. Unfortunately, he had several falls, thankfully without sustaining any serious  injuries, nevertheless, he is at risk for falls and we talked about fall safety again  today. We will do a swallow study as he has had recurrent cough, is possibly at risk for silent aspiration, in the interim, his wife is advised to try over-the-counter acid reflux medication and also try allergy medication to see if the cough improves. She works full-time as a Designer, jewellery. She is very knowledgeable. She also worked in the neuro ICU in the past. We will continue with Stalevo 1 pill 6 times a day at about 3 hour intervals. I suggested a 6 month follow-up, sooner as needed. I answered all their questions today and the patient and his wife were in agreement.I spent 25 minutes in total face-to-face time with the patient, more than 50% of which was spent in counseling and coordination of care, reviewing test results, reviewing medication and discussing or reviewing the diagnosis of PD, its prognosis and treatment options. Pertinent laboratory and imaging test results that were available during this visit with the patient were reviewed by me and considered in my medical decision making (see chart for details).

## 2016-12-08 ENCOUNTER — Other Ambulatory Visit (HOSPITAL_COMMUNITY): Payer: Self-pay | Admitting: Neurology

## 2016-12-08 DIAGNOSIS — R1310 Dysphagia, unspecified: Secondary | ICD-10-CM

## 2016-12-11 MED FILL — CARBIDOPA-LEVODOPA-ENTA 100: 25-100-200 | 30 days supply | Qty: 180 | Fill #1

## 2016-12-25 ENCOUNTER — Ambulatory Visit (HOSPITAL_COMMUNITY): Payer: PPO

## 2016-12-25 ENCOUNTER — Inpatient Hospital Stay (HOSPITAL_COMMUNITY): Admission: RE | Admit: 2016-12-25 | Payer: PPO | Source: Ambulatory Visit

## 2017-01-01 MED FILL — CARBIDOPA-LEVODOPA-ENTA 100: 25-100-200 | 6 days supply | Qty: 36 | Fill #2

## 2017-01-06 MED FILL — CARBIDOPA-LEVODOPA-ENTA 100: 25-100-200 | 30 days supply | Qty: 180 | Fill #3

## 2017-02-05 ENCOUNTER — Other Ambulatory Visit: Payer: Self-pay | Admitting: Neurology

## 2017-02-05 DIAGNOSIS — G20 Parkinson's disease: Secondary | ICD-10-CM

## 2017-02-05 MED FILL — CARBIDOPA-LEVODOPA-ENTA 100: 25-100-200 | 30 days supply | Qty: 180 | Fill #0

## 2017-03-10 MED FILL — CARBIDOPA-LEVODOPA-ENTA 100: 25-100-200 | 30 days supply | Qty: 180 | Fill #1

## 2017-04-06 MED FILL — CARBIDOPA-LEVODOPA-ENTA 100: 25-100-200 | 30 days supply | Qty: 180 | Fill #2

## 2017-04-25 ENCOUNTER — Encounter (HOSPITAL_BASED_OUTPATIENT_CLINIC_OR_DEPARTMENT_OTHER): Payer: Self-pay | Admitting: Emergency Medicine

## 2017-04-25 ENCOUNTER — Emergency Department (HOSPITAL_BASED_OUTPATIENT_CLINIC_OR_DEPARTMENT_OTHER): Payer: PPO

## 2017-04-25 ENCOUNTER — Other Ambulatory Visit: Payer: Self-pay

## 2017-04-25 ENCOUNTER — Emergency Department (HOSPITAL_BASED_OUTPATIENT_CLINIC_OR_DEPARTMENT_OTHER)
Admission: EM | Admit: 2017-04-25 | Discharge: 2017-04-25 | Disposition: A | Discharge status: Home / Self Care | Payer: PPO | Attending: Emergency Medicine | Admitting: Emergency Medicine

## 2017-04-25 DIAGNOSIS — E039 Hypothyroidism, unspecified: Secondary | ICD-10-CM | POA: Diagnosis not present

## 2017-04-25 DIAGNOSIS — Y9389 Activity, other specified: Secondary | ICD-10-CM | POA: Diagnosis not present

## 2017-04-25 DIAGNOSIS — G20 Parkinson's disease: Secondary | ICD-10-CM | POA: Insufficient documentation

## 2017-04-25 DIAGNOSIS — S61215A Laceration without foreign body of left ring finger without damage to nail, initial encounter: Secondary | ICD-10-CM | POA: Insufficient documentation

## 2017-04-25 DIAGNOSIS — W01198A Fall on same level from slipping, tripping and stumbling with subsequent striking against other object, initial encounter: Secondary | ICD-10-CM | POA: Diagnosis not present

## 2017-04-25 DIAGNOSIS — Y998 Other external cause status: Secondary | ICD-10-CM | POA: Insufficient documentation

## 2017-04-25 DIAGNOSIS — S63259A Unspecified dislocation of unspecified finger, initial encounter: Secondary | ICD-10-CM

## 2017-04-25 DIAGNOSIS — W19XXXA Unspecified fall, initial encounter: Secondary | ICD-10-CM

## 2017-04-25 DIAGNOSIS — S63297A Dislocation of distal interphalangeal joint of left little finger, initial encounter: Secondary | ICD-10-CM | POA: Diagnosis not present

## 2017-04-25 DIAGNOSIS — Y929 Unspecified place or not applicable: Secondary | ICD-10-CM | POA: Diagnosis not present

## 2017-04-25 DIAGNOSIS — Z87891 Personal history of nicotine dependence: Secondary | ICD-10-CM | POA: Insufficient documentation

## 2017-04-25 DIAGNOSIS — S63295A Dislocation of distal interphalangeal joint of left ring finger, initial encounter: Secondary | ICD-10-CM | POA: Insufficient documentation

## 2017-04-25 MED ORDER — LIDOCAINE HCL (PF) 2 % IJ SOLN
10.0000 mL | Freq: Once | INTRAMUSCULAR | Status: AC
  Administered 2017-04-25: 10 mL via INTRADERMAL

## 2017-04-25 MED ORDER — CEPHALEXIN 250 MG PO CAPS
250.0000 mg | ORAL_CAPSULE | Freq: Once | ORAL | Status: AC
  Administered 2017-04-25: 250 mg via ORAL
  Filled 2017-04-25: qty 1

## 2017-04-25 MED ORDER — CEPHALEXIN 250 MG PO CAPS: 250.0000 mg | ORAL_CAPSULE | Freq: Four times a day (QID) | ORAL | 0 refills | Status: AC

## 2017-04-25 MED ORDER — LIDOCAINE HCL (PF) 2 % IJ SOLN
INTRAMUSCULAR | Status: AC
  Filled 2017-04-25: qty 4

## 2017-04-25 NOTE — ED Triage Notes (Addendum)
Tripped and fell today inside. C/o L ring finger pain and swelling. Laceration noted to back of finger, bleeding controlled. Denies LOC, neck or back pain.

## 2017-04-25 NOTE — ED Provider Notes (Signed)
[facility] EMERGENCY DEPARTMENT Provider Note   CSN: [id number] Arrival date & time: 04/25/17  1658     History   Chief Complaint Chief Complaint  Patient presents with  . Fall    HPI [name] is a 77 y.o. male.  HPI  77 year old male presents after a fall.  He was pushing a cart in the cart got too far ahead of him and he fell forward.  He hit his hand on the hardwood floor.  He has a laceration to the palmar aspect of his left ring finger.  He did not injure anything else.  Last tetanus was 2015. No blood thinners.   Past Medical History:  Diagnosis Date  . Fall   . Hypothyroidism   . Morbid obesity (HCC) 07/14/2012  . Obesity   . Parkinson disease (HCC)   . UTI (urinary tract infection)   . Venous stasis    edema    Patient Active Problem List   Diagnosis Date Noted  . Recurrent falls 08/10/2014  . Falls 08/10/2014  . Weakness   . Parkinson disease (HCC)   . Morbid obesity (HCC) 07/14/2012  . WEAKNESS 01/30/2010  . URINARY INCONTINENCE 01/30/2010  . DYSURIA 11/13/2009  . PARKINSON'S DISEASE 01/29/2009    Past Surgical History:  Procedure Laterality Date  . anterior cervical discectomy C6-7    . APPENDECTOMY    . CERVICAL LAMINECTOMY    . FOOT SURGERY  1992   right foot repair       Home Medications    Prior to Admission medications   Medication Sig Start Date End Date Taking? Authorizing Provider  carbidopa-levodopa-entacapone (STALEVO) 25-100-200 MG tablet Take 1 tablet by mouth 6 (six) times daily. At 6, 9, 12, 3 PM, 6 PM, and 9 PM 11/20/16   Huston FoleyAthar, Saima, MD  cephALEXin (KEFLEX) 250 MG capsule Take 1 capsule (250 mg total) by mouth 4 (four) times daily for 5 days. 04/25/17 04/30/17  Pricilla LovelessGoldston, Chantell Kunkler, MD  Guaifenesin 1200 MG TB12 Take 1 tablet (1,200 mg total) by mouth 2 (two) times daily. 09/22/16   Charlestine NightLawyer, Christopher, PA-C    Family History Family History  Problem Relation Age of Onset  . Heart disease Mother   . Hyperlipidemia  Mother   . Colon cancer Father   . Alcohol abuse Other   . Cancer Sister        breast     Social History Social History   Tobacco Use  . Smoking status: Former Smoker    Years: 2.00    Types: Cigarettes    Last attempt to quit: 03/26/1992    Years since quitting: 25.1  . Smokeless tobacco: Never Used  Substance Use Topics  . Alcohol use: No    Alcohol/week: 0.0 oz  . Drug use: No     Allergies   Patient has no known allergies.   Review of Systems Review of Systems  Musculoskeletal: Positive for arthralgias.  Skin: Positive for wound.  All other systems reviewed and are negative.    Physical Exam Updated Vital Signs BP 117/73 (BP Location: Right Arm)   Pulse (!) 58   Temp 97.7 F (36.5 C) (Oral)   Resp 16   Ht 6\' 3"  (1.905 m)   Wt 104.3 kg (230 lb)   SpO2 99%   BMI 28.75 kg/m   Physical Exam  Constitutional: He is oriented to person, place, and time. He appears well-developed and well-nourished.  HENT:  Head: Normocephalic and atraumatic.  Right Ear: External ear normal.  Left Ear: External ear normal.  Nose: Nose normal.  Eyes: Right eye exhibits no discharge. Left eye exhibits no discharge.  Neck: Neck supple.  Cardiovascular: Normal rate and regular rhythm.  Pulses:      Femoral pulses are 2+ on the left side. Pulmonary/Chest: Effort normal.  Musculoskeletal: He exhibits no edema.       Left hand: He exhibits deformity and laceration.       Hands: Neurological: He is alert and oriented to person, place, and time.  Skin: Skin is warm and dry.  Nursing note and vitals reviewed.    ED Treatments / Results  Labs (all labs ordered are listed, but only abnormal results are displayed) Labs Reviewed - No data to display  EKG  EKG Interpretation None       Radiology Dg Finger Ring Left  Result Date: 04/25/2017 CLINICAL DATA:  S/p left distal ring finger dislocation.  Reduction. EXAM: LEFT RING FINGER 2+V COMPARISON:  None. FINDINGS: Osseous  alignment is now normal. No fracture line or displaced fracture fragment. Adjacent soft tissues are unremarkable. IMPRESSION: Osseous alignment is now normal status post reduction. Electronically Signed   By: Bary Richard M.D.   On: 04/25/2017 20:02   Dg Finger Ring Left  Result Date: 04/25/2017 CLINICAL DATA:  Recent fall with fourth digit laceration, initial encounter EXAM: LEFT RING FINGER 2+V COMPARISON:  None. FINDINGS: There is posterior dislocation of the distal phalanx with respect to the middle phalanx of the fourth digit. Tiny bony density is noted at the base of the fourth middle phalanx along the palmar aspect which may be acute in nature. Mild soft tissue swelling is noted. IMPRESSION: Posterior dislocation of the distal phalanx at the DIP. Question small avulsion fracture at the base of the fourth middle phalanx. Electronically Signed   By: Alcide Clever M.D.   On: 04/25/2017 17:27    Procedures .Marland KitchenLaceration Repair Date/Time: 04/25/2017 7:43 PM Performed by: Pricilla Loveless, MD Authorized by: Pricilla Loveless, MD   Consent:    Consent obtained:  Verbal   Consent given by:  Patient Anesthesia (see MAR for exact dosages):    Anesthesia method:  Nerve block   Block location:  Digital   Block needle gauge:  27 G   Block anesthetic:  Lidocaine 2% w/o epi   Block injection procedure:  Negative aspiration for blood, anatomic landmarks identified, anatomic landmarks palpated and introduced needle   Block outcome:  Anesthesia achieved Laceration details:    Location:  Finger   Finger location:  L ring finger   Length (cm):  2 Repair type:    Repair type:  Simple Pre-procedure details:    Preparation:  Patient was prepped and draped in usual sterile fashion and imaging obtained to evaluate for foreign bodies Treatment:    Area cleansed with:  Saline   Amount of cleaning:  Standard   Irrigation solution:  Sterile saline   Irrigation method:  Syringe Skin repair:    Repair method:   Sutures   Suture size:  4-0   Suture material:  Nylon   Number of sutures:  3 Approximation:    Approximation:  Close Post-procedure details:    Dressing:  Antibiotic ointment   Patient tolerance of procedure:  Tolerated well, no immediate complications Reduction of dislocation Date/Time: 04/25/2017 7:44 PM Performed by: Pricilla Loveless, MD Authorized by: Pricilla Loveless, MD  Consent: Verbal consent obtained. Risks and benefits: risks, benefits and alternatives were discussed  Consent given by: patient Patient identity confirmed: verbally with patient Preparation: Patient was prepped and draped in the usual sterile fashion. Local anesthesia used: yes Anesthesia: digital block  Anesthesia: Local anesthesia used: yes  Sedation: Patient sedated: no  Patient tolerance: Patient tolerated the procedure well with no immediate complications    (including critical care time)  Medications Ordered in ED Medications  lidocaine (XYLOCAINE) 2 % injection 10 mL (10 mLs Intradermal Given 04/25/17 1906)  cephALEXin (KEFLEX) capsule 250 mg (250 mg Oral Given 04/25/17 1945)     Initial Impression / Assessment and Plan / ED Course  I have reviewed the triage vital signs and the nursing notes.  Pertinent labs & imaging results that were available during my care of the patient were reviewed by me and considered in my medical decision making (see chart for details).     Patient is not tender over the area of possible avulsion seen on x-ray.  After digital block, the dislocation was reduced.  It was irrigated with saline and his wound was closed.  It appears clean.  His tetanus is up-to-date from 2015.  Given that the laceration is over the spot of the dislocation, he will be treated with Keflex for prophylaxis.  He will be instructed to follow-up with hand surgery.  Placed in finger splint.  No other injuries.  Fall appears to be mechanical from his Parkinson's.  Discharge home with return  precautions.  Final Clinical Impressions(s) / ED Diagnoses   Final diagnoses:  Fall, initial encounter  Laceration of left ring finger without foreign body without damage to nail, initial encounter  Dislocation of finger, initial encounter    ED Discharge Orders        Ordered    cephALEXin (KEFLEX) 250 MG capsule  4 times daily     04/25/17 2029       Pricilla Loveless, MD 04/26/17 0002

## 2017-04-25 NOTE — ED Notes (Signed)
ED Provider at bedside. 

## 2017-04-25 NOTE — ED Notes (Signed)
Patient transported to X-ray 

## 2017-04-25 NOTE — ED Notes (Signed)
ED Provider at bedside sutureing 

## 2017-05-06 MED FILL — CARBIDOPA-LEVODOPA-ENTA 100: 25-100-200 | 30 days supply | Qty: 180 | Fill #3

## 2017-05-21 ENCOUNTER — Ambulatory Visit: Payer: Self-pay | Admitting: Neurology

## 2017-06-04 MED FILL — CARBIDOPA-LEVODOPA-ENTA 100: 25-100-200 | 30 days supply | Qty: 180 | Fill #4

## 2017-06-16 ENCOUNTER — Ambulatory Visit: Payer: PPO | Admitting: Podiatry

## 2017-06-16 ENCOUNTER — Encounter

## 2017-06-24 ENCOUNTER — Ambulatory Visit (INDEPENDENT_AMBULATORY_CARE_PROVIDER_SITE_OTHER): Payer: PPO | Admitting: Family Medicine

## 2017-06-24 VITALS — BP 104/68 | HR 70 | Temp 97.7°F

## 2017-06-24 DIAGNOSIS — R531 Weakness: Secondary | ICD-10-CM | POA: Diagnosis not present

## 2017-06-24 DIAGNOSIS — R296 Repeated falls: Secondary | ICD-10-CM | POA: Diagnosis not present

## 2017-06-24 DIAGNOSIS — R5383 Other fatigue: Secondary | ICD-10-CM

## 2017-06-24 DIAGNOSIS — G20 Parkinson's disease: Secondary | ICD-10-CM | POA: Diagnosis not present

## 2017-06-24 NOTE — Progress Notes (Signed)
Subjective:     Patient ID: [name], male   DOB: Nov 10, 1940, 77 y.o.   MRN: [id number]  HPI  Patient is seen today accompanied by wife. He has history of morbid obesity and Parkinson's disease. He states he was diagnosed with Parkinson's about 20 years ago. He is currently on Stalevo but apparently has skipped some doses intermittently-recently.    He's had more frequent falls this year and they state he had about 4 falls on Monday. Denies any specific injury. Generalized weakness especially over the past year. Much less active. Ambulates with walker. They have someone coming into the home and assisting him 5 hours 4 days per week. His wife works full-time as a Engineer, civil (consulting) with nephrology.  No reported orthostatic dizziness.  He does not taking medications other than Stalevo. He has occasional dry cough. No recent fever.  Wife states he's had some gradual cognitive decline over time. Does not get out much and transfers are becoming more and more difficult.  Both he and his wife have goal of keeping him in the home as long as possible  In looking over previous records he had TSH slightly elevated at 4.9 back in 2016. No follow-up since then.  Past Medical History:  Diagnosis Date  . Fall   . Hypothyroidism   . Morbid obesity (HCC) 07/14/2012  . Obesity   . Parkinson disease (HCC)   . UTI (urinary tract infection)   . Venous stasis    edema   Past Surgical History:  Procedure Laterality Date  . anterior cervical discectomy C6-7    . APPENDECTOMY    . CERVICAL LAMINECTOMY    . FOOT SURGERY  1992   right foot repair    reports that he quit smoking about 25 years ago. His smoking use included cigarettes. He quit after 2.00 years of use. He has never used smokeless tobacco. He reports that he does not drink alcohol or use drugs. family history includes Alcohol abuse in his other; Cancer in his sister; Colon cancer in his father; Heart disease in his mother; Hyperlipidemia in his  mother. No Known Allergies  Review of Systems  Constitutional: Positive for fatigue. Negative for appetite change, fever and unexpected weight change.  Respiratory: Positive for cough. Negative for shortness of breath and wheezing.   Cardiovascular: Negative for chest pain and leg swelling.  Gastrointestinal: Negative for abdominal pain.  Genitourinary: Negative for dysuria.  Neurological: Negative for seizures, syncope and headaches.       Objective:   Physical Exam  Constitutional: He is oriented to person, place, and time. He appears well-developed and well-nourished.  HENT:  Mouth/Throat: Oropharynx is clear and moist.  Cardiovascular: Normal rate and regular rhythm.  Pulmonary/Chest: Effort normal and breath sounds normal. No respiratory distress. He has no wheezes. He has no rales.  Neurological: He is alert and oriented to person, place, and time. No cranial nerve deficit.  sitting in a wheelchair. We did not attempt to get him up to ambulate.       Assessment:     #1 Parkinson's disease with progressive decline physically and cognitively  #2 history of frequent falls.  #3 generalized weakness.    Plan:     -Check further labs with CBC, TSH, free T4 -Recommend home physical therapy for strengthening exercises and also help with transfers. May need OT consult as well -We've recommend a consider palliative care consult to help with goals of care and they agreed they would like to  pursue that -Continue close follow-up with neurology.  Kristian Covey MD [facility] at [facility]

## 2017-06-24 NOTE — Patient Instructions (Signed)
We will set up physical therapy and Palliative Care consults.

## 2017-06-25 LAB — CBC WITH DIFFERENTIAL/PLATELET
Basophils Absolute: 0 10*3/uL (ref 0.0–0.1)
Basophils Relative: 0.9 % (ref 0.0–3.0)
EOS PCT: 1.2 % (ref 0.0–5.0)
Eosinophils Absolute: 0.1 10*3/uL (ref 0.0–0.7)
HCT: 41.1 % (ref 39.0–52.0)
HEMOGLOBIN: 14 g/dL (ref 13.0–17.0)
LYMPHS ABS: 1.5 10*3/uL (ref 0.7–4.0)
LYMPHS PCT: 29 % (ref 12.0–46.0)
MCHC: 34.1 g/dL (ref 30.0–36.0)
MCV: 88.8 fl (ref 78.0–100.0)
Monocytes Absolute: 0.6 10*3/uL (ref 0.1–1.0)
Monocytes Relative: 11.7 % (ref 3.0–12.0)
Neutro Abs: 3.1 10*3/uL (ref 1.4–7.7)
Neutrophils Relative %: 57.2 % (ref 43.0–77.0)
Platelets: 174 10*3/uL (ref 150.0–400.0)
RBC: 4.63 Mil/uL (ref 4.22–5.81)
RDW: 13.7 % (ref 11.5–15.5)
WBC: 5.3 10*3/uL (ref 4.0–10.5)

## 2017-06-25 LAB — TSH: TSH: 4.24 u[IU]/mL (ref 0.35–4.50)

## 2017-06-25 LAB — T4, FREE: Free T4: 0.76 ng/dL (ref 0.60–1.60)

## 2017-06-27 DIAGNOSIS — I6789 Other cerebrovascular disease: Secondary | ICD-10-CM | POA: Diagnosis not present

## 2017-06-27 DIAGNOSIS — R4781 Slurred speech: Secondary | ICD-10-CM | POA: Diagnosis not present

## 2017-06-28 ENCOUNTER — Emergency Department (HOSPITAL_BASED_OUTPATIENT_CLINIC_OR_DEPARTMENT_OTHER): Payer: PPO

## 2017-06-28 ENCOUNTER — Emergency Department (HOSPITAL_BASED_OUTPATIENT_CLINIC_OR_DEPARTMENT_OTHER)
Admission: EM | Admit: 2017-06-28 | Discharge: 2017-06-28 | Disposition: A | Discharge status: Home / Self Care | Payer: PPO | Attending: Emergency Medicine | Admitting: Emergency Medicine

## 2017-06-28 ENCOUNTER — Other Ambulatory Visit: Payer: Self-pay

## 2017-06-28 DIAGNOSIS — R531 Weakness: Secondary | ICD-10-CM | POA: Diagnosis not present

## 2017-06-28 DIAGNOSIS — R296 Repeated falls: Secondary | ICD-10-CM | POA: Diagnosis not present

## 2017-06-28 DIAGNOSIS — R0602 Shortness of breath: Secondary | ICD-10-CM | POA: Insufficient documentation

## 2017-06-28 DIAGNOSIS — G20 Parkinson's disease: Secondary | ICD-10-CM | POA: Insufficient documentation

## 2017-06-28 DIAGNOSIS — R05 Cough: Secondary | ICD-10-CM | POA: Diagnosis not present

## 2017-06-28 DIAGNOSIS — S199XXA Unspecified injury of neck, initial encounter: Secondary | ICD-10-CM | POA: Diagnosis not present

## 2017-06-28 DIAGNOSIS — Z87891 Personal history of nicotine dependence: Secondary | ICD-10-CM | POA: Insufficient documentation

## 2017-06-28 DIAGNOSIS — Z79899 Other long term (current) drug therapy: Secondary | ICD-10-CM | POA: Diagnosis not present

## 2017-06-28 DIAGNOSIS — E039 Hypothyroidism, unspecified: Secondary | ICD-10-CM | POA: Insufficient documentation

## 2017-06-28 DIAGNOSIS — S0003XA Contusion of scalp, initial encounter: Secondary | ICD-10-CM | POA: Diagnosis not present

## 2017-06-28 LAB — CBG MONITORING, ED: GLUCOSE-CAPILLARY: 94 mg/dL (ref 65–99)

## 2017-06-28 LAB — URINALYSIS, ROUTINE W REFLEX MICROSCOPIC
Bilirubin Urine: NEGATIVE
Glucose, UA: NEGATIVE mg/dL
KETONES UR: 15 mg/dL — AB
LEUKOCYTES UA: NEGATIVE
NITRITE: NEGATIVE
PH: 6 (ref 5.0–8.0)
Protein, ur: NEGATIVE mg/dL
Specific Gravity, Urine: >1.03 — ABNORMAL HIGH (ref 1.005–1.030)

## 2017-06-28 LAB — BASIC METABOLIC PANEL
ANION GAP: 7 (ref 5–15)
BUN: 29 mg/dL — AB (ref 6–20)
CO2: 25 mmol/L (ref 22–32)
Calcium: 8.3 mg/dL — ABNORMAL LOW (ref 8.9–10.3)
Chloride: 107 mmol/L (ref 101–111)
Creatinine, Ser: 0.9 mg/dL (ref 0.61–1.24)
GFR calc Af Amer: >60 mL/min (ref >60)
Glucose, Bld: 104 mg/dL — ABNORMAL HIGH (ref 65–99)
POTASSIUM: 4.1 mmol/L (ref 3.5–5.1)
SODIUM: 139 mmol/L (ref 135–145)

## 2017-06-28 LAB — URINALYSIS, MICROSCOPIC (REFLEX): Bacteria, UA: NONE SEEN

## 2017-06-28 LAB — CBC
HEMATOCRIT: 39.3 % (ref 39.0–52.0)
Hemoglobin: 13.5 g/dL (ref 13.0–17.0)
MCH: 30.3 pg (ref 26.0–34.0)
MCHC: 34.4 g/dL (ref 30.0–36.0)
MCV: 88.3 fL (ref 78.0–100.0)
Platelets: 164 10*3/uL (ref 150–400)
RBC: 4.45 MIL/uL (ref 4.22–5.81)
RDW: 13.6 % (ref 11.5–15.5)
WBC: 5.4 10*3/uL (ref 4.0–10.5)

## 2017-06-28 NOTE — ED Notes (Signed)
Patient transported to CT 

## 2017-06-28 NOTE — Discharge Instructions (Addendum)
Please schedule close follow-up with your primary care physician and your neurologist.  Your vital signs, EKG, labs, imaging, urine showed no significant acute abnormality today.  There was a small amount of blood in your urine but no other significant sign of infection.  Urine culture has been sent.  You will be contacted if any bacteria grows and you need to be started on antibiotics.  If your symptoms worsen or you develop new symptoms concerning to you, please return to the emergency department.

## 2017-06-28 NOTE — ED Notes (Signed)
ED Provider at bedside. 

## 2017-06-28 NOTE — ED Provider Notes (Signed)
TIME SEEN: 1:09 AM  CHIEF COMPLAINT: Generalized weakness, frequent falls  HPI: Patient is a 77 year old male with history of Parkinson's disease, frequent urinary tract infections who presents to the emergency department with generalized weakness and frequent falls over the past week.  Patient brought in by EMS.  He presents with his wife who is a Publishing rights manager.  She provides most of the history.  She states that patient has had a history of frequent falls and frequent urinary tract infections but last UTI was a year ago.  He has history of Parkinson's and is on levodopa and is followed by [facility] neurology.  No changes in his medications recently she states normally he is pretty well controlled.  He does use a walker at baseline.  She states that he has been falling intermittently at home more than usual.  At times these falls have been witnessed and it appears as if he is just very weak.  States he has had times where he just slides down to the ground and other times when he loses his balance.  Patient reports at times he has had his head.  Denies neck or back pain.  Not on blood thinners.  Patient denies any numbness, tingling or focal weakness.  He denies any fevers, chest pain or chest discomfort, vomiting or diarrhea.  Has had productive cough for a long period of time which wife thinks is related to reflux.  He is on Zantac with some relief.  She also reports he has had some intermittent shortness of breath but she feels like he has this secondary to anxiety about his falls because she states he is very concerned that he may be placed in a skilled nursing facility.  He denies shortness of breath.  She reports that she does have cameras at home and there is someone with him at home 24/7.  She states they did see their primary care physician Dr. Caryl Never this week and they did not find any acute abnormality.  ROS: See HPI Constitutional: no fever  Eyes: no drainage  ENT: no runny nose    Cardiovascular:  no chest pain  Resp: no SOB  GI: no vomiting GU: no dysuria Integumentary: no rash  Allergy: no hives  Musculoskeletal: no leg swelling  Neurological: no slurred speech ROS otherwise negative  PAST MEDICAL HISTORY/PAST SURGICAL HISTORY:  Past Medical History:  Diagnosis Date  . Fall   . Hypothyroidism   . Morbid obesity (HCC) 07/14/2012  . Obesity   . Parkinson disease (HCC)   . UTI (urinary tract infection)   . Venous stasis    edema    MEDICATIONS:  Prior to Admission medications   Medication Sig Start Date End Date Taking? Authorizing Provider  carbidopa-levodopa-entacapone (STALEVO) 25-100-200 MG tablet Take 1 tablet by mouth 6 (six) times daily. At 6, 9, 12, 3 PM, 6 PM, and 9 PM 11/20/16   Huston Foley, MD  Guaifenesin 1200 MG TB12 Take 1 tablet (1,200 mg total) by mouth 2 (two) times daily. 09/22/16   Lawyer, Cristal Deer, PA-C    ALLERGIES:  No Known Allergies  SOCIAL HISTORY:  Social History   Tobacco Use  . Smoking status: Former Smoker    Years: 2.00    Types: Cigarettes    Last attempt to quit: 03/26/1992    Years since quitting: 25.2  . Smokeless tobacco: Never Used  Substance Use Topics  . Alcohol use: No    Alcohol/week: 0.0 oz    FAMILY HISTORY: Family  History  Problem Relation Age of Onset  . Heart disease Mother   . Hyperlipidemia Mother   . Colon cancer Father   . Alcohol abuse Other   . Cancer Sister        breast     EXAM: BP 124/65 (BP Location: Right Arm)   Pulse 63   Temp 97.6 F (36.4 C) (Oral)   Resp 16   SpO2 100%  CONSTITUTIONAL: Alert and oriented and responds appropriately to questions.  Elderly, chronically ill-appearing, in no distress, slow to answer questions but does so appropriately HEAD: Normocephalic; atraumatic EYES: Conjunctivae clear, PERRL, EOMI ENT: normal nose; no rhinorrhea; moist mucous membranes; pharynx without lesions noted; no dental injury; no septal hematoma; No pharyngeal erythema or  petechiae, no tonsillar hypertrophy or exudate, no uvular deviation, no unilateral swelling, no trismus or drooling, no muffled voice, normal phonation, no stridor, no dental caries present, no drainable dental abscess noted, no Ludwig's angina, tongue sits flat in the bottom of the mouth, no angioedema, no facial erythema or warmth, no facial swelling; no pain with movement of the neck. NECK: Supple, no meningismus, no LAD; no midline spinal tenderness, step-off or deformity; trachea midline CARD: RRR; S1 and S2 appreciated; no murmurs, no clicks, no rubs, no gallops RESP: Normal chest excursion without splinting or tachypnea; breath sounds clear and equal bilaterally; no wheezes, no rhonchi, no rales; no hypoxia or respiratory distress CHEST:  chest wall stable, no crepitus or ecchymosis or deformity, nontender to palpation; no flail chest ABD/GI: Normal bowel sounds; non-distended; soft, non-tender, no rebound, no guarding; no ecchymosis or other lesions noted PELVIS:  stable, nontender to palpation BACK:  The back appears normal and is non-tender to palpation, there is no CVA tenderness; no midline spinal tenderness, step-off or deformity EXT: Normal ROM in all joints; non-tender to palpation; no edema; normal capillary refill; no cyanosis, no bony tenderness or bony deformity of patient's extremities, no joint effusion, compartments are soft, extremities are warm and well-perfused, he does have some areas of deep tissue bruising to his bilateral upper extremities that appear old with some healing abrasions, 2+ DP and radial pulses bilaterally SKIN: Normal color for age and race; warm NEURO: Moves all extremities equally; strength 5/5 in all 4 extremities, cranial nerves II through XII intact, normal speech, no tremors, sensation to light touch intact diffusely PSYCH: The patient's mood and manner are appropriate. Grooming and personal hygiene are appropriate.  MEDICAL DECISION MAKING: Patient here  with frequent falls over the past week.  He does have a history of the same but wife feels like this is been worse.  Could be related to his Parkinson's disease but she is concerned that something else could be going on and I agree.  Will check labs to ensure there is no electrolyte abnormality, anemia, dehydration.  Will check chest x-ray and urine to evaluate for any sign of infection.  Will obtain CT of his head and cervical spine given his age and recent head injury.  No other sign of traumatic injury on exam.  EKG shows no ischemic abnormality, arrhythmia, interval changes.  ED PROGRESS: Patient's work-up shows no acute abnormality.  His urine does show small ketones and high specific gravity but at this time I do not feel he needs IV hydration I recommend increase oral fluids and wife agrees.  Urine does show 6-10 red blood cells but no bacteria.  We will send urine culture given his history of frequent UTIs but I do  not feel he needs antibiotics at this time.  Wife also comfortable with this.  Labs are normal today including normal electrolytes, glucose, hemoglobin.  CT of the head, cervical spine and chest x-ray showed no acute abnormality.  Wife is very reassured by these findings and I have provided her with a copy of these results.  I have offered them admission to the hospital given his increasing generalized weakness and frequent falls.  She feels comfortable with taking the patient home and has caregivers with him at all times.  They will plan to follow-up closely with patient's primary care physician as well as his neurologist.  We discussed at length return precautions.  Nothing at this time to suggest stroke as patient has no focal neurologic deficits.   At this time, I do not feel there is any life-threatening condition present. I have reviewed and discussed all results (EKG, imaging, lab, urine as appropriate) and exam findings with patient/family. I have reviewed nursing notes and  appropriate previous records.  I feel the patient is safe to be discharged home without further emergent workup and can continue workup as an outpatient as needed. Discussed usual and customary return precautions. Patient/family verbalize understanding and are comfortable with this plan.  Outpatient follow-up has been provided if needed. All questions have been answered.      EKG Interpretation  Date/Time:  Sunday Jun 28 2017 00:44:16 EDT Ventricular Rate:  62 PR Interval:    QRS Duration: 97 QT Interval:  428 QTC Calculation: 435 R Axis:   38 Text Interpretation:  Sinus rhythm Low voltage, extremity and precordial leads Abnormal R-wave progression, early transition No significant change since last tracing Confirmed by Amani Marseille, Baxter Hire [phone]) on 06/28/2017 1:07:20 AM         Brennley Curtice, Layla Maw, DO 06/28/17 6045

## 2017-06-28 NOTE — ED Triage Notes (Addendum)
BIB EMS from home, w/ pts wife reporting increasing weakness and fall tonight. No obvious deformity or injury observed. Pt has hx of chronic UTI. Pt denies pain upon arrival. Pt A+OX4, NAD.   EMS Vitals BP 124/64 SPO2 97% CBG 104

## 2017-06-29 LAB — URINE CULTURE

## 2017-06-30 DIAGNOSIS — I872 Venous insufficiency (chronic) (peripheral): Secondary | ICD-10-CM | POA: Diagnosis not present

## 2017-06-30 DIAGNOSIS — G20 Parkinson's disease: Secondary | ICD-10-CM | POA: Diagnosis not present

## 2017-06-30 DIAGNOSIS — Z87891 Personal history of nicotine dependence: Secondary | ICD-10-CM | POA: Diagnosis not present

## 2017-06-30 DIAGNOSIS — Z9181 History of falling: Secondary | ICD-10-CM | POA: Diagnosis not present

## 2017-06-30 DIAGNOSIS — E039 Hypothyroidism, unspecified: Secondary | ICD-10-CM | POA: Diagnosis not present

## 2017-07-03 ENCOUNTER — Telehealth: Payer: Self-pay | Admitting: Neurology

## 2017-07-03 NOTE — Telephone Encounter (Signed)
I called pt's wife, explained that Dr. Frances Furbish does not feel comfortable increasing pt's stalevo without an office visit. I offered her an appt for pt at 8:30am, check in at 8:00am, on 07/06/17. [name] wife is agreeable to this appt.

## 2017-07-03 NOTE — Telephone Encounter (Signed)
I called pt's wife, [place] Sink, per DPR, and explained Dr. Teofilo Pod recommendations to her. I offered pt's wife several appts next week, but pt's wife cannot make any of these appts because they are not late enough in the afternoon. She is tentatively scheduled for 09/01/17 at 8:30am. She is asking for an increase in stalevo without pt coming in for an appt. She is very worried about him falling and hitting his head. She reminded me that she is an NP, and a hypertensive specialist, and is aware of pt's BP. She reports that pt is struggling with incontinence and is worried about drinking too much water, but that is something they are working on with the pt is staying adequately hydrated. [name] wife asked me to keep pt on the cancellation list for the last appt of the day but also speak with Dr. Frances Furbish about increasing his stalevo in the meantime.   Of note, pt's wife started pt on omeprazole, which has controlled his "reflux cough" and does not want pt to complete a swallow study at this time.

## 2017-07-03 NOTE — Telephone Encounter (Signed)
Please advise wife or patient that I would like to reassess him first before changing medications. Of note, his blood pressure has been on the lower side, increasing Stalevo can result in further blood pressure reduction. Falls can result from low blood pressure/fainting as well. Please make sure he is drinking enough water? 6-8 glasses per day unless fluid restriction has been recommended by medical doctor or any other specialist.

## 2017-07-03 NOTE — Telephone Encounter (Signed)
I do not feel comfortable increasing the Stalevo before his visit for several reasons:   1. Increasing levodopa rarely improves fall risk in advanced Parkinson's patients.  2. Unfortunately, his fall risk is likely due to multiple factors, including advanced Parkinson's disease, likely suboptimal hydration, prior numerous falls with injuries, low blood pressure etc. 3. He has, in the past had a tendency to self-increase or self medicate with the levodopa.  4. He has developed dyskinesias which are involuntary abnormal movements and typically secondary to taken levodopa, increasing the Stalevo will likely increase the dyskinesias, increasing his fall risk in turn.  5. My recommendation at this point is to consider a second opinion or transfer, (whichever more feasible or desired by pt and wife) with a movement disorder specialist at the academic centers,[facility] or [place] or [facility] for help with management.   Please advise wife or patient. thx

## 2017-07-03 NOTE — Telephone Encounter (Signed)
Pt wife(on DPR) has called re: pt's      carbidopa-levodopa-entacapone (STALEVO) 25-100-200 MG tablet      She states pt has been on this current dosage for years and she would like to inquire about increasing it as a result of multiple falls and 2 ED visits within the past 2 months.  Please call

## 2017-07-06 ENCOUNTER — Encounter: Payer: Self-pay | Admitting: Neurology

## 2017-07-06 ENCOUNTER — Ambulatory Visit (INDEPENDENT_AMBULATORY_CARE_PROVIDER_SITE_OTHER): Payer: PPO | Admitting: Neurology

## 2017-07-06 VITALS — BP 108/68 | HR 72

## 2017-07-06 DIAGNOSIS — N3942 Incontinence without sensory awareness: Secondary | ICD-10-CM | POA: Diagnosis not present

## 2017-07-06 DIAGNOSIS — G20 Parkinson's disease: Secondary | ICD-10-CM | POA: Diagnosis not present

## 2017-07-06 DIAGNOSIS — R296 Repeated falls: Secondary | ICD-10-CM | POA: Diagnosis not present

## 2017-07-06 MED ORDER — ESCITALOPRAM OXALATE 10 MG PO TABS: 5.0000 mg | ORAL_TABLET | Freq: Every day | ORAL | 5 refills | Status: DC

## 2017-07-06 MED ORDER — CARBIDOPA-LEVODOPA-ENTACAPONE 25-100-200 MG PO TABS: ORAL_TABLET | ORAL | 3 refills | Status: AC

## 2017-07-06 MED FILL — CARBIDOPA-LEVODOPA-ENTA 100: 25-100-200 | 90 days supply | Qty: 630 | Fill #0

## 2017-07-06 NOTE — Progress Notes (Signed)
Subjective:    Patient ID: [name] is a 77 y.o. male.  HPI     Interim history:   [name] is a very pleasant 77 year old left-handed gentleman with an underlying medical history of degenerative disc disease, degenerative neck disease with status post neck surgery, history of syncope in the past, overweight state, reflux disease, and recurrent UTIs, who presents for follow-up consultation of his advanced right-sided predominant Parkinson's disease dating back to 4782, complicated by dyskinesias, recurrent falls, recurrent UTI, prior medication intolerances, and overall decondition and challenges associated with advancing age and advancing Parkinson's disease. The patient is accompanied by his wife today and was not able to keep his appointment in April 2019. I last saw him on 11/20/2016 after a gap of over 1 year, at which time he reported more issues with his balance and more falls. He was mostly confined to wheelchair outside of the home and was using his walker inside the house. They changed from carpet to hardwood floor because it was difficult for him to maneuver the walker over carpet. Nevertheless, he had issues with recurrent cough and recurrent falls at time. We talked about the safety and the importance of fall risk management at the time. I did not suggest increase in his medication regimen at the time.  Today, 07/06/2017 (all dictated new, as well as above notes, some dictation done in note pad or Word, outside of chart, may appear as copied):    He reports Very little today. His wife reports that his fall frequency has increased drastically in the past couple of months. He has fallen despite people around him. He does not always have a recent fall, sometimes he bends over too much. He does not like to drink water, averages about 2 bottles per day per wife's report and his struggling with his urinary incontinence. He is not able to really use a urinal. Of note, he had an emergency  room visit on 04/25/2017 after he fell pushing a cart. He had an emergency room visit after fall on 06/28/2017 and presented with generalized weakness and fall the same day. Urinalysis and BMP suggested mild dehydration but he was not given IV fluids, advised to increase oral fluid intake. His urine culture showed no species present and recollection was advised.  He had a head and neck CT without contrast on 06/28/2017 and I reviewed the results: IMPRESSION: CT HEAD:   1. No acute intracranial process.  Multiple small scalp hematomas. 2. Stable moderate parenchymal brain volume loss and moderate chronic small vessel ischemic disease.   CT CERVICAL SPINE:   1. No acute fracture. Grade 1 C3-4 anterolisthesis on degenerative basis. 2. Moderate canal stenosis C3-4, C5-6 and C6-7. Multilevel severe neural foraminal narrowing.   The patient's allergies, current medications, family history, past medical history, past social history, past surgical history and problem list were reviewed and updated as appropriate.    Previously (copied from previous notes for reference):   I last saw him on 09/17/2015, at which time he reported more issues with his balance and tendency to fall. He felt worse in the afternoon or early evening. He had more fatigue and weakness. He was trying to go for exercise under a trainer 3 times a week. He recently lost his sister from cancer. He had presented to the emergency room in January after a fall. He also sustained a fall and injured his right distal leg requiring stitches. He presented to the emergency room in July after a  fall. He had a head CT without contrast on 08/24/2015, which I reviewed: IMPRESSION: Mild atrophy with patchy periventricular small vessel disease, stable. No intracranial mass, hemorrhage, or extra-axial fluid collection. No acute infarct evident. There are foci of arterial vascular calcification. There is leftward deviation of the nasal septum. There  is probable cerumen in each external auditory canal.    Of note, he missed an appointment on 08/01/2015. I saw him on 12/04/2014, at which time he reported doing reasonably well, no falls since June 2016, was using his walker more consistently, was trying to drink enough water. His wife was working full-time as a Designer, jewellery. He had a male aide come in twice a week for about 6 hours to help him with his laundry, showering and other chores. I suggested he continue with his current medication regimen. He no showed for appointment in February 2017.   I saw him on 05/22/2014, at which time he reported no recent falls. He was using his cane usually after 5 PM when he would become more dyskinetic. He did admit to avoid driving at [place] and [place]. He felt that symptoms were stable. He was trying to go to the gym regularly for exercise. In the interim, he presented to the emergency room and was hospitalized for a day on 08/10/2014 2 08/11/2014 secondary to left leg weakness and recurrent falls and progressive decline. He had workup with a head CT and MRI which I reviewed: CT head without contrast on 08/10/2014 showed:  No acute intracranial pathology. Mild age-related atrophy and chronic microvascular ischemic disease. If symptoms persist and there are no contraindications, MRI may provide better evaluation if clinically indicated.     MRI brain without contrast on 08/10/2014 showed:  1. No acute intracranial abnormality. Mild for age nonspecific cerebral white matter signal changes. 2. Chronic cervical spinal stenosis, that seen at C3-C4 appears grossly stable since 2011. In addition, I personally reviewed the images through the PACS system. I agree with the findings.   Left hip x-ray on 08/10/2014 was negative. Left hip MRI on 08/10/2014 showed: No acute abnormality.  Negative for fracture.   Small amount of fluid in the left trochanteric bursa compatible with bursitis.   I saw him on  10/26/2013, at which time his wife reported a fall in July. She was supposed to start a new job and was worried about leaving him alone during the day and requested a letter from me on behalf of his [name], [name], to help him transfer through [place] in [place], [name] to [place], [name], so he could stay with them. I kept him on Stalevo. I restarted him on Azilect as an adjunct. Of note, he canceled an appointment with me on 03/07/2014.    I saw him on 01/11/13, at which time I counseled him regarding staying within the prescribed amount of Stalevo. I kept him on Stalevo 6 times a day. I suggested adding Azilect. We talked about his prior diagnosis of obstructive sleep apnea in 2008 and I suggested he be retested for sleep apnea and ordered a sleep study. However, he did not come back for the sleep study, as he did not feel like he wanted to do it.  Of note, he no showed for an appointment on 07/12/2013, as he did not have a ride.     He fell on 09/02/13 and hit his head and went to the ER, where he had [place], which I reviewed: 1. No acute intracranial abnormality. 2.  Stable moderate generalized atrophy and mild to moderate chronic microvascular ischemic changes of the white matter. He stopped the Azilect, as he did not feel better.       I first met him on 07/14/2012, at which time I felt he was stable and it did not make any medication changes. He requested a letter to excuse him from jury duty which I provided. In the interim, I was notified that he was trying to fill his Stalevo prescription earlier than scheduled and perhaps overusing Stalevo. The pharmacy note from [facility] pharmacy stated that he presented there demanding a refill and threatening to go to the emergency room if he would not get his prescription.     He previously followed with Dr. Morene Antu and was last seen by him on 02/25/2012, at which time Dr. Erling Cruz felt that the patient was doing well. He was exercising regularly.   He was  diagnosed with right-sided predominant Parkinson's disease in May 1996 by Dr. Morrell Riddle. He was on selegiline 5 mg in the beginning and in 1999 he was started on amantadine. He discontinued his these medications and started Mirapex but had side effects and discontinued it after a month. He started seeing Dr. Erling Cruz in June 2000 and was re-started on selegiline and amantadine. He was then started on Permax. This was changed and 2005 to Stalevo and Requip was added but discontinued because of excessive daytime somnolence. He has a history of obesity and lost 60 pounds with an exercise program. He has had some memory loss. He had neck surgery in 1996. A sleep study in April 2008 showed hypopneas. In January of this year his MMSE was 29, clock drawing was 4, animal fluency was 13.   He goes to [employer] 3 times a week on M/W/F. He lives with his wife in his own 2 storey home. His bedroom is downstairs. He snores and therefore, sleeps in a separate bedroom. His wife is in school to become a NP. He has fallen in the past.   I reviewed his sleep study from 05/27/06: AHI was 10.1, based on 62 hypopneas and his REM AHI 27.1/h, his O2 nadir was 78% and he spent 17.6 minutes below the saturation of 88%. It was recommended that he come back for CPAP titration but that never came to fruition for one reason or another. He stated that he was told he did not have to be treated with CPAP.   His Past Medical History Is Significant For: Past Medical History:  Diagnosis Date  . Fall   . Hypothyroidism   . Morbid obesity ([place]) 07/14/2012  . Obesity   . Parkinson disease ([place])   . UTI (urinary tract infection)   . Venous stasis    edema    His Past Surgical History Is Significant For: Past Surgical History:  Procedure Laterality Date  . anterior cervical discectomy C6-7    . APPENDECTOMY    . CERVICAL LAMINECTOMY    . FOOT SURGERY  1992   right foot repair    His Family History Is Significant For: Family History  Problem  Relation Age of Onset  . Heart disease Mother   . Hyperlipidemia Mother   . Colon cancer Father   . Alcohol abuse Other   . Cancer Sister        breast     His Social History Is Significant For: Social History   Socioeconomic History  . Marital status: Married    Spouse name: Velva Harman  .  Number of children: 3  . Years of education: college  . Highest education level: Not on file  Occupational History    Employer: RETIRED  Social Needs  . Financial resource strain: Not on file  . Food insecurity:    Worry: Not on file    Inability: Not on file  . Transportation needs:    Medical: Not on file    Non-medical: Not on file  Tobacco Use  . Smoking status: Former Smoker    Years: 2.00    Types: Cigarettes    Last attempt to quit: 03/26/1992    Years since quitting: 25.2  . Smokeless tobacco: Never Used  Substance and Sexual Activity  . Alcohol use: No    Alcohol/week: 0.0 oz  . Drug use: No  . Sexual activity: Not on file  Lifestyle  . Physical activity:    Days per week: Not on file    Minutes per session: Not on file  . Stress: Not on file  Relationships  . Social connections:    Talks on phone: Not on file    Gets together: Not on file    Attends religious service: Not on file    Active member of club or organization: Not on file    Attends meetings of clubs or organizations: Not on file    Relationship status: Not on file  Other Topics Concern  . Not on file  Social History Narrative   Regular exercise-No   Retired,is left handed and resides with wife    His Allergies Are:  No Known Allergies:   His Current Medications Are:  Outpatient Encounter Medications as of 07/06/2017  Medication Sig  . carbidopa-levodopa-entacapone (STALEVO) 25-100-200 MG tablet Take 1 tablet by mouth 6 (six) times daily. At 6, 9, 12, 3 PM, 6 PM, and 9 PM  . Guaifenesin 1200 MG TB12 Take 1 tablet (1,200 mg total) by mouth 2 (two) times daily.  Marland Kitchen omeprazole (PRILOSEC) 20 MG capsule Take  20 mg by mouth daily.   No facility-administered encounter medications on file as of 07/06/2017.   :  Review of Systems:  Out of a complete 14 point review of systems, all are reviewed and negative with the exception of these symptoms as listed below: Review of Systems  Neurological:       Pt presents today to discuss his falls. Pt has had multiple falls recently. [name] wife is asking if the stalevo can be increased.    Objective:  Neurological Exam  Physical Exam Physical Examination:   Vitals:   07/06/17 0824  BP: 108/68  Pulse: 72    General Examination: The patient is a very pleasant 77 y.o. male in no acute distress. He appears frail and deconditioned, minimally verbal, and wheelchair.   HEENT:Normocephalic, atraumatic, pupils are equal, round and reactive to light and accommodation. Extraocular tracking shows moderate saccadic breakdown without nystagmus noted. There is limitation to upper gaze. There is decrease in eye blink rate. Hearing is grossly intact, he has moderate facial masking, mouth stays open most of the time, no significant sialorrhea. He has minimal head and neck dyskinesias. Neck is moderately to severely rigid. Airway examination reveals no new findings.  Chest:is clear to auscultation without wheezing, rhonchi or crackles noted.  Heart:sounds are regular and normal without murmurs, rubs or gallops noted.   Abdomen:is soft, non-tender and non-distended with normal bowel sounds appreciated on auscultation.  Extremities:There is trace pitting edema in the distal lower extremities bilaterally.   Skin:  is warm and dry with no trophic changes noted. Age-related changes are noted on the skin. Older bruises.   Musculoskeletal: exam reveals no obvious joint deformities, tenderness, joint swelling or erythema, mild right knee swelling noted.  Neurologically:   Mental status: The patient is awake and alert, paying goodattention. He is able to to add  minimal information to the history, wife provides almost all of his history. His memory, attention, language and knowledge are mildly impaired.he has bradyphrenia. Speech is moderately hypophonic with mild to moderate dysarthria noted. Mood is congruent and affect is normal. Raspy voice, cough.    Cranial nerves are as described above under HEENT exam. In addition, shoulder shrug is normal with unequal shoulder height noted.  Motor exam: thinner bulk, and global strength of about 4 out of 5. He has intermittent minimal dyskinesias, tone is increased in the moderate degree, right more so than left, severe increase in tone in the right ankle. He has moderate to severe bradykinesia. He has an intermittent resting tremor in the right hand only. Romberg is not testable safely. Reflexes are trace throughout. Fine motor skills exam: findings are moderate to severely impaired on the right more than left.  Cerebellar testing shows no dysmetria or intention tremor.   Sensory exam is intact to light touch in the upper and lower extremities.   Gait, station and balance: I did not have him stand or walk for me today for safety reasons.   Assessment and Plan:   In summary, [name] is a very pleasant 77 year old male with a history of advanced Parkinson's disease, right-sided predominant, complicated by obesity, dyskinesias, medication intolerances in the past and progressive difficulty with gait and balance and recurrent falls. He has had overall decline secondary to ageing and having advanced PD. Managing his PD has been challenging at times, due to delays in appointments due to no-shows and cancellations. In the past, he has tried amantadine, Permax, Mirapex, selegiline, and Requip. He has been reasonably stable for the past few years but his condition has progressed invariably and he has developed for dyskinesias, balance issues, recurrent falls, recurrent UTI. He is incontinent of urine. He had  recent emergency room visits. We talked about the challenges of advancing Parkinson's disease or recurrent falls. We talked about complicated interplay of dehydration, UTI, balance issues and falls. He is advised that he is no longer safe to use a walker on his own. He is advised that we may need to evaluate him for a motorized wheelchair. He may need placement in a long-term care facility and patient and his wife have talked about it. He would like to stay at home as long as possible, they do have caretakers at the house and he is getting physical therapy at home. His wife still works full-time as a Designer, jewellery. I would like to proceed with a referral to urology. I would like to suggest referral to a tertiary care center with respect to getting help for his advanced Parkinson's disease. His wife would like to go to [name] for this. I requested referral to Dr. Linus Mako. We mutually agreed to try a slightly higher dose of Stalevo but he is advised that it may not increase his mobility to the point where his balance is improved or his fall risk is less. Unfortunately, his balance issues and fall risk are a compilation of multiple issues. His wife reports increase in anxiety, we mutually agreed to try a low-dose SSRI, I suggested Lexapro generic  5 mg strength with increased to 10 mg after month. I suggested a four-month follow-up, sooner if needed. Answered all their questions today and the patient and his wife were in agreement. I spent 30 minutes in total face-to-face time with the patient, more than 50% of which was spent in counseling and coordination of care, reviewing test results, reviewing medication and discussing or reviewing the diagnosis of PD, its prognosis and treatment options. Pertinent laboratory and imaging test results that were available during this visit with the patient were reviewed by me and considered in my medical decision making (see chart for details).

## 2017-07-06 NOTE — Patient Instructions (Addendum)
I would like to increase the Stalevo to 7 pills a day.  Please remember, it can increase your dyskinesias, which was involuntary movements, drop your blood pressure and may not improve balance issues or your falls.   I will refer you to Urology.   I will refer you to Dr. Clemens Catholic at [facility].   You are no longer safe to use the walker.   We may need to evaluate you for a motorized wheel chair.   Please drink more water.

## 2017-07-07 NOTE — Telephone Encounter (Signed)
Sent to [place] With [facility] [phone] - fax [phone] . [facility] will call wife to schedule.

## 2017-07-19 ENCOUNTER — Encounter (HOSPITAL_BASED_OUTPATIENT_CLINIC_OR_DEPARTMENT_OTHER): Payer: Self-pay | Admitting: Emergency Medicine

## 2017-07-19 ENCOUNTER — Other Ambulatory Visit: Payer: Self-pay

## 2017-07-19 ENCOUNTER — Emergency Department (HOSPITAL_BASED_OUTPATIENT_CLINIC_OR_DEPARTMENT_OTHER): Payer: PPO

## 2017-07-19 ENCOUNTER — Emergency Department (HOSPITAL_BASED_OUTPATIENT_CLINIC_OR_DEPARTMENT_OTHER)
Admission: EM | Admit: 2017-07-19 | Discharge: 2017-07-19 | Disposition: A | Discharge status: Home / Self Care | Payer: PPO | Attending: Emergency Medicine | Admitting: Emergency Medicine

## 2017-07-19 DIAGNOSIS — W01198A Fall on same level from slipping, tripping and stumbling with subsequent striking against other object, initial encounter: Secondary | ICD-10-CM | POA: Insufficient documentation

## 2017-07-19 DIAGNOSIS — Z87891 Personal history of nicotine dependence: Secondary | ICD-10-CM | POA: Insufficient documentation

## 2017-07-19 DIAGNOSIS — S5001XA Contusion of right elbow, initial encounter: Secondary | ICD-10-CM | POA: Insufficient documentation

## 2017-07-19 DIAGNOSIS — Z79899 Other long term (current) drug therapy: Secondary | ICD-10-CM | POA: Diagnosis not present

## 2017-07-19 DIAGNOSIS — W19XXXA Unspecified fall, initial encounter: Secondary | ICD-10-CM

## 2017-07-19 DIAGNOSIS — Y92009 Unspecified place in unspecified non-institutional (private) residence as the place of occurrence of the external cause: Secondary | ICD-10-CM | POA: Diagnosis not present

## 2017-07-19 DIAGNOSIS — R296 Repeated falls: Secondary | ICD-10-CM | POA: Diagnosis not present

## 2017-07-19 DIAGNOSIS — Y998 Other external cause status: Secondary | ICD-10-CM | POA: Diagnosis not present

## 2017-07-19 DIAGNOSIS — E039 Hypothyroidism, unspecified: Secondary | ICD-10-CM | POA: Diagnosis not present

## 2017-07-19 DIAGNOSIS — M25521 Pain in right elbow: Secondary | ICD-10-CM | POA: Diagnosis not present

## 2017-07-19 DIAGNOSIS — S59901A Unspecified injury of right elbow, initial encounter: Secondary | ICD-10-CM | POA: Diagnosis not present

## 2017-07-19 DIAGNOSIS — G20 Parkinson's disease: Secondary | ICD-10-CM | POA: Insufficient documentation

## 2017-07-19 DIAGNOSIS — M7989 Other specified soft tissue disorders: Secondary | ICD-10-CM | POA: Diagnosis not present

## 2017-07-19 DIAGNOSIS — Y9389 Activity, other specified: Secondary | ICD-10-CM | POA: Diagnosis not present

## 2017-07-19 NOTE — ED Triage Notes (Addendum)
Pt was found face down in the floor in front of his walker. Pt c/o R elbow pain. Denies LOC. Pt states he tripped and fell. Pt was given motrin PTA

## 2017-07-19 NOTE — ED Provider Notes (Signed)
[facility] EMERGENCY DEPARTMENT Provider Note   CSN: [id number] Arrival date & time: 07/19/17  1843     History   Chief Complaint Chief Complaint  Patient presents with  . Fall    HPI [name] is a 77 y.o. male.  Patient is a 77 year old male with a history of Parkinson's who presents after a fall.  He states that he has frequent falls related to his Parkinson's.  He was trying to sit down in a chair and misjudged the distance and fell over onto his side.  His right arm was twisted.  He denies hitting his head.  He denies any neck or back pain.  He denies any other injuries besides his right elbow.  He noted some swelling to the elbow and came in to have it checked out.  He is not on anticoagulants.     Past Medical History:  Diagnosis Date  . Fall   . Hypothyroidism   . Morbid obesity (HCC) 07/14/2012  . Obesity   . Parkinson disease (HCC)   . UTI (urinary tract infection)   . Venous stasis    edema    Patient Active Problem List   Diagnosis Date Noted  . Recurrent falls 08/10/2014  . Falls 08/10/2014  . Weakness   . Parkinson disease (HCC)   . Morbid obesity (HCC) 07/14/2012  . WEAKNESS 01/30/2010  . URINARY INCONTINENCE 01/30/2010  . DYSURIA 11/13/2009  . PARKINSON'S DISEASE 01/29/2009    Past Surgical History:  Procedure Laterality Date  . anterior cervical discectomy C6-7    . APPENDECTOMY    . CERVICAL LAMINECTOMY    . FOOT SURGERY  1992   right foot repair        Home Medications    Prior to Admission medications   Medication Sig Start Date End Date Taking? Authorizing Provider  carbidopa-levodopa-entacapone (STALEVO) 25-100-200 MG tablet One pill 7 times a day, at 6, 9, 12, 3 PM, 6 PM, 9 PM and 11 PM. 07/06/17   Huston FoleyAthar, Saima, MD  escitalopram (LEXAPRO) 10 MG tablet Take 0.5 tablets (5 mg total) by mouth daily. Increase to 1 pill daily after one month. 07/06/17   Huston FoleyAthar, Saima, MD  Guaifenesin 1200 MG TB12 Take 1 tablet (1,200 mg  total) by mouth 2 (two) times daily. 09/22/16   Lawyer, Cristal Deerhristopher, PA-C  omeprazole (PRILOSEC) 20 MG capsule Take 20 mg by mouth daily.    [provider]    Family History Family History  Problem Relation Age of Onset  . Heart disease Mother   . Hyperlipidemia Mother   . Colon cancer Father   . Alcohol abuse Other   . Cancer Sister        breast     Social History Social History   Tobacco Use  . Smoking status: Former Smoker    Years: 2.00    Types: Cigarettes    Last attempt to quit: 03/26/1992    Years since quitting: 25.3  . Smokeless tobacco: Never Used  Substance Use Topics  . Alcohol use: No    Alcohol/week: 0.0 oz  . Drug use: No     Allergies   Patient has no known allergies.   Review of Systems Review of Systems  Constitutional: Negative for fever.  Gastrointestinal: Negative for nausea and vomiting.  Musculoskeletal: Positive for arthralgias and joint swelling. Negative for back pain and neck pain.  Skin: Negative for wound.  Neurological: Negative for weakness, numbness and headaches.  Physical Exam Updated Vital Signs BP 124/67 (BP Location: Left Arm)   Pulse 65   Temp 98 F (36.7 C) (Oral)   Resp 16   Ht 6\' 2"  (1.88 m)   Wt 106.6 kg (235 lb)   SpO2 99%   BMI 30.17 kg/m   Physical Exam  Constitutional: He is oriented to person, place, and time. He appears well-developed and well-nourished.  HENT:  Head: Normocephalic and atraumatic.  Neck: Normal range of motion. Neck supple.  No pain along the spine  Cardiovascular: Normal rate.  Pulmonary/Chest: Effort normal.  Musculoskeletal: He exhibits edema and tenderness.  Patient has some swelling over the lateral epicondyles of the right elbow.  There is some ecchymosis to this area.  There is tenderness on palpation of the elbow.  There is no pain to the shoulder wrist.  He has normal sensation and motor function of the hand.  Radial pulses are intact.  There is no other pain on  range of motion of the extremities  Neurological: He is alert and oriented to person, place, and time.  Skin: Skin is warm and dry.  Psychiatric: He has a normal mood and affect.     ED Treatments / Results  Labs (all labs ordered are listed, but only abnormal results are displayed) Labs Reviewed - No data to display  EKG None  Radiology Dg Elbow Complete Right  Result Date: 07/19/2017 CLINICAL DATA:  Recent fall with elbow pain and swelling, initial encounter EXAM: RIGHT ELBOW - COMPLETE 3+ VIEW COMPARISON:  None. FINDINGS: There is no evidence of fracture, dislocation, or joint effusion. There is no evidence of arthropathy or other focal bone abnormality. Soft tissues are unremarkable. IMPRESSION: No acute abnormality noted. Electronically Signed   By: Alcide Clever M.D.   On: 07/19/2017 19:50    Procedures Procedures (including critical care time)  Medications Ordered in ED Medications - No data to display   Initial Impression / Assessment and Plan / ED Course  I have reviewed the triage vital signs and the nursing notes.  Pertinent labs & imaging results that were available during my care of the patient were reviewed by me and considered in my medical decision making (see chart for details).     X-rays show no evidence of fracture or dislocation.  An Ace wrap was placed around the arm.  Patient use ibuprofen at home for symptomatic relief.  I advised and use ice to the area.  He will follow-up with his PCP if his symptoms are not improving.  Final Clinical Impressions(s) / ED Diagnoses   Final diagnoses:  Fall, initial encounter  Contusion of right elbow, initial encounter    ED Discharge Orders    None       Rolan Bucco, MD 07/19/17 2011

## 2017-07-24 ENCOUNTER — Telehealth: Payer: Self-pay | Admitting: Family Medicine

## 2017-07-24 NOTE — Telephone Encounter (Signed)
FYI only.

## 2017-07-24 NOTE — Telephone Encounter (Unsigned)
Copied from CRM [phone]#[id number]. Topic: Inquiry >> Jul 24, 2017 12:45 PM Crist InfanteHarrald, Kathy J wrote: Reason for CRM: mark , PT with [facility] called to advise pt was not feeling up to visit today, so missed today

## 2017-08-04 ENCOUNTER — Encounter (HOSPITAL_BASED_OUTPATIENT_CLINIC_OR_DEPARTMENT_OTHER): Payer: Self-pay | Admitting: Adult Health

## 2017-08-04 ENCOUNTER — Emergency Department (HOSPITAL_BASED_OUTPATIENT_CLINIC_OR_DEPARTMENT_OTHER)
Admission: EM | Admit: 2017-08-04 | Discharge: 2017-08-04 | Disposition: A | Discharge status: Home / Self Care | Payer: PPO | Attending: Emergency Medicine | Admitting: Emergency Medicine

## 2017-08-04 ENCOUNTER — Emergency Department (HOSPITAL_BASED_OUTPATIENT_CLINIC_OR_DEPARTMENT_OTHER): Payer: PPO

## 2017-08-04 ENCOUNTER — Other Ambulatory Visit: Payer: Self-pay

## 2017-08-04 DIAGNOSIS — M79604 Pain in right leg: Secondary | ICD-10-CM

## 2017-08-04 DIAGNOSIS — L03115 Cellulitis of right lower limb: Secondary | ICD-10-CM | POA: Diagnosis not present

## 2017-08-04 DIAGNOSIS — E039 Hypothyroidism, unspecified: Secondary | ICD-10-CM | POA: Insufficient documentation

## 2017-08-04 DIAGNOSIS — G20 Parkinson's disease: Secondary | ICD-10-CM | POA: Insufficient documentation

## 2017-08-04 DIAGNOSIS — Z79899 Other long term (current) drug therapy: Secondary | ICD-10-CM | POA: Insufficient documentation

## 2017-08-04 DIAGNOSIS — S8011XA Contusion of right lower leg, initial encounter: Secondary | ICD-10-CM | POA: Diagnosis not present

## 2017-08-04 DIAGNOSIS — Z87891 Personal history of nicotine dependence: Secondary | ICD-10-CM | POA: Diagnosis not present

## 2017-08-04 LAB — BASIC METABOLIC PANEL
Anion gap: 9 (ref 5–15)
BUN: 28 mg/dL — ABNORMAL HIGH (ref 6–20)
CO2: 26 mmol/L (ref 22–32)
Calcium: 8.6 mg/dL — ABNORMAL LOW (ref 8.9–10.3)
Chloride: 103 mmol/L (ref 101–111)
Creatinine, Ser: 0.91 mg/dL (ref 0.61–1.24)
GFR calc Af Amer: >60 mL/min (ref >60)
GFR calc non Af Amer: >60 mL/min (ref >60)
Glucose, Bld: 95 mg/dL (ref 65–99)
Potassium: 4.1 mmol/L (ref 3.5–5.1)
Sodium: 138 mmol/L (ref 135–145)

## 2017-08-04 LAB — CBC WITH DIFFERENTIAL/PLATELET
Basophils Absolute: 0 10*3/uL (ref 0.0–0.1)
Basophils Relative: 0 %
Eosinophils Absolute: 0.1 10*3/uL (ref 0.0–0.7)
Eosinophils Relative: 1 %
HCT: 41.4 % (ref 39.0–52.0)
Hemoglobin: 14.1 g/dL (ref 13.0–17.0)
Lymphocytes Relative: 32 %
Lymphs Abs: 2 10*3/uL (ref 0.7–4.0)
MCH: 29.9 pg (ref 26.0–34.0)
MCHC: 34.1 g/dL (ref 30.0–36.0)
MCV: 87.9 fL (ref 78.0–100.0)
Monocytes Absolute: 0.7 10*3/uL (ref 0.1–1.0)
Monocytes Relative: 11 %
Neutro Abs: 3.4 10*3/uL (ref 1.7–7.7)
Neutrophils Relative %: 56 %
Platelets: 165 10*3/uL (ref 150–400)
RBC: 4.71 MIL/uL (ref 4.22–5.81)
RDW: 13.5 % (ref 11.5–15.5)
WBC: 6.1 10*3/uL (ref 4.0–10.5)

## 2017-08-04 MED ORDER — DOXYCYCLINE HYCLATE 100 MG PO CAPS: 100.0000 mg | ORAL_CAPSULE | Freq: Two times a day (BID) | ORAL | 0 refills | Status: DC

## 2017-08-04 NOTE — Discharge Instructions (Signed)
Return here tomorrow morning that 8:30 AM for the vascular ultrasound.  Return for any worsening in his condition follow-up with his primary doctor.

## 2017-08-04 NOTE — ED Provider Notes (Signed)
[facility] EMERGENCY DEPARTMENT Provider Note   CSN: [id number] Arrival date & time: 08/04/17  2012     History   Chief Complaint Chief Complaint  Patient presents with  . Leg Pain    HPI [name] is a 77 y.o. male.  HPI Patient presents to the emergency department with increasing redness to the right lower leg along with edema and increased warmth that began today.  The patient's wife gives the history for the patient.  And wife states that he has had no known fevers.  She is concerned that he may have a blood clot.  The patient did have a recent fall about 2 weeks ago.  The patient denies chest pain, shortness of breath, headache,blurred vision, neck pain, fever, cough, weakness, numbness, dizziness, anorexia, edema, abdominal pain, nausea, vomiting, diarrhea, rash, back pain, dysuria, hematemesis, bloody stool, near syncope, or syncope. Past Medical History:  Diagnosis Date  . Fall   . Hypothyroidism   . Morbid obesity (HCC) 07/14/2012  . Obesity   . Parkinson disease (HCC)   . UTI (urinary tract infection)   . Venous stasis    edema    Patient Active Problem List   Diagnosis Date Noted  . Recurrent falls 08/10/2014  . Falls 08/10/2014  . Weakness   . Parkinson disease (HCC)   . Morbid obesity (HCC) 07/14/2012  . WEAKNESS 01/30/2010  . URINARY INCONTINENCE 01/30/2010  . DYSURIA 11/13/2009  . PARKINSON'S DISEASE 01/29/2009    Past Surgical History:  Procedure Laterality Date  . anterior cervical discectomy C6-7    . APPENDECTOMY    . CERVICAL LAMINECTOMY    . FOOT SURGERY  1992   right foot repair        Home Medications    Prior to Admission medications   Medication Sig Start Date End Date Taking? Authorizing Provider  carbidopa-levodopa-entacapone (STALEVO) 25-100-200 MG tablet One pill 7 times a day, at 6, 9, 12, 3 PM, 6 PM, 9 PM and 11 PM. 07/06/17   Huston Foley, MD  escitalopram (LEXAPRO) 10 MG tablet Take 0.5 tablets (5 mg total)  by mouth daily. Increase to 1 pill daily after one month. 07/06/17   Huston Foley, MD  Guaifenesin 1200 MG TB12 Take 1 tablet (1,200 mg total) by mouth 2 (two) times daily. 09/22/16   Naquan Garman, Cristal Deer, PA-C  omeprazole (PRILOSEC) 20 MG capsule Take 20 mg by mouth daily.    [provider]    Family History Family History  Problem Relation Age of Onset  . Heart disease Mother   . Hyperlipidemia Mother   . Colon cancer Father   . Alcohol abuse Other   . Cancer Sister        breast     Social History Social History   Tobacco Use  . Smoking status: Former Smoker    Years: 2.00    Types: Cigarettes    Last attempt to quit: 03/26/1992    Years since quitting: 25.3  . Smokeless tobacco: Never Used  Substance Use Topics  . Alcohol use: No    Alcohol/week: 0.0 oz  . Drug use: No     Allergies   Patient has no known allergies.   Review of Systems Review of Systems Level 5 caveat applies due to poor historian  Physical Exam Updated Vital Signs BP 114/68 (BP Location: Right Arm)   Pulse 62   Temp 98.1 F (36.7 C) (Oral)   Resp 20   Ht 6'  2" (1.88 m)   Wt 108.9 kg (240 lb)   SpO2 97%   BMI 30.81 kg/m   Physical Exam  Constitutional: He is oriented to person, place, and time. He appears well-developed and well-nourished. No distress.  HENT:  Head: Normocephalic and atraumatic.  Eyes: Pupils are equal, round, and reactive to light.  Pulmonary/Chest: Effort normal.  Musculoskeletal:       Legs: Neurological: He is alert and oriented to person, place, and time.  Skin: Skin is warm and dry.  Psychiatric: He has a normal mood and affect.  Nursing note and vitals reviewed.    ED Treatments / Results  Labs (all labs ordered are listed, but only abnormal results are displayed) Labs Reviewed  BASIC METABOLIC PANEL - Abnormal; Notable for the following components:      Result Value   BUN 28 (*)    Calcium 8.6 (*)    All other components within normal limits   CBC WITH DIFFERENTIAL/PLATELET    EKG None  Radiology Dg Tibia/fibula Right  Result Date: 08/04/2017 CLINICAL DATA:  77 y/o M; right lower leg redness. Status post fall with hematoma. EXAM: RIGHT TIBIA AND FIBULA - 2 VIEW COMPARISON:  07/01/2015 right tibia fibula radiographs. FINDINGS: No acute fracture or dislocation. Osteoarthrosis of the knee joint with severe medial femorotibial compartment and mild patellofemoral compartment joint space narrowing. Chondrocalcinosis of the lateral femorotibial compartment. Small dorsal calcaneal enthesophyte. Osteophyte of talar head neck junction may represent anterior ankle impingement in the appropriate clinical setting. Stable from prior study. IMPRESSION: 1. No acute fracture or dislocation. 2. Stable osteoarthrosis of the knee joint greatest in the medial femorotibial compartment. 3. Chondrocalcinosis of the knee joint. 4. Osteophyte of talar neck may represent anterior ankle impingement in the appropriate clinical setting. Electronically Signed   By: Mitzi HansenLance  Furusawa-Stratton M.D.   On: 08/04/2017 23:08    Procedures Procedures (including critical care time)  Medications Ordered in ED Medications - No data to display   Initial Impression / Assessment and Plan / ED Course  I have reviewed the triage vital signs and the nursing notes.  Pertinent labs & imaging results that were available during my care of the patient were reviewed by me and considered in my medical decision making (see chart for details).     She will be started on antibiotics for possible colitis and then also will order a Doppler for tomorrow morning.  Patient told to return for this and follow-up with her primary doctor.  Return for any worsening in his condition  Final Clinical Impressions(s) / ED Diagnoses   Final diagnoses:  None    ED Discharge Orders    None       Kyra MangesLawyer, Leroy Pettway, PA-C 08/04/17 2325    Benjiman CorePickering, Nathan, MD 08/04/17 [id number]

## 2017-08-04 NOTE — ED Triage Notes (Signed)
Presents with Right lower leg redness, +2 weeping edema and warmth that began today when wife got home from work she noticed it. He fell Thursday morning and he has a hematoma to the right medial lower leg fromthat fall. THis swelling, warmth, and edema is new.

## 2017-08-05 ENCOUNTER — Ambulatory Visit (HOSPITAL_BASED_OUTPATIENT_CLINIC_OR_DEPARTMENT_OTHER)
Admission: RE | Admit: 2017-08-05 | Discharge: 2017-08-05 | Disposition: A | Discharge status: Home / Self Care | Payer: PPO | Source: Ambulatory Visit | Attending: Emergency Medicine | Admitting: Emergency Medicine

## 2017-08-05 DIAGNOSIS — M79604 Pain in right leg: Secondary | ICD-10-CM | POA: Diagnosis not present

## 2017-08-05 DIAGNOSIS — M7989 Other specified soft tissue disorders: Secondary | ICD-10-CM | POA: Insufficient documentation

## 2017-08-05 DIAGNOSIS — S8011XA Contusion of right lower leg, initial encounter: Secondary | ICD-10-CM | POA: Diagnosis not present

## 2017-08-05 MED FILL — DOXYCYCLINE HYCLATE 100 MG: 100 | 10 days supply | Qty: 20 | Fill #0

## 2017-08-13 ENCOUNTER — Other Ambulatory Visit: Payer: Self-pay | Admitting: *Deleted

## 2017-08-13 ENCOUNTER — Encounter: Payer: Self-pay | Admitting: *Deleted

## 2017-08-13 NOTE — Patient Outreach (Signed)
[facility]) Care Management  08/13/2017  [name] 10/09/1940 [id number]  Referral from-[facility] Utilization Management Department/Medical Director-Dr. Logan BoresEvans:  [facility] staff, [name] to see member to get closer evaluation than what is available in Epic or Acuity. Concerned that member may not be safe at home.  Telephone call to patient; left HIPPA compliant voice mail requesting return call.   Plan: Follow up 2-4 business days [employer] outreach letter.  Colleen CanLinda Donyale Falcon, RN BSN CCM Care Management Coordinator [facility] Care [facility]  [phone]

## 2017-08-17 ENCOUNTER — Other Ambulatory Visit: Payer: Self-pay | Admitting: *Deleted

## 2017-08-17 NOTE — Patient Outreach (Signed)
[facility]) Care Management  08/17/2017  [name] 09/13/1940 [id number]  Referral from-[facility] Utilization Management Department/Medical Director-Dr. Logan BoresEvans:  [facility], [name] to see member to get closer evaluation than what is available in Epic or Acuity. Concerned that member may not be safe at home.  Telephone call #2 to patient; call was answered & patient was advised of reason for acc & of [facility] services. Patient states call me back & disconnected call.  Plan: Will follow up in 2-4 business days. Outreach letter was sent 08/13/2017.  Colleen CanLinda Yaiza Palazzola, RN BSN CCM Care Management Coordinator [facility] Care [facility]  [phone]

## 2017-08-19 ENCOUNTER — Ambulatory Visit: Payer: Self-pay | Admitting: *Deleted

## 2017-08-25 ENCOUNTER — Other Ambulatory Visit: Payer: Self-pay | Admitting: *Deleted

## 2017-08-25 NOTE — Patient Outreach (Signed)
[facility]) Care Management  08/25/2017  [name] 03/09/1940 [id number]  Referral from-[facility] Utilization Management Department/Medical Director-Dr. Logan BoresEvans:  Request [facility] staff, [name] to see member to get closer evaluation than what is available in Epic or Acuity. Concerned that member may not be safe at home.  Telephone call attempt x 3; call was picked up but no answer. Telephone call attempt to alternate number; left HIPPA compliant voice mail.   Plan: Follow up call 2-4 days. Outreach letter was sent 6/27.  Eric CanLinda Keysha Damewood, RN BSN CCM Care Management Coordinator [facility] Care Management  [phone]

## 2017-08-27 ENCOUNTER — Telehealth: Payer: Self-pay | Admitting: *Deleted

## 2017-08-27 ENCOUNTER — Other Ambulatory Visit: Payer: Self-pay | Admitting: *Deleted

## 2017-08-27 NOTE — Telephone Encounter (Signed)
Copied from CRM [phone]. Topic: General - Other >> Aug 27, 2017  9:24 AM Maia Pettiesrtiz, Kristie S wrote: Reason for CRM: pts insurance company [facility]. Bonita QuinLinda has been trying to contact pt on home # and not able to reach him. She states she has left msgs but no return call. She wanted to alert the office in the event pt comes in or calls to have him contact [facility]. This is an optional program but recommended by his insurance. [facility] at [phone].

## 2017-08-27 NOTE — Patient Outreach (Signed)
[facility]) Care Management  08/27/2017  [name] 10/22/1940 [id number]  Referral from-[facility] Utilization Management Department/Medical Director-Dr. Logan BoresEvans:  [facility], [name] to see member to get closer evaluation than what is available in Epic or Acuity. Concerned that member may not be safe at home.  Telephone call #4 to patient. Person answered call & asked reason for calling. Person was advised of [facility] services & call was disconnected.  Care coordination call to primary care office; spoke with [name] to inquire if alternate number & to advise unable to contact patient. Voices last office visit for patient 06/24/2017. States appointment noted for future visit. States no alternate number. States she will send message to PCP to advise.   Plan: Follow up.   Eric CanLinda Gene Colee, RN BSN CCM Care Management Coordinator [facility] Care Management  [phone]

## 2017-08-31 NOTE — Telephone Encounter (Signed)
noted 

## 2017-09-01 ENCOUNTER — Ambulatory Visit: Payer: Self-pay | Admitting: Neurology

## 2017-09-11 ENCOUNTER — Other Ambulatory Visit: Payer: Self-pay | Admitting: *Deleted

## 2017-09-11 NOTE — Patient Outreach (Signed)
[facility]) Care Management  09/11/2017  Sherril CongJames B Cormany 07/19/1940 [id number]  Unsuccessful call attempts; No response to outreach letter.  MD office was notified; no alternate numbers.  Plan: Case closure.   Colleen CanLinda Lea Baine, RN BSN CCM Care Management Coordinator [facility] Care Management  [phone]

## 2017-09-30 MED FILL — CARBIDOPA-LEVODOPA-ENTA 100: 25-100-200 | 30 days supply | Qty: 210 | Fill #1

## 2017-10-30 MED FILL — CARBIDOPA-LEVODOPA-ENTA 100: 25-100-200 | 30 days supply | Qty: 210 | Fill #2

## 2017-11-04 ENCOUNTER — Encounter (HOSPITAL_BASED_OUTPATIENT_CLINIC_OR_DEPARTMENT_OTHER): Payer: Self-pay | Admitting: Emergency Medicine

## 2017-11-04 ENCOUNTER — Emergency Department (HOSPITAL_BASED_OUTPATIENT_CLINIC_OR_DEPARTMENT_OTHER)
Admission: EM | Admit: 2017-11-04 | Discharge: 2017-11-04 | Disposition: A | Discharge status: Home / Self Care | Payer: PPO | Source: Home / Self Care | Attending: Emergency Medicine | Admitting: Emergency Medicine

## 2017-11-04 ENCOUNTER — Other Ambulatory Visit: Payer: Self-pay

## 2017-11-04 ENCOUNTER — Emergency Department (HOSPITAL_BASED_OUTPATIENT_CLINIC_OR_DEPARTMENT_OTHER): Payer: PPO

## 2017-11-04 DIAGNOSIS — R319 Hematuria, unspecified: Secondary | ICD-10-CM | POA: Diagnosis not present

## 2017-11-04 DIAGNOSIS — Y9301 Activity, walking, marching and hiking: Secondary | ICD-10-CM | POA: Insufficient documentation

## 2017-11-04 DIAGNOSIS — N390 Urinary tract infection, site not specified: Secondary | ICD-10-CM | POA: Diagnosis present

## 2017-11-04 DIAGNOSIS — Y92002 Bathroom of unspecified non-institutional (private) residence single-family (private) house as the place of occurrence of the external cause: Secondary | ICD-10-CM | POA: Diagnosis not present

## 2017-11-04 DIAGNOSIS — R5381 Other malaise: Secondary | ICD-10-CM | POA: Diagnosis not present

## 2017-11-04 DIAGNOSIS — R001 Bradycardia, unspecified: Secondary | ICD-10-CM | POA: Diagnosis not present

## 2017-11-04 DIAGNOSIS — R51 Headache: Secondary | ICD-10-CM | POA: Diagnosis not present

## 2017-11-04 DIAGNOSIS — K219 Gastro-esophageal reflux disease without esophagitis: Secondary | ICD-10-CM | POA: Diagnosis present

## 2017-11-04 DIAGNOSIS — W19XXXA Unspecified fall, initial encounter: Secondary | ICD-10-CM | POA: Diagnosis not present

## 2017-11-04 DIAGNOSIS — S299XXA Unspecified injury of thorax, initial encounter: Secondary | ICD-10-CM | POA: Diagnosis not present

## 2017-11-04 DIAGNOSIS — S0990XA Unspecified injury of head, initial encounter: Secondary | ICD-10-CM | POA: Diagnosis not present

## 2017-11-04 DIAGNOSIS — W01198A Fall on same level from slipping, tripping and stumbling with subsequent striking against other object, initial encounter: Secondary | ICD-10-CM

## 2017-11-04 DIAGNOSIS — R358 Other polyuria: Secondary | ICD-10-CM | POA: Diagnosis present

## 2017-11-04 DIAGNOSIS — S5002XA Contusion of left elbow, initial encounter: Secondary | ICD-10-CM | POA: Insufficient documentation

## 2017-11-04 DIAGNOSIS — R220 Localized swelling, mass and lump, head: Secondary | ICD-10-CM | POA: Diagnosis not present

## 2017-11-04 DIAGNOSIS — R32 Unspecified urinary incontinence: Secondary | ICD-10-CM | POA: Diagnosis present

## 2017-11-04 DIAGNOSIS — Z7401 Bed confinement status: Secondary | ICD-10-CM | POA: Diagnosis not present

## 2017-11-04 DIAGNOSIS — R55 Syncope and collapse: Secondary | ICD-10-CM | POA: Diagnosis not present

## 2017-11-04 DIAGNOSIS — F329 Major depressive disorder, single episode, unspecified: Secondary | ICD-10-CM | POA: Diagnosis present

## 2017-11-04 DIAGNOSIS — Z79899 Other long term (current) drug therapy: Secondary | ICD-10-CM

## 2017-11-04 DIAGNOSIS — I10 Essential (primary) hypertension: Secondary | ICD-10-CM | POA: Diagnosis not present

## 2017-11-04 DIAGNOSIS — R0902 Hypoxemia: Secondary | ICD-10-CM | POA: Diagnosis not present

## 2017-11-04 DIAGNOSIS — R0689 Other abnormalities of breathing: Secondary | ICD-10-CM | POA: Diagnosis not present

## 2017-11-04 DIAGNOSIS — M47812 Spondylosis without myelopathy or radiculopathy, cervical region: Secondary | ICD-10-CM | POA: Diagnosis not present

## 2017-11-04 DIAGNOSIS — Z0389 Encounter for observation for other suspected diseases and conditions ruled out: Secondary | ICD-10-CM | POA: Diagnosis not present

## 2017-11-04 DIAGNOSIS — S01112A Laceration without foreign body of left eyelid and periocular area, initial encounter: Secondary | ICD-10-CM | POA: Diagnosis present

## 2017-11-04 DIAGNOSIS — S0181XA Laceration without foreign body of other part of head, initial encounter: Secondary | ICD-10-CM

## 2017-11-04 DIAGNOSIS — W1830XA Fall on same level, unspecified, initial encounter: Secondary | ICD-10-CM | POA: Diagnosis present

## 2017-11-04 DIAGNOSIS — G20 Parkinson's disease: Secondary | ICD-10-CM | POA: Diagnosis present

## 2017-11-04 DIAGNOSIS — E039 Hypothyroidism, unspecified: Secondary | ICD-10-CM

## 2017-11-04 DIAGNOSIS — S0121XA Laceration without foreign body of nose, initial encounter: Secondary | ICD-10-CM | POA: Diagnosis present

## 2017-11-04 DIAGNOSIS — R159 Full incontinence of feces: Secondary | ICD-10-CM | POA: Diagnosis present

## 2017-11-04 DIAGNOSIS — R531 Weakness: Secondary | ICD-10-CM | POA: Diagnosis not present

## 2017-11-04 DIAGNOSIS — Y999 Unspecified external cause status: Secondary | ICD-10-CM | POA: Insufficient documentation

## 2017-11-04 DIAGNOSIS — S8001XA Contusion of right knee, initial encounter: Secondary | ICD-10-CM

## 2017-11-04 DIAGNOSIS — R296 Repeated falls: Secondary | ICD-10-CM | POA: Diagnosis present

## 2017-11-04 DIAGNOSIS — R410 Disorientation, unspecified: Secondary | ICD-10-CM | POA: Insufficient documentation

## 2017-11-04 DIAGNOSIS — R498 Other voice and resonance disorders: Secondary | ICD-10-CM | POA: Diagnosis not present

## 2017-11-04 DIAGNOSIS — S5001XA Contusion of right elbow, initial encounter: Secondary | ICD-10-CM

## 2017-11-04 DIAGNOSIS — F411 Generalized anxiety disorder: Secondary | ICD-10-CM | POA: Diagnosis present

## 2017-11-04 DIAGNOSIS — S0993XA Unspecified injury of face, initial encounter: Secondary | ICD-10-CM | POA: Diagnosis not present

## 2017-11-04 DIAGNOSIS — A419 Sepsis, unspecified organism: Secondary | ICD-10-CM | POA: Diagnosis present

## 2017-11-04 DIAGNOSIS — R000 Tachycardia, unspecified: Secondary | ICD-10-CM | POA: Diagnosis not present

## 2017-11-04 DIAGNOSIS — M546 Pain in thoracic spine: Secondary | ICD-10-CM | POA: Diagnosis not present

## 2017-11-04 DIAGNOSIS — R509 Fever, unspecified: Secondary | ICD-10-CM | POA: Diagnosis present

## 2017-11-04 DIAGNOSIS — R05 Cough: Secondary | ICD-10-CM | POA: Diagnosis present

## 2017-11-04 DIAGNOSIS — M48061 Spinal stenosis, lumbar region without neurogenic claudication: Secondary | ICD-10-CM | POA: Diagnosis not present

## 2017-11-04 DIAGNOSIS — S8002XA Contusion of left knee, initial encounter: Secondary | ICD-10-CM

## 2017-11-04 DIAGNOSIS — Z87891 Personal history of nicotine dependence: Secondary | ICD-10-CM | POA: Diagnosis not present

## 2017-11-04 DIAGNOSIS — B9620 Unspecified Escherichia coli [E. coli] as the cause of diseases classified elsewhere: Secondary | ICD-10-CM | POA: Diagnosis present

## 2017-11-04 DIAGNOSIS — R2689 Other abnormalities of gait and mobility: Secondary | ICD-10-CM | POA: Diagnosis not present

## 2017-11-04 DIAGNOSIS — R1312 Dysphagia, oropharyngeal phase: Secondary | ICD-10-CM | POA: Diagnosis not present

## 2017-11-04 DIAGNOSIS — R41841 Cognitive communication deficit: Secondary | ICD-10-CM | POA: Diagnosis not present

## 2017-11-04 DIAGNOSIS — M2550 Pain in unspecified joint: Secondary | ICD-10-CM | POA: Diagnosis not present

## 2017-11-04 DIAGNOSIS — M6281 Muscle weakness (generalized): Secondary | ICD-10-CM | POA: Diagnosis not present

## 2017-11-04 DIAGNOSIS — R079 Chest pain, unspecified: Secondary | ICD-10-CM | POA: Diagnosis not present

## 2017-11-04 LAB — CBC WITH DIFFERENTIAL/PLATELET
Basophils Absolute: 0 10*3/uL (ref 0.0–0.1)
Basophils Relative: 0 %
Eosinophils Absolute: 0.1 10*3/uL (ref 0.0–0.7)
Eosinophils Relative: 1 %
HEMATOCRIT: 42.2 % (ref 39.0–52.0)
HEMOGLOBIN: 14.4 g/dL (ref 13.0–17.0)
LYMPHS PCT: 22 %
Lymphs Abs: 1.2 10*3/uL (ref 0.7–4.0)
MCH: 30 pg (ref 26.0–34.0)
MCHC: 34.1 g/dL (ref 30.0–36.0)
MCV: 87.9 fL (ref 78.0–100.0)
MONOS PCT: 11 %
Monocytes Absolute: 0.6 10*3/uL (ref 0.1–1.0)
NEUTROS ABS: 3.8 10*3/uL (ref 1.7–7.7)
NEUTROS PCT: 66 %
Platelets: 144 10*3/uL — ABNORMAL LOW (ref 150–400)
RBC: 4.8 MIL/uL (ref 4.22–5.81)
RDW: 13.2 % (ref 11.5–15.5)
WBC: 5.6 10*3/uL (ref 4.0–10.5)

## 2017-11-04 LAB — COMPREHENSIVE METABOLIC PANEL
AST: 20 U/L (ref 15–41)
Albumin: 3.8 g/dL (ref 3.5–5.0)
Alkaline Phosphatase: 74 U/L (ref 38–126)
Anion gap: 0 — ABNORMAL LOW (ref 5–15)
BUN: 25 mg/dL — ABNORMAL HIGH (ref 8–23)
CO2: 35 mmol/L — AB (ref 22–32)
CREATININE: 0.8 mg/dL (ref 0.61–1.24)
Calcium: 8.4 mg/dL — ABNORMAL LOW (ref 8.9–10.3)
Chloride: 103 mmol/L (ref 98–111)
GFR calc non Af Amer: >60 mL/min (ref >60)
Glucose, Bld: 97 mg/dL (ref 70–99)
POTASSIUM: 4.1 mmol/L (ref 3.5–5.1)
Sodium: 138 mmol/L (ref 135–145)
Total Bilirubin: 1.1 mg/dL (ref 0.3–1.2)
Total Protein: 7 g/dL (ref 6.5–8.1)

## 2017-11-04 LAB — URINALYSIS, ROUTINE W REFLEX MICROSCOPIC
BILIRUBIN URINE: NEGATIVE
Glucose, UA: NEGATIVE mg/dL
Ketones, ur: 15 mg/dL — AB
Leukocytes, UA: NEGATIVE
NITRITE: NEGATIVE
Protein, ur: NEGATIVE mg/dL
pH: 6 (ref 5.0–8.0)

## 2017-11-04 LAB — URINALYSIS, MICROSCOPIC (REFLEX)

## 2017-11-04 LAB — TROPONIN I: Troponin I: <0.03 ng/mL (ref <0.03)

## 2017-11-04 MED ORDER — LIDOCAINE HCL URETHRAL/MUCOSAL 2 % EX GEL
1.0000 "application " | Freq: Once | CUTANEOUS | Status: AC
  Administered 2017-11-04: 1 via TOPICAL

## 2017-11-04 MED ORDER — LIDOCAINE-EPINEPHRINE 2 %-1:100000 IJ SOLN
20.0000 mL | Freq: Once | INTRAMUSCULAR | Status: DC
  Filled 2017-11-04: qty 20

## 2017-11-04 MED ORDER — LIDOCAINE-EPINEPHRINE (PF) 2 %-1:200000 IJ SOLN
INTRAMUSCULAR | Status: AC
  Filled 2017-11-04: qty 10

## 2017-11-04 MED ORDER — LIDOCAINE HCL URETHRAL/MUCOSAL 2 % EX GEL
CUTANEOUS | Status: AC
  Filled 2017-11-04: qty 20

## 2017-11-04 MED ORDER — LIDOCAINE HCL 2 % IJ SOLN
INTRAMUSCULAR | Status: AC
  Filled 2017-11-04: qty 20

## 2017-11-04 MED ORDER — LIDOCAINE-EPINEPHRINE (PF) 2 %-1:200000 IJ SOLN
INTRAMUSCULAR | Status: AC
  Administered 2017-11-04: 10 mL
  Filled 2017-11-04: qty 10

## 2017-11-04 NOTE — ED Provider Notes (Signed)
[facility] EMERGENCY DEPARTMENT Provider Note   CSN: [id number] Arrival date & time: 11/04/17  4098     History   Chief Complaint Chief Complaint  Patient presents with  . Fall    HPI [name] is a 77 y.o. male.  77 yo M with significant past medical history of Parkinson's disease comes in with a chief complaint of a fall.  This happened this morning.  Patient was trying to walk to the bathroom and he lost his balance while using his walker.  Struck his face onto the ground of the bathroom floor.  Patient said his vision got blurry for about half a seconds and then returned.  Planing of some pain focally to the face denies neck pain denies pain to the extremities denies abdominal pain chest pain.  The patient spouse arrived and stated that he has had multiple falls over the past couple days.  She feels that he is unsafe at home and feels that he likely needs to go to a rehab facility.  He has been somewhat more confused over the past couple days.  The history is provided by the patient and the spouse.  Fall  This is a recurrent problem. The current episode started less than 1 hour ago. The problem occurs constantly. The problem has not changed since onset.Associated symptoms include headaches. Pertinent negatives include no chest pain, no abdominal pain and no shortness of breath. Nothing aggravates the symptoms. Nothing relieves the symptoms. He has tried nothing for the symptoms. The treatment provided no relief.    Past Medical History:  Diagnosis Date  . Fall   . Hypothyroidism   . Morbid obesity (HCC) 07/14/2012  . Obesity   . Parkinson disease (HCC)   . UTI (urinary tract infection)   . Venous stasis    edema    Patient Active Problem List   Diagnosis Date Noted  . Recurrent falls 08/10/2014  . Falls 08/10/2014  . Weakness   . Parkinson disease (HCC)   . Morbid obesity (HCC) 07/14/2012  . WEAKNESS 01/30/2010  . URINARY INCONTINENCE 01/30/2010  .  DYSURIA 11/13/2009  . PARKINSON'S DISEASE 01/29/2009    Past Surgical History:  Procedure Laterality Date  . anterior cervical discectomy C6-7    . APPENDECTOMY    . CERVICAL LAMINECTOMY    . FOOT SURGERY  1992   right foot repair        Home Medications    Prior to Admission medications   Medication Sig Start Date End Date Taking? Authorizing Provider  carbidopa-levodopa-entacapone (STALEVO) 25-100-200 MG tablet One pill 7 times a day, at 6, 9, 12, 3 PM, 6 PM, 9 PM and 11 PM. 07/06/17   Huston Foley, MD  doxycycline (VIBRAMYCIN) 100 MG capsule Take 1 capsule (100 mg total) by mouth 2 (two) times daily. 08/04/17   Lawyer, Cristal Deer, PA-C  escitalopram (LEXAPRO) 10 MG tablet Take 0.5 tablets (5 mg total) by mouth daily. Increase to 1 pill daily after one month. 07/06/17   Huston Foley, MD  Guaifenesin 1200 MG TB12 Take 1 tablet (1,200 mg total) by mouth 2 (two) times daily. 09/22/16   Lawyer, Cristal Deer, PA-C  omeprazole (PRILOSEC) 20 MG capsule Take 20 mg by mouth daily.    [provider]    Family History Family History  Problem Relation Age of Onset  . Heart disease Mother   . Hyperlipidemia Mother   . Colon cancer Father   . Alcohol abuse Other   .  Cancer Sister        breast     Social History Social History   Tobacco Use  . Smoking status: Former Smoker    Years: 2.00    Types: Cigarettes    Last attempt to quit: 03/26/1992    Years since quitting: 25.6  . Smokeless tobacco: Never Used  Substance Use Topics  . Alcohol use: No    Alcohol/week: 0.0 standard drinks  . Drug use: No     Allergies   Patient has no known allergies.   Review of Systems Review of Systems  Constitutional: Negative for chills and fever.  HENT: Negative for congestion and facial swelling.   Eyes: Negative for discharge and visual disturbance.  Respiratory: Negative for shortness of breath.   Cardiovascular: Negative for chest pain and palpitations.  Gastrointestinal:  Negative for abdominal pain, diarrhea and vomiting.  Musculoskeletal: Negative for arthralgias and myalgias.  Skin: Negative for color change and rash.  Neurological: Positive for headaches. Negative for tremors and syncope.  Psychiatric/Behavioral: Negative for confusion and dysphoric mood.     Physical Exam Updated Vital Signs BP 126/65 (BP Location: Right Arm)   Pulse (!) 56   Temp (!) 97.5 F (36.4 C) (Oral)   Resp 18   Ht 6\' 2"  (1.88 m)   Wt 106.6 kg   SpO2 100%   BMI 30.17 kg/m   Physical Exam  Constitutional: He is oriented to person, place, and time. He appears well-developed and well-nourished.  HENT:  Head: Normocephalic.  2.7 cm laceration above the left eyebrow.  Superficial laceration to the bridge of the nose.  No orbital rib tenderness.  Extraocular motor intact.  No noted C-spine tenderness.  Moves his head around in all directions without pain.  Eyes: Pupils are equal, round, and reactive to light. EOM are normal.  Neck: Normal range of motion. Neck supple. No JVD present.  Cardiovascular: Normal rate and regular rhythm. Exam reveals no gallop and no friction rub.  No murmur heard. Pulmonary/Chest: No respiratory distress. He has no wheezes.  Abdominal: He exhibits no distension and no mass. There is no tenderness. There is no rebound and no guarding.  Musculoskeletal: Normal range of motion.  Scattered bruises throughout the patient's body.  Mostly on the extremities the knees elbows.  No midline spinal tenderness.  Neurological: He is alert and oriented to person, place, and time.  Skin: No rash noted. No pallor.  Psychiatric: He has a normal mood and affect. His behavior is normal.  Nursing note and vitals reviewed.    ED Treatments / Results  Labs (all labs ordered are listed, but only abnormal results are displayed) Labs Reviewed  CBC WITH DIFFERENTIAL/PLATELET - Abnormal; Notable for the following components:      Result Value   Platelets 144  (*)    All other components within normal limits  URINALYSIS, ROUTINE W REFLEX MICROSCOPIC - Abnormal; Notable for the following components:   Color, Urine ORANGE (*)    Specific Gravity, Urine >1.030 (*)    Hgb urine dipstick MODERATE (*)    Ketones, ur 15 (*)    All other components within normal limits  COMPREHENSIVE METABOLIC PANEL - Abnormal; Notable for the following components:   CO2 35 (*)    BUN 25 (*)    Calcium 8.4 (*)    Anion gap 0 (*)    All other components within normal limits  URINALYSIS, MICROSCOPIC (REFLEX) - Abnormal; Notable for the following components:  Bacteria, UA RARE (*)    All other components within normal limits  TROPONIN I    EKG EKG Interpretation  Date/Time:  Wednesday November 04 2017 07:52:26 EDT Ventricular Rate:  67 PR Interval:    QRS Duration: 104 QT Interval:  432 QTC Calculation: 457 R Axis:   2 Text Interpretation:  Sinus rhythm Abnormal R-wave progression, early transition No significant change since last tracing Confirmed by Melene Plan [phone]) on 11/04/2017 7:57:13 AM   Radiology Dg Chest 2 View  Result Date: 11/04/2017 CLINICAL DATA:  Pain following fall EXAM: CHEST - 2 VIEW COMPARISON:  Jun 28, 2017 FINDINGS: Lungs are clear. Heart size and pulmonary vascularity are normal. No adenopathy. There is aortic atherosclerosis. There is degenerative change in the thoracic spine. No pneumothorax. IMPRESSION: No edema or consolidation. Stable cardiac silhouette. There is aortic atherosclerosis. No evident pneumothorax. Aortic Atherosclerosis (ICD10-I70.0). Electronically Signed   By: Bretta Bang III M.D.   On: 11/04/2017 07:56   Ct Head Wo Contrast  Result Date: 11/04/2017 CLINICAL DATA:  Pain following fall EXAM: CT HEAD WITHOUT CONTRAST CT MAXILLOFACIAL WITHOUT CONTRAST TECHNIQUE: Multidetector CT imaging of the head and maxillofacial structures were performed using the standard protocol without intravenous contrast. Multiplanar CT  image reconstructions of the maxillofacial structures were also generated. COMPARISON:  Head CT Jun 28, 2017 FINDINGS: CT HEAD FINDINGS Brain: There is age related volume loss, stable. There is no evident mass, hemorrhage, extra-axial fluid collection, or midline shift. Patchy small vessel disease in the centra semiovale bilaterally is stable. No new gray-white compartment lesions are evident. No evident acute infarct. Vascular: There is no hyperdense vessel. There is calcification in each carotid siphon region. There is calcification in the distal right vertebral artery. Skull: Bony calvarium appears intact. There is a left parietal scalp hematoma. Other: Mastoid air cells are clear. There is debris in each external auditory canal, more on the left than on the right. CT MAXILLOFACIAL FINDINGS Osseous: There is no evident fracture or dislocation. No blastic or lytic bone lesions. Orbits: There is preseptal soft tissue swelling over the left orbit. There is no intraorbital lesion. Intraorbital contents appear symmetric bilaterally. Globes bilaterally appear intact. Sinuses: There is slight mucosal thickening in the inferolateral left maxillary antrum. There is mucosal thickening in several ethmoid air cells. Other paranasal sinuses are clear. No air-fluid level. No bony destruction or expansion. Ostiomeatal unit complexes are patent bilaterally. There is leftward deviation of the nasal septum. Nares are not obstructed on either side. Soft tissues: There is soft tissue swelling over the medial upper face and preseptal orbital region. No other soft tissue swelling or hematoma. No abscess. Visualized salivary glands appear symmetric bilaterally. There is no adenopathy. Tongue and tongue base regions appear normal. Visualize pharynx appears normal. There is pannus posterior to the odontoid. Visualized upper cervical spine appears intact. There is left and right carotid artery calcification. IMPRESSION: Head CT: Age  related volume loss with patchy supratentorial small vessel disease. No acute infarct. No mass or hemorrhage. There are foci of arterial vascular calcification. There is a left parietal scalp hematoma. No fracture. There is probable cerumen in the external auditory canals, more severe on the left than on the right. CT maxillofacial: 1.  No evident fracture or dislocation. 2. No intraorbital lesions. There is preseptal soft tissue swelling over the left orbit. 3. Left upper facial swelling. No well-defined hematoma. No abscess. 4. Mucosal thickening in several ethmoid air cells. Slight mucosal thickening in the inferolateral left  maxillary antrum. Other paranasal sinuses are clear. Ostiomeatal unit complexes are patent bilaterally. There is leftward deviation of the nasal septum. Electronically Signed   By: Bretta Bang III M.D.   On: 11/04/2017 08:04   Ct Maxillofacial Wo Contrast  Result Date: 11/04/2017 CLINICAL DATA:  Pain following fall EXAM: CT HEAD WITHOUT CONTRAST CT MAXILLOFACIAL WITHOUT CONTRAST TECHNIQUE: Multidetector CT imaging of the head and maxillofacial structures were performed using the standard protocol without intravenous contrast. Multiplanar CT image reconstructions of the maxillofacial structures were also generated. COMPARISON:  Head CT Jun 28, 2017 FINDINGS: CT HEAD FINDINGS Brain: There is age related volume loss, stable. There is no evident mass, hemorrhage, extra-axial fluid collection, or midline shift. Patchy small vessel disease in the centra semiovale bilaterally is stable. No new gray-white compartment lesions are evident. No evident acute infarct. Vascular: There is no hyperdense vessel. There is calcification in each carotid siphon region. There is calcification in the distal right vertebral artery. Skull: Bony calvarium appears intact. There is a left parietal scalp hematoma. Other: Mastoid air cells are clear. There is debris in each external auditory canal, more on the  left than on the right. CT MAXILLOFACIAL FINDINGS Osseous: There is no evident fracture or dislocation. No blastic or lytic bone lesions. Orbits: There is preseptal soft tissue swelling over the left orbit. There is no intraorbital lesion. Intraorbital contents appear symmetric bilaterally. Globes bilaterally appear intact. Sinuses: There is slight mucosal thickening in the inferolateral left maxillary antrum. There is mucosal thickening in several ethmoid air cells. Other paranasal sinuses are clear. No air-fluid level. No bony destruction or expansion. Ostiomeatal unit complexes are patent bilaterally. There is leftward deviation of the nasal septum. Nares are not obstructed on either side. Soft tissues: There is soft tissue swelling over the medial upper face and preseptal orbital region. No other soft tissue swelling or hematoma. No abscess. Visualized salivary glands appear symmetric bilaterally. There is no adenopathy. Tongue and tongue base regions appear normal. Visualize pharynx appears normal. There is pannus posterior to the odontoid. Visualized upper cervical spine appears intact. There is left and right carotid artery calcification. IMPRESSION: Head CT: Age related volume loss with patchy supratentorial small vessel disease. No acute infarct. No mass or hemorrhage. There are foci of arterial vascular calcification. There is a left parietal scalp hematoma. No fracture. There is probable cerumen in the external auditory canals, more severe on the left than on the right. CT maxillofacial: 1.  No evident fracture or dislocation. 2. No intraorbital lesions. There is preseptal soft tissue swelling over the left orbit. 3. Left upper facial swelling. No well-defined hematoma. No abscess. 4. Mucosal thickening in several ethmoid air cells. Slight mucosal thickening in the inferolateral left maxillary antrum. Other paranasal sinuses are clear. Ostiomeatal unit complexes are patent bilaterally. There is leftward  deviation of the nasal septum. Electronically Signed   By: Bretta Bang III M.D.   On: 11/04/2017 08:04    Procedures .Marland KitchenLaceration Repair Date/Time: 11/04/2017 10:58 AM Performed by: Melene Plan, DO Authorized by: Melene Plan, DO   Consent:    Consent obtained:  Verbal   Consent given by:  Patient and spouse   Risks discussed:  Infection, pain, poor cosmetic result and poor wound healing   Alternatives discussed:  No treatment and delayed treatment Anesthesia (see MAR for exact dosages):    Anesthesia method:  Local infiltration   Local anesthetic:  Lidocaine 2% WITH epi Laceration details:    Location:  Face  Face location:  Nose   Length (cm):  1.5 Repair type:    Repair type:  Simple Pre-procedure details:    Preparation:  Patient was prepped and draped in usual sterile fashion Exploration:    Hemostasis achieved with:  Epinephrine and direct pressure   Wound exploration: entire depth of wound probed and visualized     Contaminated: no   Treatment:    Wound cleansed with: Chlorhexidine.   Irrigation solution:  Sterile saline   Irrigation volume:  10   Irrigation method:  Syringe   Visualized foreign bodies/material removed: no   Skin repair:    Repair method:  Sutures   Suture size:  5-0   Suture material:  Fast-absorbing gut   Suture technique:  Simple interrupted   Number of sutures:  2 Approximation:    Approximation:  Close Post-procedure details:    Dressing:  Open (no dressing)   Patient tolerance of procedure:  Tolerated well, no immediate complications .Marland KitchenLaceration Repair Date/Time: 11/04/2017 10:59 AM Performed by: Melene Plan, DO Authorized by: Melene Plan, DO   Consent:    Consent obtained:  Verbal   Consent given by:  Patient and spouse   Risks discussed:  Infection, pain, poor cosmetic result and poor wound healing   Alternatives discussed:  No treatment, delayed treatment and observation Anesthesia (see MAR for exact dosages):    Anesthesia  method:  Local infiltration   Local anesthetic:  Lidocaine 2% WITH epi Laceration details:    Location:  Face   Face location:  L eyebrow   Length (cm):  2.7 Repair type:    Repair type:  Simple Exploration:    Hemostasis achieved with:  Direct pressure and epinephrine   Wound exploration: entire depth of wound probed and visualized     Contaminated: no   Treatment:    Wound cleansed with: Chlorhexidine.   Amount of cleaning:  Standard   Irrigation solution:  Sterile saline   Irrigation volume:  15   Visualized foreign bodies/material removed: no   Skin repair:    Repair method:  Sutures   Suture size:  5-0   Suture material:  Fast-absorbing gut   Suture technique:  Simple interrupted   Number of sutures:  3 Approximation:    Approximation:  Close Post-procedure details:    Dressing:  Open (no dressing)   Patient tolerance of procedure:  Tolerated well, no immediate complications   (including critical care time)  Medications Ordered in ED Medications  lidocaine-EPINEPHrine (XYLOCAINE W/EPI) 2 %-1:100000 (with pres) injection 20 mL ( Infiltration Canceled Entry 11/04/17 1004)  lidocaine-EPINEPHrine (XYLOCAINE W/EPI) 2 %-1:200000 (PF) injection (10 mLs  Given 11/04/17 0900)  lidocaine (XYLOCAINE) 2 % jelly 1 application (1 application Topical Given 11/04/17 1003)     Initial Impression / Assessment and Plan / ED Course  I have reviewed the triage vital signs and the nursing notes.  Pertinent labs & imaging results that were available during my care of the patient were reviewed by me and considered in my medical decision making (see chart for details).     77 yo M with a chief complaint of a fall.  Sounds mechanical by history though family then added more history that he has been having more frequent falls over the past 3 or 4 days.  Will perform a laboratory evaluation.  Social work consulted for help with possible placement if needed.  UA without infection.  Chest x-ray  negative for focal infiltrate is viewed by me.  CT of the head face negative.  Screening troponin negative, hemoglobin at baseline.  Metabolic alkalosis.   I discussed the results with the family.  Social work and case management were able to work on home health for the family.  They will sign a Child psychotherapist to the case to try and find placement if needed.  Discharge home.  11:01 AM:  I have discussed the diagnosis/risks/treatment options with the patient and family and believe the pt to be eligible for discharge home to follow-up with PCP. We also discussed returning to the ED immediately if new or worsening sx occur. We discussed the sx which are most concerning (e.g., sudden worsening pain, fever, inability to tolerate by mouth) that necessitate immediate return. Medications administered to the patient during their visit and any new prescriptions provided to the patient are listed below.  Medications given during this visit Medications  lidocaine-EPINEPHrine (XYLOCAINE W/EPI) 2 %-1:100000 (with pres) injection 20 mL ( Infiltration Canceled Entry 11/04/17 1004)  lidocaine-EPINEPHrine (XYLOCAINE W/EPI) 2 %-1:200000 (PF) injection (10 mLs  Given 11/04/17 0900)  lidocaine (XYLOCAINE) 2 % jelly 1 application (1 application Topical Given 11/04/17 1003)      The patient appears reasonably screen and/or stabilized for discharge and I doubt any other medical condition or other [facility] requiring further screening, evaluation, or treatment in the ED at this time prior to discharge.    Final Clinical Impressions(s) / ED Diagnoses   Final diagnoses:  Fall, initial encounter  Facial laceration, initial encounter    ED Discharge Orders    None       Melene Plan, DO 11/04/17 1101

## 2017-11-04 NOTE — Care Management Note (Signed)
Case Management Note  Patient Details  Name: Eric CongJames B Martelli MRN: [id number] Date of Birth: 04/28/1940  [facility] with possible [facility] placement from the community.  CM spoke with pt's spouse who choose [facility].  CM contacted [facility] for [facility] and will start pt's care tomorrow.  Updated Dr. Adela LankFloyd and CSW.  No further CM needs noted at this time.  Expected Discharge Date:   11/04/2017               Expected Discharge Plan:  Home w Home Health Services  In-House Referral:  Clinical Social Work  Discharge planning Services  CM Consult  Post Acute Care Choice:  Home Health Choice offered to:  Spouse  HH Arranged:  RN, PT, OT, Nurse's Aide, Speech Therapy, Social Work [facility]  Status of Service:  Completed, signed off  Alexandr Oehler, Lynnae Sandhoffngela N, RN 11/04/2017, 10:49 AM

## 2017-11-04 NOTE — Discharge Instructions (Addendum)
Return to the ED for worsening.  Please follow up with his family doc as well.  His sutures should dissolve in about 5 days if there is still there he can pluck them out as needed.  I have given you the number of ear nose and throat doctor.  If you would like them to evaluate your nose for possible straightening.

## 2017-11-04 NOTE — ED Triage Notes (Signed)
Per EMS pt sustained mechanical fall on hard wood floor hitting front of head. Pt has laceration above left eye and bridge of nose.

## 2017-11-04 NOTE — Progress Notes (Signed)
CSW aware of consult. Patient presenting from home after a fall. CSW consulted for possible placement due to patient experiencing frequent falls recently. CSW spoke with wife, [name] [phone], regarding patient disposition. Per wife, patient has been having increased falls at home and expressed concerns for caring for patient at home. Per wife, she would like patient to receive rehab. Patient's wife stated that patient starts out the morning with a walker but is wheelchair bound for most of the day. Patient's wife reported he has been like this for most of the summer but reported it has become worse over the last three weeks. Patient's wife also reported that patient has gotten to the point that he is no longer able to administer his own medications. CSW discussed barriers to placement with wife. CSW to consult with RNCM regarding home health services and a HHSW to assist in possible placement from the community.   [name], LCSWA  Clinical Social Work Department  [facility]  [phone]

## 2017-11-05 ENCOUNTER — Inpatient Hospital Stay (HOSPITAL_COMMUNITY)
Admission: EM | Admit: 2017-11-05 | Discharge: 2017-11-09 | DRG: 872 | Disposition: A | Discharge status: Skilled Nursing Facility | Payer: PPO | Attending: Internal Medicine | Admitting: Internal Medicine

## 2017-11-05 ENCOUNTER — Telehealth: Payer: Self-pay | Admitting: Family Medicine

## 2017-11-05 ENCOUNTER — Emergency Department (HOSPITAL_COMMUNITY): Payer: PPO

## 2017-11-05 ENCOUNTER — Encounter (HOSPITAL_COMMUNITY): Payer: Self-pay

## 2017-11-05 ENCOUNTER — Other Ambulatory Visit: Payer: Self-pay

## 2017-11-05 DIAGNOSIS — F411 Generalized anxiety disorder: Secondary | ICD-10-CM | POA: Diagnosis present

## 2017-11-05 DIAGNOSIS — F329 Major depressive disorder, single episode, unspecified: Secondary | ICD-10-CM | POA: Diagnosis present

## 2017-11-05 DIAGNOSIS — W1830XA Fall on same level, unspecified, initial encounter: Secondary | ICD-10-CM | POA: Diagnosis present

## 2017-11-05 DIAGNOSIS — B9620 Unspecified Escherichia coli [E. coli] as the cause of diseases classified elsewhere: Secondary | ICD-10-CM | POA: Diagnosis present

## 2017-11-05 DIAGNOSIS — R296 Repeated falls: Secondary | ICD-10-CM | POA: Diagnosis present

## 2017-11-05 DIAGNOSIS — A419 Sepsis, unspecified organism: Principal | ICD-10-CM

## 2017-11-05 DIAGNOSIS — R159 Full incontinence of feces: Secondary | ICD-10-CM | POA: Diagnosis present

## 2017-11-05 DIAGNOSIS — E039 Hypothyroidism, unspecified: Secondary | ICD-10-CM | POA: Diagnosis present

## 2017-11-05 DIAGNOSIS — G20 Parkinson's disease: Secondary | ICD-10-CM | POA: Diagnosis present

## 2017-11-05 DIAGNOSIS — S01112A Laceration without foreign body of left eyelid and periocular area, initial encounter: Secondary | ICD-10-CM | POA: Diagnosis present

## 2017-11-05 DIAGNOSIS — R358 Other polyuria: Secondary | ICD-10-CM | POA: Diagnosis present

## 2017-11-05 DIAGNOSIS — Y92002 Bathroom of unspecified non-institutional (private) residence single-family (private) house as the place of occurrence of the external cause: Secondary | ICD-10-CM

## 2017-11-05 DIAGNOSIS — R509 Fever, unspecified: Secondary | ICD-10-CM

## 2017-11-05 DIAGNOSIS — R05 Cough: Secondary | ICD-10-CM | POA: Diagnosis present

## 2017-11-05 DIAGNOSIS — S0121XA Laceration without foreign body of nose, initial encounter: Secondary | ICD-10-CM | POA: Diagnosis present

## 2017-11-05 DIAGNOSIS — Z79899 Other long term (current) drug therapy: Secondary | ICD-10-CM

## 2017-11-05 DIAGNOSIS — M549 Dorsalgia, unspecified: Secondary | ICD-10-CM

## 2017-11-05 DIAGNOSIS — R319 Hematuria, unspecified: Secondary | ICD-10-CM

## 2017-11-05 DIAGNOSIS — Z87891 Personal history of nicotine dependence: Secondary | ICD-10-CM

## 2017-11-05 DIAGNOSIS — R32 Unspecified urinary incontinence: Secondary | ICD-10-CM | POA: Diagnosis present

## 2017-11-05 DIAGNOSIS — K219 Gastro-esophageal reflux disease without esophagitis: Secondary | ICD-10-CM | POA: Diagnosis present

## 2017-11-05 DIAGNOSIS — N390 Urinary tract infection, site not specified: Secondary | ICD-10-CM | POA: Diagnosis present

## 2017-11-05 LAB — CBC WITH DIFFERENTIAL/PLATELET
BASOS ABS: 0 10*3/uL (ref 0.0–0.1)
BASOS PCT: 0 %
EOS ABS: 0 10*3/uL (ref 0.0–0.7)
Eosinophils Relative: 0 %
HCT: 42.5 % (ref 39.0–52.0)
Hemoglobin: 14.3 g/dL (ref 13.0–17.0)
LYMPHS ABS: 0.3 10*3/uL — AB (ref 0.7–4.0)
Lymphocytes Relative: 2 %
MCH: 29.9 pg (ref 26.0–34.0)
MCHC: 33.6 g/dL (ref 30.0–36.0)
MCV: 88.9 fL (ref 78.0–100.0)
Monocytes Absolute: 0.1 10*3/uL (ref 0.1–1.0)
Monocytes Relative: 1 %
NEUTROS PCT: 97 %
Neutro Abs: 14.2 10*3/uL — ABNORMAL HIGH (ref 1.7–7.7)
Platelets: 166 10*3/uL (ref 150–400)
RBC: 4.78 MIL/uL (ref 4.22–5.81)
RDW: 13.4 % (ref 11.5–15.5)
WBC: 14.6 10*3/uL — AB (ref 4.0–10.5)

## 2017-11-05 LAB — URINALYSIS, ROUTINE W REFLEX MICROSCOPIC
BILIRUBIN URINE: NEGATIVE
GLUCOSE, UA: NEGATIVE mg/dL
Ketones, ur: 20 mg/dL — AB
NITRITE: NEGATIVE
PROTEIN: NEGATIVE mg/dL
Specific Gravity, Urine: 1.024 (ref 1.005–1.030)
pH: 5 (ref 5.0–8.0)

## 2017-11-05 LAB — COMPREHENSIVE METABOLIC PANEL
ALT: 5 U/L (ref 0–44)
AST: 21 U/L (ref 15–41)
Albumin: 3.7 g/dL (ref 3.5–5.0)
Alkaline Phosphatase: 69 U/L (ref 38–126)
Anion gap: 8 (ref 5–15)
BILIRUBIN TOTAL: 1.1 mg/dL (ref 0.3–1.2)
BUN: 25 mg/dL — AB (ref 8–23)
CHLORIDE: 107 mmol/L (ref 98–111)
CO2: 24 mmol/L (ref 22–32)
CREATININE: 0.73 mg/dL (ref 0.61–1.24)
Calcium: 8.5 mg/dL — ABNORMAL LOW (ref 8.9–10.3)
Glucose, Bld: 92 mg/dL (ref 70–99)
POTASSIUM: 3.9 mmol/L (ref 3.5–5.1)
Sodium: 139 mmol/L (ref 135–145)
TOTAL PROTEIN: 6.6 g/dL (ref 6.5–8.1)

## 2017-11-05 LAB — INFLUENZA PANEL BY PCR (TYPE A & B)
Influenza A By PCR: NEGATIVE
Influenza B By PCR: NEGATIVE

## 2017-11-05 LAB — TROPONIN I

## 2017-11-05 LAB — I-STAT CG4 LACTIC ACID, ED: LACTIC ACID, VENOUS: 1.9 mmol/L (ref 0.5–1.9)

## 2017-11-05 LAB — PROTIME-INR
INR: 1.04
PROTHROMBIN TIME: 13.5 s (ref 11.4–15.2)

## 2017-11-05 MED ORDER — SODIUM CHLORIDE 3 % IN NEBU
4.0000 mL | INHALATION_SOLUTION | Freq: Three times a day (TID) | RESPIRATORY_TRACT | Status: AC
  Administered 2017-11-06: 4 mL via RESPIRATORY_TRACT
  Filled 2017-11-05 (×9): qty 4

## 2017-11-05 MED ORDER — ESCITALOPRAM OXALATE 10 MG PO TABS: 5.0000 mg | ORAL_TABLET | Freq: Every day | ORAL | Status: DC

## 2017-11-05 MED ORDER — GUAIFENESIN ER 600 MG PO TB12
1200.0000 mg | ORAL_TABLET | Freq: Two times a day (BID) | ORAL | Status: DC
  Administered 2017-11-05 – 2017-11-09 (×8): 1200 mg via ORAL
  Filled 2017-11-05 (×8): qty 2

## 2017-11-05 MED ORDER — METRONIDAZOLE IN NACL 5-0.79 MG/ML-% IV SOLN
500.0000 mg | Freq: Three times a day (TID) | INTRAVENOUS | Status: DC
  Administered 2017-11-05: 500 mg via INTRAVENOUS
  Filled 2017-11-05: qty 100

## 2017-11-05 MED ORDER — SODIUM CHLORIDE 0.9 % IV SOLN
INTRAVENOUS | Status: DC
  Administered 2017-11-05 – 2017-11-09 (×6): via INTRAVENOUS

## 2017-11-05 MED ORDER — SODIUM CHLORIDE 0.9 % IV BOLUS (SEPSIS)
2000.0000 mL | Freq: Once | INTRAVENOUS | Status: AC
  Administered 2017-11-05: 2000 mL via INTRAVENOUS

## 2017-11-05 MED ORDER — ENTACAPONE 200 MG PO TABS
200.0000 mg | ORAL_TABLET | ORAL | Status: DC
  Administered 2017-11-05 (×2): 200 mg via ORAL
  Filled 2017-11-05 (×4): qty 1

## 2017-11-05 MED ORDER — VANCOMYCIN HCL 10 G IV SOLR
2000.0000 mg | Freq: Once | INTRAVENOUS | Status: DC
  Administered 2017-11-05: 2000 mg via INTRAVENOUS
  Filled 2017-11-05: qty 2000

## 2017-11-05 MED ORDER — ACETAMINOPHEN 325 MG PO TABS
650.0000 mg | ORAL_TABLET | Freq: Four times a day (QID) | ORAL | Status: DC | PRN
  Administered 2017-11-05 – 2017-11-09 (×10): 650 mg via ORAL
  Filled 2017-11-05 (×10): qty 2

## 2017-11-05 MED ORDER — ENTACAPONE 200 MG PO TABS
200.0000 mg | ORAL_TABLET | ORAL | Status: DC
  Administered 2017-11-06 – 2017-11-09 (×30): 200 mg via ORAL
  Filled 2017-11-05 (×35): qty 1

## 2017-11-05 MED ORDER — ONDANSETRON HCL 4 MG/2ML IJ SOLN: 4.0000 mg | Freq: Four times a day (QID) | INTRAMUSCULAR | Status: DC | PRN

## 2017-11-05 MED ORDER — CARBIDOPA-LEVODOPA 25-100 MG PO TABS
1.0000 | ORAL_TABLET | ORAL | Status: DC
  Administered 2017-11-06 – 2017-11-09 (×30): 1 via ORAL
  Filled 2017-11-05 (×30): qty 1

## 2017-11-05 MED ORDER — SODIUM CHLORIDE 0.9 % IV SOLN
2.0000 g | Freq: Once | INTRAVENOUS | Status: AC
  Administered 2017-11-05: 2 g via INTRAVENOUS
  Filled 2017-11-05: qty 2

## 2017-11-05 MED ORDER — CARBIDOPA-LEVODOPA 25-100 MG PO TABS
1.0000 | ORAL_TABLET | ORAL | Status: DC
  Administered 2017-11-05 (×2): 1 via ORAL
  Filled 2017-11-05 (×2): qty 1

## 2017-11-05 MED ORDER — PANTOPRAZOLE SODIUM 40 MG PO TBEC
40.0000 mg | DELAYED_RELEASE_TABLET | Freq: Every day | ORAL | Status: DC
  Administered 2017-11-05 – 2017-11-09 (×5): 40 mg via ORAL
  Filled 2017-11-05 (×5): qty 1

## 2017-11-05 MED ORDER — SODIUM CHLORIDE 0.9 % IV SOLN
1.0000 g | INTRAVENOUS | Status: DC
  Administered 2017-11-05 – 2017-11-07 (×3): 1 g via INTRAVENOUS
  Filled 2017-11-05 (×2): qty 1
  Filled 2017-11-05: qty 10

## 2017-11-05 MED ORDER — ACETAMINOPHEN 650 MG RE SUPP: 650.0000 mg | Freq: Once | RECTAL | Status: DC

## 2017-11-05 MED ORDER — ACETAMINOPHEN 325 MG RE SUPP
325.0000 mg | Freq: Once | RECTAL | Status: AC
  Administered 2017-11-05: 325 mg via RECTAL
  Filled 2017-11-05: qty 1

## 2017-11-05 MED ORDER — CARBIDOPA-LEVODOPA-ENTACAPONE 25-100-200 MG PO TABS: 1.0000 | ORAL_TABLET | ORAL | Status: DC

## 2017-11-05 MED ORDER — IPRATROPIUM-ALBUTEROL 0.5-2.5 (3) MG/3ML IN SOLN
3.0000 mL | Freq: Four times a day (QID) | RESPIRATORY_TRACT | Status: DC | PRN
  Administered 2017-11-06: 3 mL via RESPIRATORY_TRACT
  Filled 2017-11-05: qty 3

## 2017-11-05 NOTE — Telephone Encounter (Signed)
Copied from CRM [id number]. Topic: Quick Communication - See Telephone Encounter >> Nov 05, 2017  3:49 PM Luanna Coleawoud, Jessica L wrote: CRM for notification. See Telephone encounter for: 11/05/17. Lori from [facility] called and stated that she could no see patient today because when she got to the home he patient had fell. Pt wife had called 911 and he is at the hospital. Please advise

## 2017-11-05 NOTE — H&P (Signed)
History and Physical  Eric PREVETTE Greene:[id number] DOB: [birth year] DOA: 11/05/2017  Referring physician: Dr Dayton Scrape  PCP: Kristian Covey, MD  Outpatient Specialists: Neurology Patient coming from: Home  Chief Complaint: Recurrent falls  HPI: [name] is a 77 y.o. male with medical history significant for Parkinson disease, urinary incontinence, chronic anxiety/depression, GERD who presented to ED [facility] due to generalized weakness and recurrent falls.  Patient is a poor historian and reports that weakness has been progressively getting worse in the past 2 weeks.  Endorses a fever at home.  Associated with polyuria.  States symptoms feel like previous UTI.  Patient fell yesterday and went to ED med Center but UA was negative.  Larey Seat again this morning.  States he felt like he was about to fall and loss his balance.  No family members present at the time of this visit.  Denies dizziness, nausea, abdominal pain, chest pain, dyspnea, or palpitations . ED Course: Upon presentation to the ED, vital signs remarkable for T-max 102.9, heart rate 112, respiration rate of 30.  Lab studies significant for leukocytosis with white count of 14 K.  Urine analysis positive for pyuria and hematuria.  TRH asked to admit for sepsis secondary to UTI.  CT head unremarkable for any acute intracranial findings.  Review of Systems: Review of systems as noted in the HPI. All other systems reviewed and are negative.   Past Medical History:  Diagnosis Date  . Fall   . Hypothyroidism   . Morbid obesity (HCC) 07/14/2012  . Obesity   . Parkinson disease (HCC)   . UTI (urinary tract infection)   . Venous stasis    edema   Past Surgical History:  Procedure Laterality Date  . anterior cervical discectomy C6-7    . APPENDECTOMY    . CERVICAL LAMINECTOMY    . FOOT SURGERY  1992   right foot repair    Social History:  reports that he quit smoking about 25 years ago. His smoking use included cigarettes. He quit  after 2.00 years of use. He has never used smokeless tobacco. He reports that he does not drink alcohol or use drugs.   No Known Allergies  Family History  Problem Relation Age of Onset  . Heart disease Mother   . Hyperlipidemia Mother   . Colon cancer Father   . Alcohol abuse Other   . Cancer Sister        breast      Prior to Admission medications   Medication Sig Start Date End Date Taking? Authorizing Provider  carbidopa-levodopa-entacapone (STALEVO) 25-100-200 MG tablet One pill 7 times a day, at 6, 9, 12, 3 PM, 6 PM, 9 PM and 11 PM. Patient taking differently: Take 1 tablet by mouth See admin instructions. One pill 7 times a day, at 6, 9, 12, 3 PM, 6 PM, 9 PM and 11 PM. 07/06/17  Yes Huston Foley, MD  Guaifenesin 1200 MG TB12 Take 1 tablet (1,200 mg total) by mouth 2 (two) times daily. Patient taking differently: Take 1 tablet by mouth 2 (two) times daily as needed (cough, congestion).  09/22/16  Yes Lawyer, Cristal Deer, PA-C  omeprazole (PRILOSEC) 20 MG capsule Take 20 mg by mouth daily.   Yes [provider]  doxycycline (VIBRAMYCIN) 100 MG capsule Take 1 capsule (100 mg total) by mouth 2 (two) times daily. Patient not taking: Reported on 11/05/2017 08/04/17   Charlestine Night, PA-C  escitalopram (LEXAPRO) 10 MG tablet Take 0.5 tablets (5  mg total) by mouth daily. Increase to 1 pill daily after one month. Patient not taking: Reported on 11/05/2017 07/06/17   Huston Foley, MD    Physical Exam: BP (!) 111/96   Pulse (!) 110   Temp (!) 100.8 F (38.2 C) (Rectal)   Resp (!) 21   Wt 104.3 kg   SpO2 96%   BMI 29.53 kg/m   . General: 77 y.o. year-old male well developed well nourished in no acute distress.  Alert and oriented x2. Laceration around left eye. Bruising upper extremities. . Cardiovascular: Regular rate and rhythm with no rubs or gallops.  No thyromegaly or JVD noted.  Trace lower extremity edema. 2/4 pulses in all 4 extremities. Marland Kitchen Respiratory: Clear to  auscultation with no wheezes or rales. Good inspiratory effort. . Abdomen: Soft nontender nondistended with normal bowel sounds x4 quadrants. . Muskuloskeletal: No cyanosis, clubbing or edema noted bilaterally . Neuro: CN II-XII intact, strength, sensation, reflexes . Skin: Bruising noted in upper extremities, facial laceration from fall. Marland Kitchen Psychiatry: Judgement and insight appear normal. Mood is appropriate for condition and setting          Labs on Admission:  Basic Metabolic Panel: Recent Labs  Lab 11/04/17 0755 11/05/17 1547  NA 138 139  K 4.1 3.9  CL 103 107  CO2 35* 24  GLUCOSE 97 92  BUN 25* 25*  CREATININE 0.80 0.73  CALCIUM 8.4* 8.5*   Liver Function Tests: Recent Labs  Lab 11/04/17 0755 11/05/17 1547  AST 20 21  ALT <5 5  ALKPHOS 74 69  BILITOT 1.1 1.1  PROT 7.0 6.6  ALBUMIN 3.8 3.7   No results for input(s): LIPASE, AMYLASE in the last 168 hours. No results for input(s): AMMONIA in the last 168 hours. CBC: Recent Labs  Lab 11/04/17 0722 11/05/17 1434  WBC 5.6 14.6*  NEUTROABS 3.8 14.2*  HGB 14.4 14.3  HCT 42.2 42.5  MCV 87.9 88.9  PLT 144* 166   Cardiac Enzymes: Recent Labs  Lab 11/04/17 0713 11/05/17 1434  TROPONINI <0.03 <0.03    BNP (last 3 results) No results for input(s): BNP in the last 8760 hours.  ProBNP (last 3 results) No results for input(s): PROBNP in the last 8760 hours.  CBG: No results for input(s): GLUCAP in the last 168 hours.  Radiological Exams on Admission: Dg Chest 2 View  Result Date: 11/05/2017 CLINICAL DATA:  Possible sepsis EXAM: CHEST - 2 VIEW COMPARISON:  11/04/2017, 06/28/2017 FINDINGS: Borderline cardiomegaly. Both lungs are clear. Old left fifth rib fracture. Aortic atherosclerosis. Degenerative changes of the spine. IMPRESSION: No active cardiopulmonary disease. Electronically Signed   By: Jasmine Pang M.D.   On: 11/05/2017 14:52   Dg Chest 2 View  Result Date: 11/04/2017 CLINICAL DATA:  Pain  following fall EXAM: CHEST - 2 VIEW COMPARISON:  Jun 28, 2017 FINDINGS: Lungs are clear. Heart size and pulmonary vascularity are normal. No adenopathy. There is aortic atherosclerosis. There is degenerative change in the thoracic spine. No pneumothorax. IMPRESSION: No edema or consolidation. Stable cardiac silhouette. There is aortic atherosclerosis. No evident pneumothorax. Aortic Atherosclerosis (ICD10-I70.0). Electronically Signed   By: Bretta Bang III M.D.   On: 11/04/2017 07:56   Ct Head Wo Contrast  Result Date: 11/05/2017 CLINICAL DATA:  Increased falls in the past week. Fever. History of Parkinson's. EXAM: CT HEAD WITHOUT CONTRAST TECHNIQUE: Contiguous axial images were obtained from the base of the skull through the vertex without intravenous contrast. COMPARISON:  11/04/2017 FINDINGS:  Brain: There is no evidence of acute infarct, intracranial hemorrhage, mass, midline shift, or extra-axial fluid collection. Cerebral atrophy is mild-to-moderate for age. Cerebral white matter hypodensities are unchanged and nonspecific but compatible with mild chronic small vessel ischemic disease. Vascular: Calcified atherosclerosis at the skull base. No hyperdense vessel. Skull: No fracture or focal osseous lesion. Sinuses/Orbits: Visualized paranasal sinuses and mastoid air cells are clear. Bilateral cataract extraction. Other: None. IMPRESSION: 1. No evidence of acute intracranial abnormality. 2. Mild-to-moderate chronic small vessel ischemic disease. Electronically Signed   By: Sebastian Ache M.D.   On: 11/05/2017 16:30   Ct Head Wo Contrast  Result Date: 11/04/2017 CLINICAL DATA:  Pain following fall EXAM: CT HEAD WITHOUT CONTRAST CT MAXILLOFACIAL WITHOUT CONTRAST TECHNIQUE: Multidetector CT imaging of the head and maxillofacial structures were performed using the standard protocol without intravenous contrast. Multiplanar CT image reconstructions of the maxillofacial structures were also generated.  COMPARISON:  Head CT Jun 28, 2017 FINDINGS: CT HEAD FINDINGS Brain: There is age related volume loss, stable. There is no evident mass, hemorrhage, extra-axial fluid collection, or midline shift. Patchy small vessel disease in the centra semiovale bilaterally is stable. No new gray-white compartment lesions are evident. No evident acute infarct. Vascular: There is no hyperdense vessel. There is calcification in each carotid siphon region. There is calcification in the distal right vertebral artery. Skull: Bony calvarium appears intact. There is a left parietal scalp hematoma. Other: Mastoid air cells are clear. There is debris in each external auditory canal, more on the left than on the right. CT MAXILLOFACIAL FINDINGS Osseous: There is no evident fracture or dislocation. No blastic or lytic bone lesions. Orbits: There is preseptal soft tissue swelling over the left orbit. There is no intraorbital lesion. Intraorbital contents appear symmetric bilaterally. Globes bilaterally appear intact. Sinuses: There is slight mucosal thickening in the inferolateral left maxillary antrum. There is mucosal thickening in several ethmoid air cells. Other paranasal sinuses are clear. No air-fluid level. No bony destruction or expansion. Ostiomeatal unit complexes are patent bilaterally. There is leftward deviation of the nasal septum. Nares are not obstructed on either side. Soft tissues: There is soft tissue swelling over the medial upper face and preseptal orbital region. No other soft tissue swelling or hematoma. No abscess. Visualized salivary glands appear symmetric bilaterally. There is no adenopathy. Tongue and tongue base regions appear normal. Visualize pharynx appears normal. There is pannus posterior to the odontoid. Visualized upper cervical spine appears intact. There is left and right carotid artery calcification. IMPRESSION: Head CT: Age related volume loss with patchy supratentorial small vessel disease. No acute  infarct. No mass or hemorrhage. There are foci of arterial vascular calcification. There is a left parietal scalp hematoma. No fracture. There is probable cerumen in the external auditory canals, more severe on the left than on the right. CT maxillofacial: 1.  No evident fracture or dislocation. 2. No intraorbital lesions. There is preseptal soft tissue swelling over the left orbit. 3. Left upper facial swelling. No well-defined hematoma. No abscess. 4. Mucosal thickening in several ethmoid air cells. Slight mucosal thickening in the inferolateral left maxillary antrum. Other paranasal sinuses are clear. Ostiomeatal unit complexes are patent bilaterally. There is leftward deviation of the nasal septum. Electronically Signed   By: Bretta Bang III M.D.   On: 11/04/2017 08:04   Ct Maxillofacial Wo Contrast  Result Date: 11/04/2017 CLINICAL DATA:  Pain following fall EXAM: CT HEAD WITHOUT CONTRAST CT MAXILLOFACIAL WITHOUT CONTRAST TECHNIQUE: Multidetector CT imaging of  the head and maxillofacial structures were performed using the standard protocol without intravenous contrast. Multiplanar CT image reconstructions of the maxillofacial structures were also generated. COMPARISON:  Head CT Jun 28, 2017 FINDINGS: CT HEAD FINDINGS Brain: There is age related volume loss, stable. There is no evident mass, hemorrhage, extra-axial fluid collection, or midline shift. Patchy small vessel disease in the centra semiovale bilaterally is stable. No new gray-white compartment lesions are evident. No evident acute infarct. Vascular: There is no hyperdense vessel. There is calcification in each carotid siphon region. There is calcification in the distal right vertebral artery. Skull: Bony calvarium appears intact. There is a left parietal scalp hematoma. Other: Mastoid air cells are clear. There is debris in each external auditory canal, more on the left than on the right. CT MAXILLOFACIAL FINDINGS Osseous: There is no evident  fracture or dislocation. No blastic or lytic bone lesions. Orbits: There is preseptal soft tissue swelling over the left orbit. There is no intraorbital lesion. Intraorbital contents appear symmetric bilaterally. Globes bilaterally appear intact. Sinuses: There is slight mucosal thickening in the inferolateral left maxillary antrum. There is mucosal thickening in several ethmoid air cells. Other paranasal sinuses are clear. No air-fluid level. No bony destruction or expansion. Ostiomeatal unit complexes are patent bilaterally. There is leftward deviation of the nasal septum. Nares are not obstructed on either side. Soft tissues: There is soft tissue swelling over the medial upper face and preseptal orbital region. No other soft tissue swelling or hematoma. No abscess. Visualized salivary glands appear symmetric bilaterally. There is no adenopathy. Tongue and tongue base regions appear normal. Visualize pharynx appears normal. There is pannus posterior to the odontoid. Visualized upper cervical spine appears intact. There is left and right carotid artery calcification. IMPRESSION: Head CT: Age related volume loss with patchy supratentorial small vessel disease. No acute infarct. No mass or hemorrhage. There are foci of arterial vascular calcification. There is a left parietal scalp hematoma. No fracture. There is probable cerumen in the external auditory canals, more severe on the left than on the right. CT maxillofacial: 1.  No evident fracture or dislocation. 2. No intraorbital lesions. There is preseptal soft tissue swelling over the left orbit. 3. Left upper facial swelling. No well-defined hematoma. No abscess. 4. Mucosal thickening in several ethmoid air cells. Slight mucosal thickening in the inferolateral left maxillary antrum. Other paranasal sinuses are clear. Ostiomeatal unit complexes are patent bilaterally. There is leftward deviation of the nasal septum. Electronically Signed   By: Bretta BangWilliam  Woodruff III  M.D.   On: 11/04/2017 08:04    EKG: I independently viewed the EKG done and my findings are as followed: Sinus tachycardia with occasional PVCs.  Assessment/Plan Present on Admission: . Sepsis (HCC)  Active Problems:   Sepsis (HCC)  Sepsis secondary to UTI versus others Positive UA Polyuria T-max 102.9, heart rate 114, respiration rate 30, WBC 14 Obtain urine culture Obtain blood cultures x2 due to high fever Monitor fever curve Monitor WBC IV ceftriaxone empirically  Chronic cough Self-reported cough has been present for greater than 2 weeks Lung auscultation is essentially benign Chest x-ray personally reviewed with possible atelectasis at right lower lobe Duo nebs every 6 hours as needed Mucinex and hypersaline nebs for pulmonary toilet  Hematuria unclear etiology Obtain renal ultrasound Continue IV fluid Monitor urine output No CKD or AKI  Parkinson disease Continue home medications Follows with neurology outpatient  Ambulatory dysfunction with recurrent falls Could have been exacerbated by UTI PT to assess Fall  precautions  Generalized anxiety/depression Continue escitalopram  GERD Stable Continue PPI     DVT prophylaxis: SCDs, hold off chemical DVT prophylaxis due to hematuria  Code Status: Full code  Family Communication: None at bedside  Disposition Plan: Admit to MedSurg  Consults called: None  Admission status: Observation    Darlin Drop MD [facility] Pager [phone]  If 7PM-7AM, please contact night-coverage [email or web address] Password [facility]  11/05/2017, 6:35 PM

## 2017-11-05 NOTE — Care Management Note (Signed)
Case Management Note  CM [facility] to pt's arrival [facility]  He advised the [facility] RN was on her way to see the pt and start care when a fall was reported to her.  Denyse AmassCorey states they would not [facility] to assess him prior to transporting him to the hospital again.  Denyse AmassCorey will follow for needs.  Demira Gwynne, Lynnae SandhoffAngela N, RN 11/05/2017, 2:07 PM

## 2017-11-05 NOTE — ED Notes (Signed)
Bed: [id number] Expected date:  Expected time:  Means of arrival:  Comments: EMS-possible sepsis

## 2017-11-05 NOTE — ED Triage Notes (Addendum)
Pt arrives via EMS from home. Pts wife reports fever for " a couple days" Pt has hx of parkinsons and has an increase in falls in the last week. Pt received 1000mg  of PO tylenol PTA

## 2017-11-05 NOTE — Progress Notes (Signed)
A consult was received from an ED physician for Vancomycin and Cefepime per pharmacy dosing (for an indication other than meningitis). The patient's profile has been reviewed for ht/wt/allergies/indication/available labs. A one time order has been placed for the above antibiotics.  Further antibiotics/pharmacy consults should be ordered by admitting physician if indicated.                       Bernadene Personrew Trenyce Loera, PharmD, BCPS [phone] 11/05/2017, 3:38 PM

## 2017-11-05 NOTE — ED Notes (Signed)
ED TO INPATIENT HANDOFF REPORT  Name/Age/Gender [name] 77 y.o. male  Code Status Code Status History    Date Active Date Inactive Code Status Order ID Comments User Context   08/10/2014 2003 08/11/2014 1602 Full Code [id number]  Kelvin Cellar, MD Inpatient    Advance Directive Documentation     Most Recent Value  Type of Advance Directive  Healthcare Power of Attorney, Living will  Pre-existing out of facility DNR order (yellow form or pink MOST form)  -  "MOST" Form in Place?  -      Home/SNF/Other Home  Chief Complaint fever  Level of Care/Admitting Diagnosis ED Disposition    ED Disposition Condition [place]: [facility] [[id number]]  Level of Care: Med-Surg [16]  Diagnosis: Sepsis [facility]) [[id number]]  Admitting Physician: Kayleen Memos [[id number]]  Attending Physician: Kayleen Memos [[id number]]  PT Class (Do Not Modify): Observation [104]  PT Acc Code (Do Not Modify): Observation [10022]       Medical History Past Medical History:  Diagnosis Date  . Fall   . Hypothyroidism   . Morbid obesity ([place]) 07/14/2012  . Obesity   . Parkinson disease ([place])   . UTI (urinary tract infection)   . Venous stasis    edema    Allergies No Known Allergies  IV Location/Drains/Wounds Patient Lines/Drains/Airways Status   Active Line/Drains/Airways    Name:   Placement date:   Placement time:   Site:   Days:   Peripheral IV 11/05/17 Left Forearm   11/05/17    -    Forearm   less than 1   Peripheral IV 11/05/17 Right Hand   11/05/17    1441    Hand   less than 1          Labs/Imaging Results for orders placed or performed during the hospital encounter of 11/05/17 (from the past 48 hour(s))  I-Stat CG4 Lactic Acid, ED     Status: None   Collection Time: 11/05/17  2:24 PM  Result Value Ref Range   Lactic Acid, Venous 1.90 0.5 - 1.9 mmol/L  CBC with Differential     Status: Abnormal   Collection Time: 11/05/17  2:34 PM   Result Value Ref Range   WBC 14.6 (H) 4.0 - 10.5 K/uL   RBC 4.78 4.22 - 5.81 MIL/uL   Hemoglobin 14.3 13.0 - 17.0 g/dL   HCT 42.5 39.0 - 52.0 %   MCV 88.9 78.0 - 100.0 fL   MCH 29.9 26.0 - 34.0 pg   MCHC 33.6 30.0 - 36.0 g/dL   RDW 13.4 11.5 - 15.5 %   Platelets 166 150 - 400 K/uL   Neutrophils Relative % 97 %   Neutro Abs 14.2 (H) 1.7 - 7.7 K/uL   Lymphocytes Relative 2 %   Lymphs Abs 0.3 (L) 0.7 - 4.0 K/uL   Monocytes Relative 1 %   Monocytes Absolute 0.1 0.1 - 1.0 K/uL   Eosinophils Relative 0 %   Eosinophils Absolute 0.0 0.0 - 0.7 K/uL   Basophils Relative 0 %   Basophils Absolute 0.0 0.0 - 0.1 K/uL    Comment: Performed at [facility], [place] [address] [place], [place] [address]  Urinalysis, Routine w reflex microscopic     Status: Abnormal   Collection Time: 11/05/17  2:34 PM  Result Value Ref Range   Color, Urine AMBER (A) YELLOW    Comment: BIOCHEMICALS MAY BE AFFECTED  BY COLOR   APPearance CLEAR CLEAR   Specific Gravity, Urine 1.024 1.005 - 1.030   pH 5.0 5.0 - 8.0   Glucose, UA NEGATIVE NEGATIVE mg/dL   Hgb urine dipstick SMALL (A) NEGATIVE   Bilirubin Urine NEGATIVE NEGATIVE   Ketones, ur 20 (A) NEGATIVE mg/dL   Protein, ur NEGATIVE NEGATIVE mg/dL   Nitrite NEGATIVE NEGATIVE   Leukocytes, UA TRACE (A) NEGATIVE   RBC / HPF 21-50 0 - 5 RBC/hpf   WBC, UA 11-20 0 - 5 WBC/hpf   Bacteria, UA MANY (A) NONE SEEN   Squamous Epithelial / LPF 0-5 0 - 5    Comment: Performed at [facility], [place] [address] [place], [place] [address]  Troponin I     Status: None   Collection Time: 11/05/17  2:34 PM  Result Value Ref Range   Troponin I <0.03 <0.03 ng/mL    Comment: Performed at [facility], [place] [address] [place], [place] [address]  Comprehensive metabolic panel     Status: Abnormal   Collection Time: 11/05/17  3:47 PM  Result Value Ref Range   Sodium 139 135 - 145 mmol/L   Potassium 3.9 3.5 - 5.1 mmol/L    Chloride 107 98 - 111 mmol/L   CO2 24 22 - 32 mmol/L   Glucose, Bld 92 70 - 99 mg/dL   BUN 25 (H) 8 - 23 mg/dL   Creatinine, Ser 0.73 0.61 - 1.24 mg/dL   Calcium 8.5 (L) 8.9 - 10.3 mg/dL   Total Protein 6.6 6.5 - 8.1 g/dL   Albumin 3.7 3.5 - 5.0 g/dL   AST 21 15 - 41 U/L   ALT 5 0 - 44 U/L   Alkaline Phosphatase 69 38 - 126 U/L   Total Bilirubin 1.1 0.3 - 1.2 mg/dL   GFR calc non Af Amer >60 >60 mL/min   GFR calc Af Amer >60 >60 mL/min    Comment: (NOTE) The eGFR has been calculated using the CKD EPI equation. This calculation has not been validated in all clinical situations. eGFR's persistently <60 mL/min signify possible Chronic Kidney Disease.    Anion gap 8 5 - 15    Comment: Performed at [facility], [place] [address] [place], [place] [address]  Protime-INR     Status: None   Collection Time: 11/05/17  3:47 PM  Result Value Ref Range   Prothrombin Time 13.5 11.4 - 15.2 seconds   INR 1.04     Comment: Performed at [facility], [place] [address] [place], [place] [address]  Influenza panel by PCR (type A & B)     Status: None   Collection Time: 11/05/17  4:58 PM  Result Value Ref Range   Influenza A By PCR NEGATIVE NEGATIVE   Influenza B By PCR NEGATIVE NEGATIVE    Comment: (NOTE) The Xpert Xpress Flu assay is intended as an aid in the diagnosis of  influenza and should not be used as a sole basis for treatment.  This  assay is FDA approved for nasopharyngeal swab specimens only. Nasal  washings and aspirates are unacceptable for Xpert Xpress Flu testing. Performed at [facility], [place] [address] [place], [place] [address]    Dg Chest 2 View  Result Date: 11/05/2017 CLINICAL DATA:  Possible sepsis EXAM: CHEST - 2 VIEW COMPARISON:  11/04/2017, 06/28/2017 FINDINGS: Borderline cardiomegaly. Both lungs are clear. Old left fifth rib fracture. Aortic atherosclerosis. Degenerative changes of  the spine. IMPRESSION: No  active cardiopulmonary disease. Electronically Signed   By: Donavan Foil M.D.   On: 11/05/2017 14:52   Dg Chest 2 View  Result Date: 11/04/2017 CLINICAL DATA:  Pain following fall EXAM: CHEST - 2 VIEW COMPARISON:  Jun 28, 2017 FINDINGS: Lungs are clear. Heart size and pulmonary vascularity are normal. No adenopathy. There is aortic atherosclerosis. There is degenerative change in the thoracic spine. No pneumothorax. IMPRESSION: No edema or consolidation. Stable cardiac silhouette. There is aortic atherosclerosis. No evident pneumothorax. Aortic Atherosclerosis (ICD10-I70.0). Electronically Signed   By: Lowella Grip III M.D.   On: 11/04/2017 07:56   Ct Head Wo Contrast  Result Date: 11/05/2017 CLINICAL DATA:  Increased falls in the past week. Fever. History of Parkinson's. EXAM: CT HEAD WITHOUT CONTRAST TECHNIQUE: Contiguous axial images were obtained from the base of the skull through the vertex without intravenous contrast. COMPARISON:  11/04/2017 FINDINGS: Brain: There is no evidence of acute infarct, intracranial hemorrhage, mass, midline shift, or extra-axial fluid collection. Cerebral atrophy is mild-to-moderate for age. Cerebral white matter hypodensities are unchanged and nonspecific but compatible with mild chronic small vessel ischemic disease. Vascular: Calcified atherosclerosis at the skull base. No hyperdense vessel. Skull: No fracture or focal osseous lesion. Sinuses/Orbits: Visualized paranasal sinuses and mastoid air cells are clear. Bilateral cataract extraction. Other: None. IMPRESSION: 1. No evidence of acute intracranial abnormality. 2. Mild-to-moderate chronic small vessel ischemic disease. Electronically Signed   By: Logan Bores M.D.   On: 11/05/2017 16:30   Ct Head Wo Contrast  Result Date: 11/04/2017 CLINICAL DATA:  Pain following fall EXAM: CT HEAD WITHOUT CONTRAST CT MAXILLOFACIAL WITHOUT CONTRAST TECHNIQUE: Multidetector CT imaging of the head and maxillofacial structures  were performed using the standard protocol without intravenous contrast. Multiplanar CT image reconstructions of the maxillofacial structures were also generated. COMPARISON:  Head CT Jun 28, 2017 FINDINGS: CT HEAD FINDINGS Brain: There is age related volume loss, stable. There is no evident mass, hemorrhage, extra-axial fluid collection, or midline shift. Patchy small vessel disease in the centra semiovale bilaterally is stable. No new gray-white compartment lesions are evident. No evident acute infarct. Vascular: There is no hyperdense vessel. There is calcification in each carotid siphon region. There is calcification in the distal right vertebral artery. Skull: Bony calvarium appears intact. There is a left parietal scalp hematoma. Other: Mastoid air cells are clear. There is debris in each external auditory canal, more on the left than on the right. CT MAXILLOFACIAL FINDINGS Osseous: There is no evident fracture or dislocation. No blastic or lytic bone lesions. Orbits: There is preseptal soft tissue swelling over the left orbit. There is no intraorbital lesion. Intraorbital contents appear symmetric bilaterally. Globes bilaterally appear intact. Sinuses: There is slight mucosal thickening in the inferolateral left maxillary antrum. There is mucosal thickening in several ethmoid air cells. Other paranasal sinuses are clear. No air-fluid level. No bony destruction or expansion. Ostiomeatal unit complexes are patent bilaterally. There is leftward deviation of the nasal septum. Nares are not obstructed on either side. Soft tissues: There is soft tissue swelling over the medial upper face and preseptal orbital region. No other soft tissue swelling or hematoma. No abscess. Visualized salivary glands appear symmetric bilaterally. There is no adenopathy. Tongue and tongue base regions appear normal. Visualize pharynx appears normal. There is pannus posterior to the odontoid. Visualized upper cervical spine appears  intact. There is left and right carotid artery calcification. IMPRESSION: Head CT: Age related volume loss with patchy supratentorial  small vessel disease. No acute infarct. No mass or hemorrhage. There are foci of arterial vascular calcification. There is a left parietal scalp hematoma. No fracture. There is probable cerumen in the external auditory canals, more severe on the left than on the right. CT maxillofacial: 1.  No evident fracture or dislocation. 2. No intraorbital lesions. There is preseptal soft tissue swelling over the left orbit. 3. Left upper facial swelling. No well-defined hematoma. No abscess. 4. Mucosal thickening in several ethmoid air cells. Slight mucosal thickening in the inferolateral left maxillary antrum. Other paranasal sinuses are clear. Ostiomeatal unit complexes are patent bilaterally. There is leftward deviation of the nasal septum. Electronically Signed   By: Lowella Grip III M.D.   On: 11/04/2017 08:04   Ct Maxillofacial Wo Contrast  Result Date: 11/04/2017 CLINICAL DATA:  Pain following fall EXAM: CT HEAD WITHOUT CONTRAST CT MAXILLOFACIAL WITHOUT CONTRAST TECHNIQUE: Multidetector CT imaging of the head and maxillofacial structures were performed using the standard protocol without intravenous contrast. Multiplanar CT image reconstructions of the maxillofacial structures were also generated. COMPARISON:  Head CT Jun 28, 2017 FINDINGS: CT HEAD FINDINGS Brain: There is age related volume loss, stable. There is no evident mass, hemorrhage, extra-axial fluid collection, or midline shift. Patchy small vessel disease in the centra semiovale bilaterally is stable. No new gray-white compartment lesions are evident. No evident acute infarct. Vascular: There is no hyperdense vessel. There is calcification in each carotid siphon region. There is calcification in the distal right vertebral artery. Skull: Bony calvarium appears intact. There is a left parietal scalp hematoma. Other:  Mastoid air cells are clear. There is debris in each external auditory canal, more on the left than on the right. CT MAXILLOFACIAL FINDINGS Osseous: There is no evident fracture or dislocation. No blastic or lytic bone lesions. Orbits: There is preseptal soft tissue swelling over the left orbit. There is no intraorbital lesion. Intraorbital contents appear symmetric bilaterally. Globes bilaterally appear intact. Sinuses: There is slight mucosal thickening in the inferolateral left maxillary antrum. There is mucosal thickening in several ethmoid air cells. Other paranasal sinuses are clear. No air-fluid level. No bony destruction or expansion. Ostiomeatal unit complexes are patent bilaterally. There is leftward deviation of the nasal septum. Nares are not obstructed on either side. Soft tissues: There is soft tissue swelling over the medial upper face and preseptal orbital region. No other soft tissue swelling or hematoma. No abscess. Visualized salivary glands appear symmetric bilaterally. There is no adenopathy. Tongue and tongue base regions appear normal. Visualize pharynx appears normal. There is pannus posterior to the odontoid. Visualized upper cervical spine appears intact. There is left and right carotid artery calcification. IMPRESSION: Head CT: Age related volume loss with patchy supratentorial small vessel disease. No acute infarct. No mass or hemorrhage. There are foci of arterial vascular calcification. There is a left parietal scalp hematoma. No fracture. There is probable cerumen in the external auditory canals, more severe on the left than on the right. CT maxillofacial: 1.  No evident fracture or dislocation. 2. No intraorbital lesions. There is preseptal soft tissue swelling over the left orbit. 3. Left upper facial swelling. No well-defined hematoma. No abscess. 4. Mucosal thickening in several ethmoid air cells. Slight mucosal thickening in the inferolateral left maxillary antrum. Other paranasal  sinuses are clear. Ostiomeatal unit complexes are patent bilaterally. There is leftward deviation of the nasal septum. Electronically Signed   By: Lowella Grip III M.D.   On: 11/04/2017 08:04  Pending Labs Unresulted Labs (From admission, onward)    Start     Ordered   11/05/17 1413  Culture, blood (Routine x 2)  BLOOD CULTURE X 2,   STAT     11/05/17 1412   11/05/17 1413  Urine culture  STAT,   STAT     11/05/17 1412          Vitals/Pain Today's Vitals   11/05/17 1455 11/05/17 1500 11/05/17 1646 11/05/17 1700  BP:  132/72  115/85  Pulse: (!) 114 (!) 113  (!) 103  Resp: (!) 35 (!) 30  17  Temp:   (!) 100.8 F (38.2 C)   TempSrc:   Rectal   SpO2: 98% 96%  98%  Weight:      PainSc:        Isolation Precautions No active isolations  Medications Medications  metroNIDAZOLE (FLAGYL) IVPB 500 mg ( Intravenous Stopped 11/05/17 1648)  vancomycin (VANCOCIN) 2,000 mg in sodium chloride 0.9 % 500 mL IVPB (2,000 mg Intravenous Transfusing/Transfer 11/05/17 1823)  sodium chloride 0.9 % bolus 2,000 mL (0 mLs Intravenous Stopped 11/05/17 1805)  ceFEPIme (MAXIPIME) 2 g in sodium chloride 0.9 % 100 mL IVPB ( Intravenous Stopped 11/05/17 1717)  acetaminophen (TYLENOL) suppository 325 mg (325 mg Rectal Given 11/05/17 1803)    Mobility Walks however has had frequent falls and is currently weak

## 2017-11-05 NOTE — ED Provider Notes (Addendum)
[facility]-EMERGENCY DEPT Provider Note   CSN: [id number] Arrival date & time: 11/05/17  1345     History   Chief Complaint Chief Complaint  Patient presents with  . Fever    HPI [name] is a 77 y.o. male.  HPI   Patient is a 77 year old male with a history of Parkinson's disease, hypothyroidism, and obesity presenting for fever and altered mental status.  Patient presents with his wife who assist in history taking.  Patient's wife provides most of the history.  She reports that over the past 7 to 10 days, he has had some progressive decline in functional status with more frequent falls.  Patient sustained a fall with laceration to the face and forehead yesterday, which was repaired in the emergency department.  She reports that he was not febrile at that time, and fall appear to be purely mechanical syncope.  This was reviewed and record.  Patient also had no evidence of urinary tract infection, or infiltrate on chest x-ray.  She reports that today, patient ambulated to the bathroom, 8 this morning, but over 3-hour.  That she left him alone, when she returned, he appeared to be extremely weak.  Patient's wife denies any rashes, patches of erythema on the skin, increased cough above baseline, sputum production.  Patient denies any abdominal pain, nausea, vomiting, diarrhea, chest pain, shortness of breath, dysuria.  Patient was catheterized yesterday.  Patient's wife does note that patient has poor dentition at baseline, and had a dental infection in 2018.  He also needs to get a molar removed, but it has not been removed yet.  Past Medical History:  Diagnosis Date  . Fall   . Hypothyroidism   . Morbid obesity (HCC) 07/14/2012  . Obesity   . Parkinson disease (HCC)   . UTI (urinary tract infection)   . Venous stasis    edema    Patient Active Problem List   Diagnosis Date Noted  . Recurrent falls 08/10/2014  . Falls 08/10/2014  . Weakness   .  Parkinson disease (HCC)   . Morbid obesity (HCC) 07/14/2012  . WEAKNESS 01/30/2010  . URINARY INCONTINENCE 01/30/2010  . DYSURIA 11/13/2009  . PARKINSON'S DISEASE 01/29/2009    Past Surgical History:  Procedure Laterality Date  . anterior cervical discectomy C6-7    . APPENDECTOMY    . CERVICAL LAMINECTOMY    . FOOT SURGERY  1992   right foot repair        Home Medications    Prior to Admission medications   Medication Sig Start Date End Date Taking? Authorizing Provider  carbidopa-levodopa-entacapone (STALEVO) 25-100-200 MG tablet One pill 7 times a day, at 6, 9, 12, 3 PM, 6 PM, 9 PM and 11 PM. Patient taking differently: Take 1 tablet by mouth See admin instructions. One pill 7 times a day, at 6, 9, 12, 3 PM, 6 PM, 9 PM and 11 PM. 07/06/17  Yes Huston Foley, MD  Guaifenesin 1200 MG TB12 Take 1 tablet (1,200 mg total) by mouth 2 (two) times daily. Patient taking differently: Take 1 tablet by mouth 2 (two) times daily as needed (cough, congestion).  09/22/16  Yes Lawyer, Cristal Deer, PA-C  omeprazole (PRILOSEC) 20 MG capsule Take 20 mg by mouth daily.   Yes [provider]  doxycycline (VIBRAMYCIN) 100 MG capsule Take 1 capsule (100 mg total) by mouth 2 (two) times daily. Patient not taking: Reported on 11/05/2017 08/04/17   Charlestine Night, PA-C  escitalopram (  LEXAPRO) 10 MG tablet Take 0.5 tablets (5 mg total) by mouth daily. Increase to 1 pill daily after one month. Patient not taking: Reported on 11/05/2017 07/06/17   Huston FoleyAthar, Saima, MD    Family History Family History  Problem Relation Age of Onset  . Heart disease Mother   . Hyperlipidemia Mother   . Colon cancer Father   . Alcohol abuse Other   . Cancer Sister        breast     Social History Social History   Tobacco Use  . Smoking status: Former Smoker    Years: 2.00    Types: Cigarettes    Last attempt to quit: 03/26/1992    Years since quitting: 25.6  . Smokeless tobacco: Never Used  Substance Use  Topics  . Alcohol use: No    Alcohol/week: 0.0 standard drinks  . Drug use: No     Allergies   Patient has no known allergies.   Review of Systems Review of Systems  Constitutional: Positive for chills, diaphoresis and fever.  HENT: Negative for congestion, rhinorrhea and sore throat.   Eyes: Negative for visual disturbance.  Respiratory: Positive for cough. Negative for chest tightness and shortness of breath.   Cardiovascular: Negative for chest pain, palpitations and leg swelling.  Gastrointestinal: Negative for abdominal pain, constipation, diarrhea, nausea and vomiting.  Genitourinary: Positive for hematuria. Negative for dysuria, flank pain, scrotal swelling and testicular pain.  Musculoskeletal: Negative for back pain and myalgias.  Skin: Negative for color change, rash and wound.  Neurological: Positive for light-headedness. Negative for dizziness, syncope and headaches.  Psychiatric/Behavioral: Positive for confusion.     Physical Exam Updated Vital Signs BP 132/72   Pulse (!) 113   Temp (!) 102.9 F (39.4 C) (Rectal)   Resp (!) 30   Wt 104.3 kg   SpO2 96%   BMI 29.53 kg/m   Physical Exam  Constitutional: He appears well-developed and well-nourished.  Chronically ill-appearing, appearing older than stated age.  Easily arousable, but appears sleepy.  HENT:  Head: Normocephalic. Head is with Battle's sign.    Dental cavities and poor oral dentition noted. Eroded right upper molars. No abscess noted. Midline uvula. No trismus. OP clear and moist. No oropharyngeal erythema or edema. Neck supple with no tenderness. No facial edema.   Eyes: Pupils are equal, round, and reactive to light. Conjunctivae and EOM are normal.  Neck: Normal range of motion. Neck supple.  No nuchal rigidity or meningismus.   Cardiovascular: Normal rate, S1 normal and S2 normal.  No murmur heard. Tahcycardic.   Pulmonary/Chest: Effort normal and breath sounds normal. He has no wheezes.  He has no rales.  Abdominal: Soft. Bowel sounds are normal. He exhibits no distension. There is no tenderness. There is no guarding.  Genitourinary:  Genitourinary Comments: Mild erythema surrounding sacrum.  No crepitus. No scrotal swelling or erythema.  Musculoskeletal: Normal range of motion. He exhibits no edema or deformity.  Lymphadenopathy:    He has no cervical adenopathy.  Neurological: He is alert.  Cranial nerves grossly intact. Patient moves extremities symmetrically and with good coordination.  Skin: Skin is warm and dry. No rash noted.  Multiple ecchymoses over bilateral upper and lower extremities. Patient has no rashes of palms or soles, no splinter hemorrhaging.   Psychiatric: He has a normal mood and affect. His behavior is normal.  Nursing note and vitals reviewed.    ED Treatments / Results  Labs (all labs ordered are listed, but  only abnormal results are displayed) Labs Reviewed  CBC WITH DIFFERENTIAL/PLATELET - Abnormal; Notable for the following components:      Result Value   WBC 14.6 (*)    Neutro Abs 14.2 (*)    Lymphs Abs 0.3 (*)    All other components within normal limits  URINALYSIS, ROUTINE W REFLEX MICROSCOPIC - Abnormal; Notable for the following components:   Color, Urine AMBER (*)    Hgb urine dipstick SMALL (*)    Ketones, ur 20 (*)    Leukocytes, UA TRACE (*)    Bacteria, UA MANY (*)    All other components within normal limits  CULTURE, BLOOD (ROUTINE X 2)  CULTURE, BLOOD (ROUTINE X 2)  URINE CULTURE  TROPONIN I  COMPREHENSIVE METABOLIC PANEL  PROTIME-INR  I-STAT CG4 LACTIC ACID, ED  I-STAT CG4 LACTIC ACID, ED    EKG None  Radiology Dg Chest 2 View  Result Date: 11/05/2017 CLINICAL DATA:  Possible sepsis EXAM: CHEST - 2 VIEW COMPARISON:  11/04/2017, 06/28/2017 FINDINGS: Borderline cardiomegaly. Both lungs are clear. Old left fifth rib fracture. Aortic atherosclerosis. Degenerative changes of the spine. IMPRESSION: No active  cardiopulmonary disease. Electronically Signed   By: Jasmine Pang M.D.   On: 11/05/2017 14:52   Dg Chest 2 View  Result Date: 11/04/2017 CLINICAL DATA:  Pain following fall EXAM: CHEST - 2 VIEW COMPARISON:  Jun 28, 2017 FINDINGS: Lungs are clear. Heart size and pulmonary vascularity are normal. No adenopathy. There is aortic atherosclerosis. There is degenerative change in the thoracic spine. No pneumothorax. IMPRESSION: No edema or consolidation. Stable cardiac silhouette. There is aortic atherosclerosis. No evident pneumothorax. Aortic Atherosclerosis (ICD10-I70.0). Electronically Signed   By: Bretta Bang III M.D.   On: 11/04/2017 07:56   Ct Head Wo Contrast  Result Date: 11/04/2017 CLINICAL DATA:  Pain following fall EXAM: CT HEAD WITHOUT CONTRAST CT MAXILLOFACIAL WITHOUT CONTRAST TECHNIQUE: Multidetector CT imaging of the head and maxillofacial structures were performed using the standard protocol without intravenous contrast. Multiplanar CT image reconstructions of the maxillofacial structures were also generated. COMPARISON:  Head CT Jun 28, 2017 FINDINGS: CT HEAD FINDINGS Brain: There is age related volume loss, stable. There is no evident mass, hemorrhage, extra-axial fluid collection, or midline shift. Patchy small vessel disease in the centra semiovale bilaterally is stable. No new gray-white compartment lesions are evident. No evident acute infarct. Vascular: There is no hyperdense vessel. There is calcification in each carotid siphon region. There is calcification in the distal right vertebral artery. Skull: Bony calvarium appears intact. There is a left parietal scalp hematoma. Other: Mastoid air cells are clear. There is debris in each external auditory canal, more on the left than on the right. CT MAXILLOFACIAL FINDINGS Osseous: There is no evident fracture or dislocation. No blastic or lytic bone lesions. Orbits: There is preseptal soft tissue swelling over the left orbit. There is no  intraorbital lesion. Intraorbital contents appear symmetric bilaterally. Globes bilaterally appear intact. Sinuses: There is slight mucosal thickening in the inferolateral left maxillary antrum. There is mucosal thickening in several ethmoid air cells. Other paranasal sinuses are clear. No air-fluid level. No bony destruction or expansion. Ostiomeatal unit complexes are patent bilaterally. There is leftward deviation of the nasal septum. Nares are not obstructed on either side. Soft tissues: There is soft tissue swelling over the medial upper face and preseptal orbital region. No other soft tissue swelling or hematoma. No abscess. Visualized salivary glands appear symmetric bilaterally. There is no adenopathy. Tongue and  tongue base regions appear normal. Visualize pharynx appears normal. There is pannus posterior to the odontoid. Visualized upper cervical spine appears intact. There is left and right carotid artery calcification. IMPRESSION: Head CT: Age related volume loss with patchy supratentorial small vessel disease. No acute infarct. No mass or hemorrhage. There are foci of arterial vascular calcification. There is a left parietal scalp hematoma. No fracture. There is probable cerumen in the external auditory canals, more severe on the left than on the right. CT maxillofacial: 1.  No evident fracture or dislocation. 2. No intraorbital lesions. There is preseptal soft tissue swelling over the left orbit. 3. Left upper facial swelling. No well-defined hematoma. No abscess. 4. Mucosal thickening in several ethmoid air cells. Slight mucosal thickening in the inferolateral left maxillary antrum. Other paranasal sinuses are clear. Ostiomeatal unit complexes are patent bilaterally. There is leftward deviation of the nasal septum. Electronically Signed   By: Bretta Bang III M.D.   On: 11/04/2017 08:04   Ct Maxillofacial Wo Contrast  Result Date: 11/04/2017 CLINICAL DATA:  Pain following fall EXAM: CT HEAD  WITHOUT CONTRAST CT MAXILLOFACIAL WITHOUT CONTRAST TECHNIQUE: Multidetector CT imaging of the head and maxillofacial structures were performed using the standard protocol without intravenous contrast. Multiplanar CT image reconstructions of the maxillofacial structures were also generated. COMPARISON:  Head CT Jun 28, 2017 FINDINGS: CT HEAD FINDINGS Brain: There is age related volume loss, stable. There is no evident mass, hemorrhage, extra-axial fluid collection, or midline shift. Patchy small vessel disease in the centra semiovale bilaterally is stable. No new gray-white compartment lesions are evident. No evident acute infarct. Vascular: There is no hyperdense vessel. There is calcification in each carotid siphon region. There is calcification in the distal right vertebral artery. Skull: Bony calvarium appears intact. There is a left parietal scalp hematoma. Other: Mastoid air cells are clear. There is debris in each external auditory canal, more on the left than on the right. CT MAXILLOFACIAL FINDINGS Osseous: There is no evident fracture or dislocation. No blastic or lytic bone lesions. Orbits: There is preseptal soft tissue swelling over the left orbit. There is no intraorbital lesion. Intraorbital contents appear symmetric bilaterally. Globes bilaterally appear intact. Sinuses: There is slight mucosal thickening in the inferolateral left maxillary antrum. There is mucosal thickening in several ethmoid air cells. Other paranasal sinuses are clear. No air-fluid level. No bony destruction or expansion. Ostiomeatal unit complexes are patent bilaterally. There is leftward deviation of the nasal septum. Nares are not obstructed on either side. Soft tissues: There is soft tissue swelling over the medial upper face and preseptal orbital region. No other soft tissue swelling or hematoma. No abscess. Visualized salivary glands appear symmetric bilaterally. There is no adenopathy. Tongue and tongue base regions appear  normal. Visualize pharynx appears normal. There is pannus posterior to the odontoid. Visualized upper cervical spine appears intact. There is left and right carotid artery calcification. IMPRESSION: Head CT: Age related volume loss with patchy supratentorial small vessel disease. No acute infarct. No mass or hemorrhage. There are foci of arterial vascular calcification. There is a left parietal scalp hematoma. No fracture. There is probable cerumen in the external auditory canals, more severe on the left than on the right. CT maxillofacial: 1.  No evident fracture or dislocation. 2. No intraorbital lesions. There is preseptal soft tissue swelling over the left orbit. 3. Left upper facial swelling. No well-defined hematoma. No abscess. 4. Mucosal thickening in several ethmoid air cells. Slight mucosal thickening in the inferolateral  left maxillary antrum. Other paranasal sinuses are clear. Ostiomeatal unit complexes are patent bilaterally. There is leftward deviation of the nasal septum. Electronically Signed   By: Bretta Bang III M.D.   On: 11/04/2017 08:04    Procedures Procedures (including critical care time) CRITICAL CARE Performed by: Elisha Ponder   Total critical care time: 35 minutes  Critical care time was exclusive of separately billable procedures and treating other patients.  Critical care was necessary to treat or prevent imminent or life-threatening deterioration.  Critical care was time spent personally by me on the following activities: development of treatment plan with patient and/or surrogate as well as nursing, discussions with consultants, evaluation of patient's response to treatment, examination of patient, obtaining history from patient or surrogate, ordering and performing treatments and interventions, ordering and review of laboratory studies, ordering and review of radiographic studies, pulse oximetry and re-evaluation of patient's condition.   Medications Ordered  in ED Medications  sodium chloride 0.9 % bolus 2,000 mL (2,000 mLs Intravenous New Bag/Given 11/05/17 1543)  metroNIDAZOLE (FLAGYL) IVPB 500 mg (500 mg Intravenous New Bag/Given 11/05/17 1548)  vancomycin (VANCOCIN) 2,000 mg in sodium chloride 0.9 % 500 mL IVPB (has no administration in time range)  ceFEPIme (MAXIPIME) 2 g in sodium chloride 0.9 % 100 mL IVPB (has no administration in time range)  acetaminophen (TYLENOL) suppository 650 mg (has no administration in time range)     Initial Impression / Assessment and Plan / ED Course  I have reviewed the triage vital signs and the nursing notes.  Pertinent labs & imaging results that were available during my care of the patient were reviewed by me and considered in my medical decision making (see chart for details).  Clinical Course as of Nov 05 1698  Thu Nov 05, 2017  1657 Will order additional 325 mg APAP. Pt received Tylenol via EMS.  Temp(!): 100.8 F (38.2 C) [AM]    Clinical Course User Index [AM] Elisha Ponder, PA-C    Patient is critically ill and requiring a higher level of care. Sepsis suspected. Code sepsis called. Patient meeting SIRS criteria based on fever, tachycardia, respiratory rate, and leukocytosis. See vitals below. Suspected source of infection unknown but differential includes early pneumonia, urinary, abdominal, endocarditis, influenza, other viral infection. Lactic acid normal. Signs of end organ dysfunction include slightly elevated BUN.  Do not suspect meningitis, as patient has no nuchal rigidity, headache, nor rashes.  Patient does have poor dentition, and considered endocarditis, but no murmur, or secondary signs of endocarditis. Flu swab in pending.  Vitals:   11/05/17 1453 11/05/17 1454 11/05/17 1455 11/05/17 1500  BP:    132/72  Pulse: (!) 116 (!) 114 (!) 114 (!) 113  Resp: (!) 34 (!) 30 (!) 35 (!) 30  Temp:      TempSrc:      SpO2: 96% 99% 98% 96%  Weight:         Broad spectrum antibiotics  initiated based on unknown source of infection. Alternative fluid bolus administered due to multiple normotensive blood pressures, and not elevated lactic acid.  Sepsis - Repeat Assessment  Performed at:    5:09 PM  Vitals     Blood pressure 132/72, pulse (!) 113, temperature (!) 100.8 F (38.2 C), temperature source Rectal, resp. rate (!) 30, weight 104.3 kg, SpO2 96 %.  Heart:     Tachycardic  Lungs:    CTA  Capillary Refill:   <2 sec  Peripheral Pulse:  Dorsalis pedis pulse  palpable  Skin:     Normal Color  Spoke with hospitalist Dr. Dow Adolph who will admit patient.  Appreciate her involvement in the care of this patient.  Final Clinical Impressions(s) / ED Diagnoses   Final diagnoses:  Fever in adult  Sepsis, due to unspecified organism [facility])    ED Discharge Orders    None       Delia Chimes 11/05/17 Emilee Hero, MD 11/06/17 1714    Elisha Ponder, PA-C 11/23/17 Daiva Nakayama, MD 11/23/17 [phone]

## 2017-11-06 ENCOUNTER — Observation Stay (HOSPITAL_COMMUNITY): Payer: PPO

## 2017-11-06 DIAGNOSIS — R319 Hematuria, unspecified: Secondary | ICD-10-CM | POA: Diagnosis not present

## 2017-11-06 LAB — BASIC METABOLIC PANEL
ANION GAP: 6 (ref 5–15)
BUN: 19 mg/dL (ref 8–23)
CALCIUM: 7.8 mg/dL — AB (ref 8.9–10.3)
CHLORIDE: 109 mmol/L (ref 98–111)
CO2: 23 mmol/L (ref 22–32)
Creatinine, Ser: 0.77 mg/dL (ref 0.61–1.24)
GFR calc non Af Amer: >60 mL/min (ref >60)
GLUCOSE: 92 mg/dL (ref 70–99)
POTASSIUM: 3.7 mmol/L (ref 3.5–5.1)
Sodium: 138 mmol/L (ref 135–145)

## 2017-11-06 LAB — CG4 I-STAT (LACTIC ACID): LACTIC ACID, VENOUS: 1.23 mmol/L (ref 0.5–1.9)

## 2017-11-06 LAB — CBC
HEMATOCRIT: 36.5 % — AB (ref 39.0–52.0)
HEMOGLOBIN: 12.1 g/dL — AB (ref 13.0–17.0)
MCH: 29.7 pg (ref 26.0–34.0)
MCHC: 33.2 g/dL (ref 30.0–36.0)
MCV: 89.5 fL (ref 78.0–100.0)
Platelets: 123 10*3/uL — ABNORMAL LOW (ref 150–400)
RBC: 4.08 MIL/uL — AB (ref 4.22–5.81)
RDW: 13.5 % (ref 11.5–15.5)
WBC: 21.7 10*3/uL — AB (ref 4.0–10.5)

## 2017-11-06 LAB — MAGNESIUM: MAGNESIUM: 1.5 mg/dL — AB (ref 1.7–2.4)

## 2017-11-06 MED ORDER — MAGNESIUM SULFATE 4 GM/100ML IV SOLN
4.0000 g | Freq: Once | INTRAVENOUS | Status: AC
  Administered 2017-11-06: 4 g via INTRAVENOUS
  Filled 2017-11-06: qty 100

## 2017-11-06 NOTE — Telephone Encounter (Signed)
noted 

## 2017-11-06 NOTE — Telephone Encounter (Signed)
ok 

## 2017-11-06 NOTE — Progress Notes (Signed)
PROGRESS NOTE  [name] [name] DOB: May 08, 1940 DOA: 11/05/2017 PCP: Eric Covey, MD  HPI/Recap of past 24 hours: [name] is a 77 y.o. male with medical history significant for Parkinson disease, urinary incontinence, chronic anxiety/depression, GERD who presented to ED [facility] due to generalized weakness and recurrent falls.  Patient is a poor historian and reports that weakness has been progressively getting worse in the past 2 weeks.  Endorses a fever at home.  Associated with polyuria.  States symptoms feel like previous UTI.  Patient fell yesterday and went to ED med Center but UA was negative.  [name] again this morning.  States he felt like he was about to fall and loss his balance.  No family members present at the time of this visit.  Denies dizziness, nausea, abdominal pain, chest pain, dyspnea, or palpitations.  11/06/2017: Patient seen and examined at bedside.  He appears weak and reports generalized weakness.  No chest pain or dyspnea.  Assessed by PT with recommendation for SNF and 24-hour supervision/assistance.  Assessment/Plan: Active Problems:   Sepsis (HCC)   Sepsis secondary to complicated UTI Urine culture revealed greater than 100,000 colonies of gram-negative rods Continue IV ceftriaxone empirically Will narrow down antibiotics once leukocytosis improves and culture specificity return Blood culture no growth to date x2 Continue to monitor fever curve-fever on presentation with T-max 102.9 Continue to monitor WBC-worsening leukocytosis from 14 K to 21K Monitor urine output Obtain CBC in the morning  Worsening leukocytosis WBC on presentation 14 K which increased to 21K today 11/06/2017 Continue IV antibiotics until WBC starts to trend down and until we have results of urine culture specificity to narrow down antibiotics Continue to monitor WBC Obtain CBC in the morning  Gross hematuria  Per the patient's wife patient had a in and out Foley cath  placed in 2 days ago which caused him to bleed from his urethra. Continue gentle IV fluid Continue to monitor urine output No CKD or AKI  Chronic cough Treat symptomatically No sign of active infective process On duo nebs every 6 hours as needed Home Mucinex and hypersaline nebs for pulmonary toilet  Parkinson disease continue home medications Follows with neurology outpatient Fall precautions  Ambulatory dysfunction with recurrent falls/physical debility/generalized weakness Continue to work with PT PT recommend SNF and 24-hour supervision/assistance due to gait unsteadiness and balance issues   Generalized weakness/anxiety never filled his prescription for escitalopram Discontinue escitalopram  GERD Continue PPI  Risks: Patient is unsafe discharge to his home today 11/06/2017 due to worsening leukocytosis in the setting of urosepsis, high risk for fall from urosepsis and possibly bleeding from falls in an elderly patient with Parkinson's disease.    Would recommend continue IV antibiotics and physical therapy until clinically improved.  Would recommend an additional 1-2 midnights to continue IV antibiotics, IV hydration, and gait training.  Code Status: Full code  Family Communication: Spoke to wife on the phone who works as an NP for dialysis center- [name] can be reached at [phone].  Disposition Plan: Possibly SNF in 1 to 2 days.   Consultants:  None  Procedures:  None  Antimicrobials:  Rocephin  DVT prophylaxis: Subcu Lovenox   Objective: Vitals:   11/06/17 0326 11/06/17 0530 11/06/17 0536 11/06/17 1013  BP:  130/86 109/67   Pulse:  83 77   Resp:   16   Temp:  98.7 F (37.1 C) 98.8 F (37.1 C)   TempSrc:  Oral Oral  SpO2:  96% 96% 97%  Weight: 113.5 kg     Height:        Intake/Output Summary (Last 24 hours) at 11/06/2017 1452 Last data filed at 11/06/2017 0900 Gross per 24 hour  Intake 3088.11 ml  Output 400 ml  Net 2688.11 ml    Filed Weights   11/05/17 1409 11/05/17 1939 11/06/17 0326  Weight: 104.3 kg 113.5 kg 113.5 kg    Exam:  . General: 77 y.o. year-old male well-developed well-nourished in no acute distress.  Alert and oriented x2. . Cardiovascular: Regular rate and rhythm with no rubs or gallops.  No thyromegaly or JVD noted. Marland Kitchen Respiratory: Clear to auscultation with no wheezes or rales. Good inspiratory effort. . Abdomen: Soft nontender nondistended with normal bowel sounds x4 quadrants. . Musculoskeletal: Trace lower extremity edema. 2/4 pulses in all 4 extremities. . Skin: Bruising affected upper extremities bilaterally and laceration affecting left side of his face. Marland Kitchen Psychiatry: Mood is appropriate for condition and setting   Data Reviewed: CBC: Recent Labs  Lab 11/04/17 0722 11/05/17 1434 11/06/17 0431  WBC 5.6 14.6* 21.7*  NEUTROABS 3.8 14.2*  --   HGB 14.4 14.3 12.1*  HCT 42.2 42.5 36.5*  MCV 87.9 88.9 89.5  PLT 144* 166 123*   Basic Metabolic Panel: Recent Labs  Lab 11/04/17 0755 11/05/17 1547 11/06/17 0431  NA 138 139 138  K 4.1 3.9 3.7  CL 103 107 109  CO2 35* 24 23  GLUCOSE 97 92 92  BUN 25* 25* 19  CREATININE 0.80 0.73 0.77  CALCIUM 8.4* 8.5* 7.8*  MG  --   --  1.5*   GFR: Estimated Creatinine Clearance: 105.2 mL/min (by C-G formula based on SCr of 0.77 mg/dL). Liver Function Tests: Recent Labs  Lab 11/04/17 0755 11/05/17 1547  AST 20 21  ALT <5 5  ALKPHOS 74 69  BILITOT 1.1 1.1  PROT 7.0 6.6  ALBUMIN 3.8 3.7   No results for input(s): LIPASE, AMYLASE in the last 168 hours. No results for input(s): AMMONIA in the last 168 hours. Coagulation Profile: Recent Labs  Lab 11/05/17 1547  INR 1.04   Cardiac Enzymes: Recent Labs  Lab 11/04/17 0713 11/05/17 1434  TROPONINI <0.03 <0.03   BNP (last 3 results) No results for input(s): PROBNP in the last 8760 hours. HbA1C: No results for input(s): HGBA1C in the last 72 hours. CBG: No results for  input(s): GLUCAP in the last 168 hours. Lipid Profile: No results for input(s): CHOL, HDL, LDLCALC, TRIG, CHOLHDL, LDLDIRECT in the last 72 hours. Thyroid Function Tests: No results for input(s): TSH, T4TOTAL, FREET4, T3FREE, THYROIDAB in the last 72 hours. Anemia Panel: No results for input(s): VITAMINB12, FOLATE, FERRITIN, TIBC, IRON, RETICCTPCT in the last 72 hours. Urine analysis:    Component Value Date/Time   COLORURINE AMBER (A) 11/05/2017 1434   APPEARANCEUR CLEAR 11/05/2017 1434   LABSPEC 1.024 11/05/2017 1434   PHURINE 5.0 11/05/2017 1434   GLUCOSEU NEGATIVE 11/05/2017 1434   HGBUR SMALL (A) 11/05/2017 1434   BILIRUBINUR NEGATIVE 11/05/2017 1434   BILIRUBINUR 1+ 05/08/2015 1039   KETONESUR 20 (A) 11/05/2017 1434   PROTEINUR NEGATIVE 11/05/2017 1434   UROBILINOGEN 0.2 05/08/2015 1039   UROBILINOGEN 0.2 08/10/2014 1720   NITRITE NEGATIVE 11/05/2017 1434   LEUKOCYTESUR TRACE (A) 11/05/2017 1434   Sepsis Labs: @LABRCNTIP (procalcitonin:4,lacticidven:4)  ) Recent Results (from the past 240 hour(s))  Culture, blood (Routine x 2)     Status: None (Preliminary result)  Collection Time: 11/05/17  2:34 PM  Result Value Ref Range Status   Specimen Description   Final    BLOOD RIGHT HAND Performed at [facility], [address] [place], [place] [address]    Special Requests   Final    BOTTLES DRAWN AEROBIC AND ANAEROBIC Blood Culture adequate volume Performed at [facility], [address] [place], [place] [address]    Culture   Final    NO GROWTH < 12 HOURS Performed at [facility], [address] [place], [place] [address]    Report Status PENDING  Incomplete  Culture, blood (Routine x 2)     Status: None (Preliminary result)   Collection Time: 11/05/17  2:34 PM  Result Value Ref Range Status   Specimen Description   Final    BLOOD BLOOD LEFT FOREARM Performed at [facility], [address]  [place], [place] [address]    Special Requests   Final    BOTTLES DRAWN AEROBIC AND ANAEROBIC Blood Culture adequate volume Performed at [facility], [address] [place], [place] [address]    Culture   Final    NO GROWTH < 12 HOURS Performed at [facility], [address] [place], [place] [address]    Report Status PENDING  Incomplete  Urine culture     Status: Abnormal (Preliminary result)   Collection Time: 11/05/17  2:34 PM  Result Value Ref Range Status   Specimen Description   Final    URINE, CATHETERIZED Performed at [facility], [address] [place], [place] [address]    Special Requests   Final    NONE Performed at [facility], [address] [place], [place] [address]    Culture >=100,000 COLONIES/mL GRAM NEGATIVE RODS (A)  Final   Report Status PENDING  Incomplete      Studies: Ct Head Wo Contrast  Result Date: 11/05/2017 CLINICAL DATA:  Increased falls in the past week. Fever. History of Parkinson's. EXAM: CT HEAD WITHOUT CONTRAST TECHNIQUE: Contiguous axial images were obtained from the base of the skull through the vertex without intravenous contrast. COMPARISON:  11/04/2017 FINDINGS: Brain: There is no evidence of acute infarct, intracranial hemorrhage, mass, midline shift, or extra-axial fluid collection. Cerebral atrophy is mild-to-moderate for age. Cerebral white matter hypodensities are unchanged and nonspecific but compatible with mild chronic small vessel ischemic disease. Vascular: Calcified atherosclerosis at the skull base. No hyperdense vessel. Skull: No fracture or focal osseous lesion. Sinuses/Orbits: Visualized paranasal sinuses and mastoid air cells are clear. Bilateral cataract extraction. Other: None. IMPRESSION: 1. No evidence of acute intracranial abnormality. 2. Mild-to-moderate chronic small vessel ischemic disease. Electronically Signed   By: Sebastian AcheAllen  Grady M.D.   On: 11/05/2017 16:30      Scheduled Meds: . carbidopa-levodopa  1 tablet Oral 8 times per day   And  . entacapone  200 mg Oral 8 times per day  . guaiFENesin  1,200 mg Oral BID  . pantoprazole  40 mg Oral Daily  . sodium chloride HYPERTONIC  4 mL Nebulization TID    Continuous Infusions: . sodium chloride 75 mL/hr at 11/06/17 1001  . cefTRIAXone (ROCEPHIN)  IV Stopped (11/05/17 2314)     LOS: 0 days     Darlin Droparole N Phi Avans, MD [facility] Pager [phone]  If 7PM-7AM, please contact night-coverage [email or web address] Password [email or web address] 11/06/2017, 2:52 PM

## 2017-11-06 NOTE — Evaluation (Signed)
Physical Therapy Evaluation Patient Details Name: [name] MRN: [id number] DOB: 31-Dec-1940 Today's Date: 11/06/2017   History of Present Illness  [name] is a 77 y.o. male with medical history significant for Parkinson disease, urinary incontinence, chronic anxiety/depression, GERD who presented to ED [facility] due to generalized weakness and recurrent falls  Clinical Impression  Pt admitted with above diagnosis. Pt currently with functional limitations due to the deficits listed below (see PT Problem List). Pt lethargic but arouses at time of eval, activity limited to EOB and lateral scooting with +2 assist (wife assisting with mobility -she is a RN,NP); given current functional status and multiple recent falls recommend SNF to maximize independence and safety prior to return home;   Pt will benefit from skilled PT to increase their independence and safety with mobility to allow discharge to the venue listed below.       Follow Up Recommendations SNF;Supervision/Assistance - 24 hour    Equipment Recommendations  None recommended by PT    Recommendations for Other Services       Precautions / Restrictions Precautions Precautions: Fall Precaution Comments: pt has had multiple falls in the last week (at least 6, 2 with absent protective reactions where he fell onto his face) Restrictions Weight Bearing Restrictions: No      Mobility  Bed Mobility Overal bed mobility: Needs Assistance Bed Mobility: Supine to Sit     Supine to sit: Mod assist;+2 for safety/equipment     General bed mobility comments: assist with LEs and to elevate trunk  Transfers Overall transfer level: Needs assistance Equipment used: Rolling walker (2 wheeled)             General transfer comment: unable to stand safely with +2 assist, partial sit to stand and lateral scooting toward HOB with +2 assist, pt = 40%   Ambulation/Gait                Stairs            Wheelchair  Mobility    Modified Rankin (Stroke Patients Only)       Balance Overall balance assessment: Needs assistance Sitting-balance support: Feet supported;Single extremity supported Sitting balance-Leahy Scale: Poor Sitting balance - Comments: repated anterior unpredictable wt shift Postural control: Other (comment)   Standing balance-Leahy Scale: Zero Standing balance comment: unable with +2 assist                             Pertinent Vitals/Pain Pain Assessment: No/denies pain    Home Living Family/patient expects to be discharged to:: Skilled nursing facility Living Arrangements: Spouse/significant other                    Prior Function Level of Independence: Needs assistance   Gait / Transfers Assistance Needed: afternoons pt uses w/c; pt is ambulatory with RW  in the am and later at night per wife ---short distances with RW-mod I, assist at times depending on time of day             Hand Dominance        Extremity/Trunk Assessment   Upper Extremity Assessment Upper Extremity Assessment: Defer to OT evaluation    Lower Extremity Assessment Lower Extremity Assessment: RLE deficits/detail RLE Deficits / Details: extremities at least 3/5 to 3+/5; A/AAROM [facility]       Communication   Communication: Expressive difficulties(speech slurred, ?d/t lethargy)  Cognition Arousal/Alertness: Lethargic  Overall Cognitive Status: Impaired/Different from baseline Area of Impairment: Safety/judgement;Problem solving                         Safety/Judgement: Decreased awareness of safety;Decreased awareness of deficits   Problem Solving: Difficulty sequencing;Requires verbal cues;Requires tactile cues;Decreased initiation General Comments: pt with unpredictable movements, repeated sudden anterior wt shifts while sitting EOB      General Comments      Exercises     Assessment/Plan    PT Assessment Patient needs continued PT services   PT Problem List Decreased strength;Decreased mobility;Decreased safety awareness;Decreased balance;Decreased activity tolerance;Decreased knowledge of use of DME;Decreased coordination       PT Treatment Interventions DME instruction;Gait training;Therapeutic activities;Therapeutic exercise;Patient/family education;Functional mobility training;Balance training    PT Goals (Current goals can be found in the Care Plan section)  Acute Rehab PT Goals PT Goal Formulation: With patient/family Time For Goal Achievement: 11/20/17 Potential to Achieve Goals: Good    Frequency Min 2X/week   Barriers to discharge        Co-evaluation               AM-PAC PT "6 Clicks" Daily Activity  Outcome Measure Difficulty turning over in bed (including adjusting bedclothes, sheets and blankets)?: Unable Difficulty moving from lying on back to sitting on the side of the bed? : Unable Difficulty sitting down on and standing up from a chair with arms (e.g., wheelchair, bedside commode, etc,.)?: Unable Help needed moving to and from a bed to chair (including a wheelchair)?: Total Help needed walking in hospital room?: Total Help needed climbing 3-5 steps with a railing? : Total 6 Click Score: 6    End of Session Equipment Utilized During Treatment: Gait belt Activity Tolerance: Patient limited by lethargy Patient left: in bed;with call bell/phone within reach;with bed alarm set   PT Visit Diagnosis: Unsteadiness on feet (R26.81);History of falling (Z91.81)    Time: [id number] PT Time Calculation (min) (ACUTE ONLY): 24 min   Charges:   PT Evaluation $PT Eval Moderate Complexity: 1 Mod PT Treatments $Therapeutic Activity: 8-22 mins        Drucilla Chaletara Laqueta Bonaventura, PT Pager: [phone] 11/06/2017   [facility] [phone]   [facility],Amberia Bayless 11/06/2017, 1:21 PM

## 2017-11-06 NOTE — Clinical Social Work Note (Signed)
Clinical Social Work Assessment  Patient Details  Name: [name] MRN: [id number] Date of Birth: 1940/10/23  Date of referral:  11/06/17               Reason for consult:  Facility Placement                Permission sought to share information with:  Family Supports Permission granted to share information::     Name::     wife Chiropractor::     Relationship::     Contact Information:     Housing/Transportation Living arrangements for the past 2 months:  Single Family Home Source of Information:  Patient, Medical Team Patient Interpreter Needed:  None Criminal Activity/Legal Involvement Pertinent to Current Situation/Hospitalization:  No - Comment as needed Significant Relationships:  Spouse Lives with:  Spouse Do you feel safe going back to the place where you live?  Yes Need for family participation in patient care:  Yes (Comment)(wife involved)  Care giving concerns:  Pt admitted from home where he resides with his wife. History of Parkinson's. States he has been having falls at home. Came to ED 2 days ago for fall, was set up with home health. Pt reports having had HHPT and SNF rehab in the past.  Admitted under observation currently for UTI/sepsis.    Social Worker assessment / plan:  CSW consulted to assist with disposition. Met with pt at bedside- wife present but had to take phone call so did not participate. Gathered brief description of current care needs above.  Per medical team therapy evaluation has been consulted to determine mobility status and needs. Pt states he is open to SNF if recommended. Will follow, disposition TBD  Employment status:  Retired Forensic scientist:  Managed Medicare([employer]) PT Recommendations:  (pending) Information / Referral to community resources:     Patient/Family's Response to care:  Pt appreciative  Patient/Family's Understanding of and Emotional Response to Diagnosis, Current Treatment, and Prognosis:  Did  not discuss current treatment, however pt did describe events of last few days matching chart description. Was calm and stoic. States, "I wonder if rehab would be best for me this time."  Emotional Assessment Appearance:  Appears stated age(face bruised) Attitude/Demeanor/Rapport:  Engaged Affect (typically observed):  Calm, Appropriate Orientation:  Oriented to Self, Oriented to Place, Oriented to  Time, Oriented to Situation Alcohol / Substance use:  Not Applicable Psych involvement (Current and /or in the community):  No (Comment)  Discharge Needs  Concerns to be addressed:  Discharge Planning Concerns Readmission within the last 30 days:  Yes Current discharge risk:  (assessing) Barriers to Discharge:  (assessing)   Nila Nephew, LCSW 11/06/2017, 10:44 AM [phone]

## 2017-11-07 DIAGNOSIS — Z79899 Other long term (current) drug therapy: Secondary | ICD-10-CM | POA: Diagnosis not present

## 2017-11-07 DIAGNOSIS — S0121XA Laceration without foreign body of nose, initial encounter: Secondary | ICD-10-CM | POA: Diagnosis present

## 2017-11-07 DIAGNOSIS — R159 Full incontinence of feces: Secondary | ICD-10-CM | POA: Diagnosis present

## 2017-11-07 DIAGNOSIS — S01112A Laceration without foreign body of left eyelid and periocular area, initial encounter: Secondary | ICD-10-CM | POA: Diagnosis present

## 2017-11-07 DIAGNOSIS — B9620 Unspecified Escherichia coli [E. coli] as the cause of diseases classified elsewhere: Secondary | ICD-10-CM | POA: Diagnosis present

## 2017-11-07 DIAGNOSIS — R32 Unspecified urinary incontinence: Secondary | ICD-10-CM | POA: Diagnosis present

## 2017-11-07 DIAGNOSIS — N390 Urinary tract infection, site not specified: Secondary | ICD-10-CM | POA: Diagnosis present

## 2017-11-07 DIAGNOSIS — R296 Repeated falls: Secondary | ICD-10-CM | POA: Diagnosis present

## 2017-11-07 DIAGNOSIS — W1830XA Fall on same level, unspecified, initial encounter: Secondary | ICD-10-CM | POA: Diagnosis present

## 2017-11-07 DIAGNOSIS — R509 Fever, unspecified: Secondary | ICD-10-CM | POA: Diagnosis present

## 2017-11-07 DIAGNOSIS — E039 Hypothyroidism, unspecified: Secondary | ICD-10-CM | POA: Diagnosis present

## 2017-11-07 DIAGNOSIS — G20 Parkinson's disease: Secondary | ICD-10-CM | POA: Diagnosis present

## 2017-11-07 DIAGNOSIS — Y92002 Bathroom of unspecified non-institutional (private) residence single-family (private) house as the place of occurrence of the external cause: Secondary | ICD-10-CM | POA: Diagnosis not present

## 2017-11-07 DIAGNOSIS — F329 Major depressive disorder, single episode, unspecified: Secondary | ICD-10-CM | POA: Diagnosis present

## 2017-11-07 DIAGNOSIS — F411 Generalized anxiety disorder: Secondary | ICD-10-CM | POA: Diagnosis present

## 2017-11-07 DIAGNOSIS — A419 Sepsis, unspecified organism: Secondary | ICD-10-CM | POA: Diagnosis present

## 2017-11-07 DIAGNOSIS — R05 Cough: Secondary | ICD-10-CM | POA: Diagnosis present

## 2017-11-07 DIAGNOSIS — R358 Other polyuria: Secondary | ICD-10-CM | POA: Diagnosis present

## 2017-11-07 DIAGNOSIS — K219 Gastro-esophageal reflux disease without esophagitis: Secondary | ICD-10-CM | POA: Diagnosis present

## 2017-11-07 DIAGNOSIS — Z87891 Personal history of nicotine dependence: Secondary | ICD-10-CM | POA: Diagnosis not present

## 2017-11-07 LAB — URINE CULTURE

## 2017-11-07 LAB — CBC
HCT: 36.8 % — ABNORMAL LOW (ref 39.0–52.0)
Hemoglobin: 12.3 g/dL — ABNORMAL LOW (ref 13.0–17.0)
MCH: 29.7 pg (ref 26.0–34.0)
MCHC: 33.4 g/dL (ref 30.0–36.0)
MCV: 88.9 fL (ref 78.0–100.0)
Platelets: 115 10*3/uL — ABNORMAL LOW (ref 150–400)
RBC: 4.14 MIL/uL — ABNORMAL LOW (ref 4.22–5.81)
RDW: 13.8 % (ref 11.5–15.5)
WBC: 11.7 10*3/uL — ABNORMAL HIGH (ref 4.0–10.5)

## 2017-11-07 LAB — MAGNESIUM: Magnesium: 2.2 mg/dL (ref 1.7–2.4)

## 2017-11-07 MED ORDER — ENOXAPARIN SODIUM 40 MG/0.4ML ~~LOC~~ SOLN
40.0000 mg | SUBCUTANEOUS | Status: DC
  Administered 2017-11-07 – 2017-11-09 (×3): 40 mg via SUBCUTANEOUS
  Filled 2017-11-07 (×3): qty 0.4

## 2017-11-07 NOTE — Progress Notes (Signed)
PROGRESS NOTE  [name]:[id number] DOB: [birth year] DOA: 11/05/2017 PCP: Kristian Covey, MD  HPI/Recap of past 24 hours: [name] is a 77 y.o. male with medical history significant for Parkinson disease, urinary incontinence, chronic anxiety/depression, GERD who presented to ED [facility] due to generalized weakness and recurrent falls.  Patient is a poor historian and reports that weakness has been progressively getting worse in the past 2 weeks.  Endorses a fever at home.  Associated with polyuria.  States symptoms feel like previous UTI.  Patient fell yesterday and went to ED med Center but UA was negative.  Larey Seat again this morning.  States he felt like he was about to fall and loss his balance.  No family members present at the time of this visit.  Denies dizziness, nausea, abdominal pain, chest pain, dyspnea, or palpitations.  11/06/2017: Assessed by PT with recommendation for SNF and 24-hour supervision/assistance.   11/07/2017: Patient seen and examined at his bedside.  Still reports generalized weakness.  Cough is improving.  Denies chest pain, dyspnea or palpitation.  Assessment/Plan: Active Problems:   Sepsis (HCC)   Sepsis secondary to complicated UTI Urine culture revealed greater than 100,000 colonies of gram-negative rods Continue IV ceftriaxone empirically Will narrow down antibiotics once leukocytosis improves and culture specificity return Blood culture no growth to date x2 WBC trending down Obtain CBC in the morning  Improving leukocytosis Continue IV antibiotics Continue to monitor WBC Obtain CBC in the morning  Gross hematuria, resolving Per the patient's wife patient had a in and out Foley cath placed in 2 days ago which caused him to bleed from his urethra. Continue gentle IV fluid Continue to monitor urine output No CKD or AKI  Improving chronic cough Continue to treat symptomatically  No sign of active infective process On duo nebs every 6 hours  as needed Home Mucinex and hypersaline nebs for pulmonary toilet  Parkinson disease continue home medications Follows with neurology outpatient Fall precautions  Ambulatory dysfunction with recurrent falls/physical debility/generalized weakness Continue to work with PT PT recommend SNF and 24-hour supervision/assistance due to gait unsteadiness and balance issues  Social worker consulted for SNF placement  Generalized weakness/anxiety never filled his prescription for escitalopram Discontinue escitalopram Encourage increase of protein calorie intake Continue working with physical therapy  GERD Continue PPI  Risks: Patient is unsafe discharge to his home due to high risk for fall from urosepsis and possibly bleeding from falls in an elderly patient with Parkinson's disease.    Would recommend continue IV antibiotics and physical therapy until clinically improved.  Would recommend an additional 1-2 midnights to continue IV antibiotics, IV hydration, and gait training.  Code Status: Full code  Family Communication: Spoke to wife on the phone who works as an NP for dialysis center- [facility] can be reached at [phone].  Disposition Plan: Possibly SNF on Monday, 11/06/2017.   Consultants:  None  Procedures:  None  Antimicrobials:  Rocephin  DVT prophylaxis: Subcu Lovenox   Objective: Vitals:   11/06/17 1013 11/06/17 1300 11/06/17 2130 11/07/17 0500  BP:  130/75 129/68 126/63  Pulse:  68 70 66  Resp:  18 18 16   Temp:  98 F (36.7 C) 98.2 F (36.8 C) 98.1 F (36.7 C)  TempSrc:  Oral Oral Oral  SpO2: 97% 95% 98% 97%  Weight:    113.4 kg  Height:        Intake/Output Summary (Last 24 hours) at 11/07/2017 1232 Last data filed  at 11/07/2017 0900 Gross per 24 hour  Intake 2358.16 ml  Output -  Net 2358.16 ml   Filed Weights   11/05/17 1939 11/06/17 0326 11/07/17 0500  Weight: 113.5 kg 113.5 kg 113.4 kg    Exam:  . General: 77 y.o. year-old male  well-developed well-nourished.  Appears weak.  Alert and minimally interactive. . Cardiovascular: Regular rate and rhythm with no rubs or gallops.  No thyromegaly or JVD noted. Marland Kitchen Respiratory: Clear to auscultation with no wheezes or rales. Good inspiratory effort. . Abdomen: Soft nontender nondistended with normal bowel sounds x4 quadrants. . Musculoskeletal: Trace lower extremity edema. 2/4 pulses in all 4 extremities. . Skin: Bruising affected upper extremities bilaterally and laceration affecting left side of his face. Marland Kitchen Psychiatry: Mood is appropriate for condition and setting   Data Reviewed: CBC: Recent Labs  Lab 11/04/17 0722 11/05/17 1434 11/06/17 0431 11/07/17 0527  WBC 5.6 14.6* 21.7* 11.7*  NEUTROABS 3.8 14.2*  --   --   HGB 14.4 14.3 12.1* 12.3*  HCT 42.2 42.5 36.5* 36.8*  MCV 87.9 88.9 89.5 88.9  PLT 144* 166 123* 115*   Basic Metabolic Panel: Recent Labs  Lab 11/04/17 0755 11/05/17 1547 11/06/17 0431 11/07/17 0527  NA 138 139 138  --   K 4.1 3.9 3.7  --   CL 103 107 109  --   CO2 35* 24 23  --   GLUCOSE 97 92 92  --   BUN 25* 25* 19  --   CREATININE 0.80 0.73 0.77  --   CALCIUM 8.4* 8.5* 7.8*  --   MG  --   --  1.5* 2.2   GFR: Estimated Creatinine Clearance: 105.2 mL/min (by C-G formula based on SCr of 0.77 mg/dL). Liver Function Tests: Recent Labs  Lab 11/04/17 0755 11/05/17 1547  AST 20 21  ALT <5 5  ALKPHOS 74 69  BILITOT 1.1 1.1  PROT 7.0 6.6  ALBUMIN 3.8 3.7   No results for input(s): LIPASE, AMYLASE in the last 168 hours. No results for input(s): AMMONIA in the last 168 hours. Coagulation Profile: Recent Labs  Lab 11/05/17 1547  INR 1.04   Cardiac Enzymes: Recent Labs  Lab 11/04/17 0713 11/05/17 1434  TROPONINI <0.03 <0.03   BNP (last 3 results) No results for input(s): PROBNP in the last 8760 hours. HbA1C: No results for input(s): HGBA1C in the last 72 hours. CBG: No results for input(s): GLUCAP in the last 168  hours. Lipid Profile: No results for input(s): CHOL, HDL, LDLCALC, TRIG, CHOLHDL, LDLDIRECT in the last 72 hours. Thyroid Function Tests: No results for input(s): TSH, T4TOTAL, FREET4, T3FREE, THYROIDAB in the last 72 hours. Anemia Panel: No results for input(s): VITAMINB12, FOLATE, FERRITIN, TIBC, IRON, RETICCTPCT in the last 72 hours. Urine analysis:    Component Value Date/Time   COLORURINE AMBER (A) 11/05/2017 1434   APPEARANCEUR CLEAR 11/05/2017 1434   LABSPEC 1.024 11/05/2017 1434   PHURINE 5.0 11/05/2017 1434   GLUCOSEU NEGATIVE 11/05/2017 1434   HGBUR SMALL (A) 11/05/2017 1434   BILIRUBINUR NEGATIVE 11/05/2017 1434   BILIRUBINUR 1+ 05/08/2015 1039   KETONESUR 20 (A) 11/05/2017 1434   PROTEINUR NEGATIVE 11/05/2017 1434   UROBILINOGEN 0.2 05/08/2015 1039   UROBILINOGEN 0.2 08/10/2014 1720   NITRITE NEGATIVE 11/05/2017 1434   LEUKOCYTESUR TRACE (A) 11/05/2017 1434   Sepsis Labs: @LABRCNTIP (procalcitonin:4,lacticidven:4)  ) Recent Results (from the past 240 hour(s))  Culture, blood (Routine x 2)     Status: None (Preliminary  result)   Collection Time: 11/05/17  2:34 PM  Result Value Ref Range Status   Specimen Description   Final    BLOOD RIGHT HAND Performed at [facility], [address] [place], [place] [address]    Special Requests   Final    BOTTLES DRAWN AEROBIC AND ANAEROBIC Blood Culture adequate volume Performed at [facility], [address] [place], [place] [address]    Culture   Final    NO GROWTH 2 DAYS Performed at [facility], [address] [place], [place] [address]    Report Status PENDING  Incomplete  Culture, blood (Routine x 2)     Status: None (Preliminary result)   Collection Time: 11/05/17  2:34 PM  Result Value Ref Range Status   Specimen Description   Final    BLOOD BLOOD LEFT FOREARM Performed at [facility], [address] [place], [place] [address]    Special  Requests   Final    BOTTLES DRAWN AEROBIC AND ANAEROBIC Blood Culture adequate volume Performed at [facility], [address] [place], [place] [address]    Culture   Final    NO GROWTH 2 DAYS Performed at [facility], [address] [place], [place] [address]    Report Status PENDING  Incomplete  Urine culture     Status: Abnormal   Collection Time: 11/05/17  2:34 PM  Result Value Ref Range Status   Specimen Description   Final    URINE, CATHETERIZED Performed at [facility], [address] [place], [place] [address]    Special Requests   Final    NONE Performed at [facility], [address] [place], [place] [address]    Culture >=100,000 COLONIES/mL ESCHERICHIA COLI (A)  Final   Report Status 11/07/2017 FINAL  Final   Organism ID, Bacteria ESCHERICHIA COLI (A)  Final      Susceptibility   Escherichia coli - MIC*    AMPICILLIN <=2 SENSITIVE Sensitive     CEFAZOLIN <=4 SENSITIVE Sensitive     CEFTRIAXONE <=1 SENSITIVE Sensitive     CIPROFLOXACIN <=0.25 SENSITIVE Sensitive     GENTAMICIN <=1 SENSITIVE Sensitive     IMIPENEM <=0.25 SENSITIVE Sensitive     NITROFURANTOIN <=16 SENSITIVE Sensitive     TRIMETH/SULFA <=20 SENSITIVE Sensitive     AMPICILLIN/SULBACTAM <=2 SENSITIVE Sensitive     PIP/TAZO <=4 SENSITIVE Sensitive     Extended ESBL NEGATIVE Sensitive     * >=100,000 COLONIES/mL ESCHERICHIA COLI      Studies: [place] Renal  Result Date: 11/06/2017 CLINICAL DATA:  Hematuria. EXAM: RENAL / URINARY TRACT ULTRASOUND COMPLETE COMPARISON:  None. FINDINGS: Right Kidney: Length: 11.72 cm. Echogenicity within normal limits. No mass or hydronephrosis visualized. Left Kidney: Length: 11.98 cm. Echogenicity within normal limits. No mass or hydronephrosis visualized. Bladder: Appears normal for degree of bladder distention. IMPRESSION: Negative exam. Electronically Signed   By: Elsie StainJohn T Curnes M.D.   On: 11/06/2017  18:55    Scheduled Meds: . carbidopa-levodopa  1 tablet Oral 8 times per day   And  . entacapone  200 mg Oral 8 times per day  . guaiFENesin  1,200 mg Oral BID  . pantoprazole  40 mg Oral Daily  . sodium chloride HYPERTONIC  4 mL Nebulization TID    Continuous Infusions: . sodium chloride 75 mL/hr at 11/07/17 0500  . cefTRIAXone (ROCEPHIN)  IV Stopped (11/06/17  2226)     LOS: 0 days     Darlin Drop, MD [facility] Pager [phone]  If 7PM-7AM, please contact night-coverage [email or web address] Password [facility] 11/07/2017, 12:32 PM

## 2017-11-07 NOTE — Progress Notes (Signed)
Contacted Hospitalist on call regarding red, raw scrotum. Patient complaining of "severe burning" in area. Patient incontinent of urine.   Reeves Forthoncetta Katz, RN 11/07/17 2205

## 2017-11-08 ENCOUNTER — Inpatient Hospital Stay (HOSPITAL_COMMUNITY): Payer: PPO

## 2017-11-08 LAB — CBC
HCT: 36 % — ABNORMAL LOW (ref 39.0–52.0)
Hemoglobin: 12.3 g/dL — ABNORMAL LOW (ref 13.0–17.0)
MCH: 29.9 pg (ref 26.0–34.0)
MCHC: 34.2 g/dL (ref 30.0–36.0)
MCV: 87.6 fL (ref 78.0–100.0)
PLATELETS: 105 10*3/uL — AB (ref 150–400)
RBC: 4.11 MIL/uL — ABNORMAL LOW (ref 4.22–5.81)
RDW: 13.4 % (ref 11.5–15.5)
WBC: 3.9 10*3/uL — ABNORMAL LOW (ref 4.0–10.5)

## 2017-11-08 LAB — LACTIC ACID, PLASMA: Lactic Acid, Venous: 0.8 mmol/L (ref 0.5–1.9)

## 2017-11-08 MED ORDER — CEPHALEXIN 500 MG PO CAPS
500.0000 mg | ORAL_CAPSULE | Freq: Three times a day (TID) | ORAL | Status: DC
  Administered 2017-11-08 – 2017-11-09 (×5): 500 mg via ORAL
  Filled 2017-11-08 (×5): qty 1

## 2017-11-08 MED ORDER — ZINC OXIDE 40 % EX OINT: TOPICAL_OINTMENT | CUTANEOUS | 0 refills | Status: AC | PRN

## 2017-11-08 MED ORDER — ZINC OXIDE 40 % EX OINT
TOPICAL_OINTMENT | Freq: Three times a day (TID) | CUTANEOUS | Status: DC
  Administered 2017-11-08 – 2017-11-09 (×4): via TOPICAL
  Filled 2017-11-08: qty 57

## 2017-11-08 MED ORDER — CEPHALEXIN 500 MG PO CAPS: 500.0000 mg | ORAL_CAPSULE | Freq: Three times a day (TID) | ORAL | 0 refills | Status: AC

## 2017-11-08 NOTE — [place] FL2 (Signed)
[place] MEDICAID FL2 LEVEL OF CARE SCREENING TOOL     IDENTIFICATION  Patient Name: [name] Birthdate: 03/17/1940 Sex: male Admission Date (Current Location): 11/05/2017  [facility] [place]   Facility and Address:  [facility],  [address], [place] [address]      Provider Number: [id number]  Attending Physician Name and Address:  Darlin DropHall, Carole N, DO  Relative Name and Phone Number:  Alonna BucklerRita Amy: [phone]    Current Level of Care: Hospital Recommended Level of Care: Skilled Nursing Facility Prior Approval Number:    Date Approved/Denied:   PASRR Number: [id number]  Discharge Plan: SNF    Current Diagnoses: Patient Active Problem List   Diagnosis Date Noted  . Sepsis (HCC) 11/05/2017  . Recurrent falls 08/10/2014  . Falls 08/10/2014  . Weakness   . Parkinson disease (HCC)   . Morbid obesity (HCC) 07/14/2012  . WEAKNESS 01/30/2010  . URINARY INCONTINENCE 01/30/2010  . DYSURIA 11/13/2009  . PARKINSON'S DISEASE 01/29/2009    Orientation RESPIRATION BLADDER Height & Weight     Self, Time, Situation, Place  Normal Indwelling catheter Weight: 251 lb 12.3 oz (114.2 kg) Height:  6\' 2"  (188 cm)  BEHAVIORAL SYMPTOMS/MOOD NEUROLOGICAL BOWEL NUTRITION STATUS      Continent Diet(Regular)  AMBULATORY STATUS COMMUNICATION OF NEEDS Skin   Extensive Assist Verbally Normal                       Personal Care Assistance Level of Assistance  Feeding, Dressing, Bathing Bathing Assistance: Maximum assistance Feeding assistance: Independent Dressing Assistance: Limited assistance     Functional Limitations Info  Sight, Hearing, Speech Sight Info: Adequate Hearing Info: Adequate Speech Info: Adequate    SPECIAL CARE FACTORS FREQUENCY  PT (By licensed PT), OT (By licensed OT)     PT Frequency: 5x/week OT Frequency: 5x/week            Contractures Contractures Info: Not present    Additional Factors Info   Code Status, Allergies Code Status Info: Full Allergies Info: NKA           Current Medications (11/08/2017):  This is the current hospital active medication list Current Facility-Administered Medications  Medication Dose Route Frequency Provider Last Rate Last Dose  . 0.9 %  sodium chloride infusion   Intravenous Continuous Darlin DropHall, Carole N, DO 75 mL/hr at 11/08/17 0346    . acetaminophen (TYLENOL) tablet 650 mg  650 mg Oral Q6H PRN Dow AdolphHall, Carole N, DO   650 mg at 11/08/17 1040  . carbidopa-levodopa (SINEMET IR) 25-100 MG per tablet immediate release 1 tablet  1 tablet Oral 8 times per day Darlin DropHall, Carole N, DO   1 tablet at 11/08/17 1254   And  . entacapone (COMTAN) tablet 200 mg  200 mg Oral 8 times per day Dow AdolphHall, Carole N, DO   200 mg at 11/08/17 1254  . cephALEXin (KEFLEX) capsule 500 mg  500 mg Oral Q8H Hall, Carole N, DO   500 mg at 11/08/17 1254  . enoxaparin (LOVENOX) injection 40 mg  40 mg Subcutaneous Q24H Hall, Carole N, DO   40 mg at 11/08/17 1254  . guaiFENesin (MUCINEX) 12 hr tablet 1,200 mg  1,200 mg Oral BID Dow AdolphHall, Carole N, DO   1,200 mg at 11/08/17 1039  . ipratropium-albuterol (DUONEB) 0.5-2.5 (3) MG/3ML nebulizer solution 3 mL  3 mL Nebulization Q6H PRN Dow AdolphHall, Carole N, DO   3 mL at 11/06/17  1013  . liver oil-zinc oxide (DESITIN) 40 % ointment   Topical Q8H Hall, Carole N, DO      . ondansetron (ZOFRAN) injection 4 mg  4 mg Intravenous Q6H PRN Dow Adolph N, DO      . pantoprazole (PROTONIX) EC tablet 40 mg  40 mg Oral Daily [place], Carole N, DO   40 mg at 11/08/17 1040  . sodium chloride HYPERTONIC 3 % nebulizer solution 4 mL  4 mL Nebulization TID Dow Adolph N, DO   4 mL at 11/06/17 1013     Discharge Medications: Please see discharge summary for a list of discharge medications.  Relevant Imaging Results:  Relevant Lab Results:   Additional Information SSN: [id number]  Enid Cutter, [place]

## 2017-11-08 NOTE — Discharge Summary (Addendum)
Discharge Summary  [name] ZOX:[id number] DOB: 09/30/1940  PCP: Eric CoveyBurchette, Bruce W, MD  Admit date: 11/05/2017 Discharge date: 11/08/2017  Time spent: 25 minutes  Recommendations for Outpatient Follow-up:  1. Continue physical therapy 2. Follow-up with neurology for your Parkinson disease 3. Follow-up with PCP 4. Take your medications as prescribed  Discharge Diagnoses:  Active Hospital Problems   Diagnosis Date Noted  . Sepsis (HCC) 11/05/2017    Resolved Hospital Problems  No resolved problems to display.    Discharge Condition: Stable  Diet recommendation: Resume previous diet  Vitals:   11/07/17 2235 11/08/17 0601  BP: 124/65 121/72  Pulse: 76 68  Resp: 17 16  Temp: 98.6 F (37 C) 98.3 F (36.8 C)  SpO2: 98% 99%    History of present illness:  Eric ItoJames B Brownis a 76 y.o.malewith medical history significant forParkinson disease, urinary incontinence, chronic anxiety/depression, GERD who [facility] to generalized weakness and recurrent falls. Patient is a poor historian and reports that weakness has been progressively getting worse in the past 2 weeks. Endorses a fever at home. Associated with polyuria. States symptoms feel like previous UTI. Patient fell yesterday and went to ED med Center but UA was negative. Larey SeatFell again this morning. States he felt like he was about tofall andloss his balance. No family members present at the time of this visit. Denies dizziness, nausea, abdominal pain, chest pain, dyspnea, or palpitations.  -11/06/2017: Assessed by PT with recommendation for SNF and 24-hour supervision/assistance.  -11/07/2017: Generalized weakness.  Cough is improving.  Denies chest pain, dyspnea or palpitation.  11/08/2017: Patient seen and examined at his bedside.  No acute events overnight.  Leukocytosis resolved.  Lactic acid unremarkable.  Patient will need to continue physical therapy/occupational therapy in order to improve his  functional status.  Patient is hemodynamically stable.  We will transition to oral antibiotic x5 days.  Awaiting insurance authorization for discharge to short-term rehab, possibly Monday, 11/06/2017.  Hospital Course:  Active Problems:   Sepsis (HCC)  Sepsis secondary to complicated UTI Sepsis physiology is resolving.  WBC 3.9 and lactic acid 0.8.  Vital signs stable.  No signs of Sirs criteria Urine culture revealed greater than 100,000 colonies E. coli, pansensitive. Completed 3 days of IV ceftriaxone empirically Started on Keflex 500 mg 3 times daily x5 days Continue to monitor fever curve Continue to monitor WBC Obtain CBC in the morning  Resolved leukocytosis Completed IV antibiotics Continue to monitor  Resolved Gross hematuria suspect secondary to trauma Per the patient's wife patient had a in and out Foley cath placed in 2 days ago which caused him to bleed from his urethra. Continue gentle IV fluid hydration  Improving chronic cough Continue to treat symptomatically  No sign of active infective process On duo nebs every 6 hours as needed Home Mucinex and hypersaline nebs for pulmonary toilet  Parkinson disease Continue home medications Follows with neurology outpatient Fall precautions  Ambulatory dysfunction with recurrent falls/physical debility/generalized weakness Continue to work with PT PT recommend SNF and 24-hour supervision/assistance due to gait unsteadiness and balance issues  Social worker consulted for SNF placement  Generalized weakness/anxiety never filled his prescription for escitalopram Discontinue escitalopram Encourage increase of protein calorie intake Continue working with physical therapy  GERD Continue PPI  Risks: Patient is unsafe discharge to his home due to high risk for fall and possibly bleeding from falls in an elderly patient with Parkinson's disease.     Code Status: Full code  Family Communication: Spoke to wife  on the phone on 11/06/2017 and 11/07/2017 who works as an NP for dialysis center- [name] can be reached at [phone].  Disposition Plan: Possibly SNF on Monday, 11/09/2017 pending insurance authorization.   Consultants:  None  Procedures:  None  Antimicrobials:  Completed 3 days of Rocephin  Keflex  DVT prophylaxis: Subcu Lovenox   Discharge Exam: BP 121/72 (BP Location: Right Arm)   Pulse 68   Temp 98.3 F (36.8 C) (Oral)   Resp 16   Ht 6\' 2"  (1.88 m)   Wt 114.2 kg   SpO2 99%   BMI 32.32 kg/m  . General: 77 y.o. year-old male well developed well nourished in no acute distress.  Alert and interactive. . Cardiovascular: Regular rate and rhythm with no rubs or gallops.  No thyromegaly or JVD noted.   Marland Kitchen Respiratory: Clear to auscultation with no wheezes or rales. Good inspiratory effort. . Abdomen: Soft nontender nondistended with normal bowel sounds x4 quadrants. . Psychiatry: Mood is appropriate for condition and setting  Discharge Instructions You were cared for by a hospitalist during your hospital stay. If you have any questions about your discharge medications or the care you received while you were in the hospital after you are discharged, you can call the unit and asked to speak with the hospitalist on call if the hospitalist that took care of you is not available. Once you are discharged, your primary care physician will handle any further medical issues. Please note that NO REFILLS for any discharge medications will be authorized once you are discharged, as it is imperative that you return to your primary care physician (or establish a relationship with a primary care physician if you do not have one) for your aftercare needs so that they can reassess your need for medications and monitor your lab values.   Allergies as of 11/08/2017   No Known Allergies     Medication List    STOP taking these medications   doxycycline 100 MG capsule Commonly  known as:  VIBRAMYCIN   escitalopram 10 MG tablet Commonly known as:  LEXAPRO     TAKE these medications   carbidopa-levodopa-entacapone 25-100-200 MG tablet Commonly known as:  STALEVO One pill 7 times a day, at 6, 9, 12, 3 PM, 6 PM, 9 PM and 11 PM. What changed:    how much to take  how to take this  when to take this   cephALEXin 500 MG capsule Commonly known as:  KEFLEX Take 1 capsule (500 mg total) by mouth 3 (three) times daily for 5 days.   Guaifenesin 1200 MG Tb12 Take 1 tablet (1,200 mg total) by mouth 2 (two) times daily. What changed:    when to take this  reasons to take this   liver oil-zinc oxide 40 % ointment Commonly known as:  DESITIN Apply topically as needed for irritation.   omeprazole 20 MG capsule Commonly known as:  PRILOSEC Take 20 mg by mouth daily.      No Known Allergies Follow-up Information    Eric Covey, MD. Call in 1 day(s).   Specialty:  Family Medicine Why:  Please call for a post hospital follow-up appointment. Contact information: [address] [place] [address] [phone]            The results of significant diagnostics from this hospitalization (including imaging, microbiology, ancillary and laboratory) are listed below for reference.    Significant Diagnostic Studies: Dg  Chest 2 View  Result Date: 11/05/2017 CLINICAL DATA:  Possible sepsis EXAM: CHEST - 2 VIEW COMPARISON:  11/04/2017, 06/28/2017 FINDINGS: Borderline cardiomegaly. Both lungs are clear. Old left fifth rib fracture. Aortic atherosclerosis. Degenerative changes of the spine. IMPRESSION: No active cardiopulmonary disease. Electronically Signed   By: Jasmine Pang M.D.   On: 11/05/2017 14:52   Dg Chest 2 View  Result Date: 11/04/2017 CLINICAL DATA:  Pain following fall EXAM: CHEST - 2 VIEW COMPARISON:  Jun 28, 2017 FINDINGS: Lungs are clear. Heart size and pulmonary vascularity are normal. No adenopathy. There is aortic  atherosclerosis. There is degenerative change in the thoracic spine. No pneumothorax. IMPRESSION: No edema or consolidation. Stable cardiac silhouette. There is aortic atherosclerosis. No evident pneumothorax. Aortic Atherosclerosis (ICD10-I70.0). Electronically Signed   By: Bretta Bang III M.D.   On: 11/04/2017 07:56   Ct Head Wo Contrast  Result Date: 11/05/2017 CLINICAL DATA:  Increased falls in the past week. Fever. History of Parkinson's. EXAM: CT HEAD WITHOUT CONTRAST TECHNIQUE: Contiguous axial images were obtained from the base of the skull through the vertex without intravenous contrast. COMPARISON:  11/04/2017 FINDINGS: Brain: There is no evidence of acute infarct, intracranial hemorrhage, mass, midline shift, or extra-axial fluid collection. Cerebral atrophy is mild-to-moderate for age. Cerebral white matter hypodensities are unchanged and nonspecific but compatible with mild chronic small vessel ischemic disease. Vascular: Calcified atherosclerosis at the skull base. No hyperdense vessel. Skull: No fracture or focal osseous lesion. Sinuses/Orbits: Visualized paranasal sinuses and mastoid air cells are clear. Bilateral cataract extraction. Other: None. IMPRESSION: 1. No evidence of acute intracranial abnormality. 2. Mild-to-moderate chronic small vessel ischemic disease. Electronically Signed   By: Sebastian Ache M.D.   On: 11/05/2017 16:30   Ct Head Wo Contrast  Result Date: 11/04/2017 CLINICAL DATA:  Pain following fall EXAM: CT HEAD WITHOUT CONTRAST CT MAXILLOFACIAL WITHOUT CONTRAST TECHNIQUE: Multidetector CT imaging of the head and maxillofacial structures were performed using the standard protocol without intravenous contrast. Multiplanar CT image reconstructions of the maxillofacial structures were also generated. COMPARISON:  Head CT Jun 28, 2017 FINDINGS: CT HEAD FINDINGS Brain: There is age related volume loss, stable. There is no evident mass, hemorrhage, extra-axial fluid  collection, or midline shift. Patchy small vessel disease in the centra semiovale bilaterally is stable. No new gray-white compartment lesions are evident. No evident acute infarct. Vascular: There is no hyperdense vessel. There is calcification in each carotid siphon region. There is calcification in the distal right vertebral artery. Skull: Bony calvarium appears intact. There is a left parietal scalp hematoma. Other: Mastoid air cells are clear. There is debris in each external auditory canal, more on the left than on the right. CT MAXILLOFACIAL FINDINGS Osseous: There is no evident fracture or dislocation. No blastic or lytic bone lesions. Orbits: There is preseptal soft tissue swelling over the left orbit. There is no intraorbital lesion. Intraorbital contents appear symmetric bilaterally. Globes bilaterally appear intact. Sinuses: There is slight mucosal thickening in the inferolateral left maxillary antrum. There is mucosal thickening in several ethmoid air cells. Other paranasal sinuses are clear. No air-fluid level. No bony destruction or expansion. Ostiomeatal unit complexes are patent bilaterally. There is leftward deviation of the nasal septum. Nares are not obstructed on either side. Soft tissues: There is soft tissue swelling over the medial upper face and preseptal orbital region. No other soft tissue swelling or hematoma. No abscess. Visualized salivary glands appear symmetric bilaterally. There is no adenopathy. Tongue and tongue base regions  appear normal. Visualize pharynx appears normal. There is pannus posterior to the odontoid. Visualized upper cervical spine appears intact. There is left and right carotid artery calcification. IMPRESSION: Head CT: Age related volume loss with patchy supratentorial small vessel disease. No acute infarct. No mass or hemorrhage. There are foci of arterial vascular calcification. There is a left parietal scalp hematoma. No fracture. There is probable cerumen in  the external auditory canals, more severe on the left than on the right. CT maxillofacial: 1.  No evident fracture or dislocation. 2. No intraorbital lesions. There is preseptal soft tissue swelling over the left orbit. 3. Left upper facial swelling. No well-defined hematoma. No abscess. 4. Mucosal thickening in several ethmoid air cells. Slight mucosal thickening in the inferolateral left maxillary antrum. Other paranasal sinuses are clear. Ostiomeatal unit complexes are patent bilaterally. There is leftward deviation of the nasal septum. Electronically Signed   By: Bretta Bang III M.D.   On: 11/04/2017 08:04   US Renal  Result Date: 11/06/2017 CLINICAL DATA:  Hematuria. EXAM: RENAL / URINARY TRACT ULTRASOUND COMPLETE COMPARISON:  None. FINDINGS: Right Kidney: Length: 11.72 cm. Echogenicity within normal limits. No mass or hydronephrosis visualized. Left Kidney: Length: 11.98 cm. Echogenicity within normal limits. No mass or hydronephrosis visualized. Bladder: Appears normal for degree of bladder distention. IMPRESSION: Negative exam. Electronically Signed   By: Elsie Stain M.D.   On: 11/06/2017 18:55   Ct Maxillofacial Wo Contrast  Result Date: 11/04/2017 CLINICAL DATA:  Pain following fall EXAM: CT HEAD WITHOUT CONTRAST CT MAXILLOFACIAL WITHOUT CONTRAST TECHNIQUE: Multidetector CT imaging of the head and maxillofacial structures were performed using the standard protocol without intravenous contrast. Multiplanar CT image reconstructions of the maxillofacial structures were also generated. COMPARISON:  Head CT Jun 28, 2017 FINDINGS: CT HEAD FINDINGS Brain: There is age related volume loss, stable. There is no evident mass, hemorrhage, extra-axial fluid collection, or midline shift. Patchy small vessel disease in the centra semiovale bilaterally is stable. No new gray-white compartment lesions are evident. No evident acute infarct. Vascular: There is no hyperdense vessel. There is calcification in  each carotid siphon region. There is calcification in the distal right vertebral artery. Skull: Bony calvarium appears intact. There is a left parietal scalp hematoma. Other: Mastoid air cells are clear. There is debris in each external auditory canal, more on the left than on the right. CT MAXILLOFACIAL FINDINGS Osseous: There is no evident fracture or dislocation. No blastic or lytic bone lesions. Orbits: There is preseptal soft tissue swelling over the left orbit. There is no intraorbital lesion. Intraorbital contents appear symmetric bilaterally. Globes bilaterally appear intact. Sinuses: There is slight mucosal thickening in the inferolateral left maxillary antrum. There is mucosal thickening in several ethmoid air cells. Other paranasal sinuses are clear. No air-fluid level. No bony destruction or expansion. Ostiomeatal unit complexes are patent bilaterally. There is leftward deviation of the nasal septum. Nares are not obstructed on either side. Soft tissues: There is soft tissue swelling over the medial upper face and preseptal orbital region. No other soft tissue swelling or hematoma. No abscess. Visualized salivary glands appear symmetric bilaterally. There is no adenopathy. Tongue and tongue base regions appear normal. Visualize pharynx appears normal. There is pannus posterior to the odontoid. Visualized upper cervical spine appears intact. There is left and right carotid artery calcification. IMPRESSION: Head CT: Age related volume loss with patchy supratentorial small vessel disease. No acute infarct. No mass or hemorrhage. There are foci of arterial vascular calcification. There  is a left parietal scalp hematoma. No fracture. There is probable cerumen in the external auditory canals, more severe on the left than on the right. CT maxillofacial: 1.  No evident fracture or dislocation. 2. No intraorbital lesions. There is preseptal soft tissue swelling over the left orbit. 3. Left upper facial swelling.  No well-defined hematoma. No abscess. 4. Mucosal thickening in several ethmoid air cells. Slight mucosal thickening in the inferolateral left maxillary antrum. Other paranasal sinuses are clear. Ostiomeatal unit complexes are patent bilaterally. There is leftward deviation of the nasal septum. Electronically Signed   By: Bretta Bang III M.D.   On: 11/04/2017 08:04    Microbiology: Recent Results (from the past 240 hour(s))  Culture, blood (Routine x 2)     Status: None (Preliminary result)   Collection Time: 11/05/17  2:34 PM  Result Value Ref Range Status   Specimen Description   Final    BLOOD RIGHT HAND Performed at [facility], [address] [place], [place] [address]    Special Requests   Final    BOTTLES DRAWN AEROBIC AND ANAEROBIC Blood Culture adequate volume Performed at [facility], [address], [place], [place] [address]    Culture   Final    NO GROWTH 3 DAYS Performed at [facility], [address], [place], [place] [address]    Report Status PENDING  Incomplete  Culture, blood (Routine x 2)     Status: None (Preliminary result)   Collection Time: 11/05/17  2:34 PM  Result Value Ref Range Status   Specimen Description   Final    BLOOD BLOOD LEFT FOREARM Performed at [facility], [address], [place], [place] [address]    Special Requests   Final    BOTTLES DRAWN AEROBIC AND ANAEROBIC Blood Culture adequate volume Performed at [facility], [address] [place], [place] [address]    Culture   Final    NO GROWTH 3 DAYS Performed at [facility], [address], [place], [place] [address]    Report Status PENDING  Incomplete  Urine culture     Status: Abnormal   Collection Time: 11/05/17  2:34 PM  Result Value Ref Range Status   Specimen Description   Final    URINE, CATHETERIZED Performed at [facility], [address], [place],  [place] [address]    Special Requests   Final    NONE Performed at [facility], [address], [place], [place] [address]    Culture >=100,000 COLONIES/mL ESCHERICHIA COLI (A)  Final   Report Status 11/07/2017 FINAL  Final   Organism ID, Bacteria ESCHERICHIA COLI (A)  Final      Susceptibility   Escherichia coli - MIC*    AMPICILLIN <=2 SENSITIVE Sensitive     CEFAZOLIN <=4 SENSITIVE Sensitive     CEFTRIAXONE <=1 SENSITIVE Sensitive     CIPROFLOXACIN <=0.25 SENSITIVE Sensitive     GENTAMICIN <=1 SENSITIVE Sensitive     IMIPENEM <=0.25 SENSITIVE Sensitive     NITROFURANTOIN <=16 SENSITIVE Sensitive     TRIMETH/SULFA <=20 SENSITIVE Sensitive     AMPICILLIN/SULBACTAM <=2 SENSITIVE Sensitive     PIP/TAZO <=4 SENSITIVE Sensitive     Extended ESBL NEGATIVE Sensitive     * >=100,000 COLONIES/mL ESCHERICHIA COLI     Labs: Basic Metabolic Panel: Recent Labs  Lab 11/04/17 0755 11/05/17 1547 11/06/17 0431 11/07/17 0527  NA  138 139 138  --   K 4.1 3.9 3.7  --   CL 103 107 109  --   CO2 35* 24 23  --   GLUCOSE 97 92 92  --   BUN 25* 25* 19  --   CREATININE 0.80 0.73 0.77  --   CALCIUM 8.4* 8.5* 7.8*  --   MG  --   --  1.5* 2.2   Liver Function Tests: Recent Labs  Lab 11/04/17 0755 11/05/17 1547  AST 20 21  ALT <5 5  ALKPHOS 74 69  BILITOT 1.1 1.1  PROT 7.0 6.6  ALBUMIN 3.8 3.7   No results for input(s): LIPASE, AMYLASE in the last 168 hours. No results for input(s): AMMONIA in the last 168 hours. CBC: Recent Labs  Lab 11/04/17 0722 11/05/17 1434 11/06/17 0431 11/07/17 0527 11/08/17 0515  WBC 5.6 14.6* 21.7* 11.7* 3.9*  NEUTROABS 3.8 14.2*  --   --   --   HGB 14.4 14.3 12.1* 12.3* 12.3*  HCT 42.2 42.5 36.5* 36.8* 36.0*  MCV 87.9 88.9 89.5 88.9 87.6  PLT 144* 166 123* 115* 105*   Cardiac Enzymes: Recent Labs  Lab 11/04/17 0713 11/05/17 1434  TROPONINI <0.03 <0.03   BNP: BNP (last 3 results) No results for input(s): BNP in the last 8760  hours.  ProBNP (last 3 results) No results for input(s): PROBNP in the last 8760 hours.  CBG: No results for input(s): GLUCAP in the last 168 hours.     Signed:  Darlin Drop, MD [facility] 11/08/2017, 12:03 PM

## 2017-11-09 DIAGNOSIS — Z87891 Personal history of nicotine dependence: Secondary | ICD-10-CM | POA: Diagnosis not present

## 2017-11-09 DIAGNOSIS — Z743 Need for continuous supervision: Secondary | ICD-10-CM | POA: Diagnosis not present

## 2017-11-09 DIAGNOSIS — M549 Dorsalgia, unspecified: Secondary | ICD-10-CM | POA: Diagnosis not present

## 2017-11-09 DIAGNOSIS — R1312 Dysphagia, oropharyngeal phase: Secondary | ICD-10-CM | POA: Diagnosis not present

## 2017-11-09 DIAGNOSIS — R4781 Slurred speech: Secondary | ICD-10-CM | POA: Diagnosis not present

## 2017-11-09 DIAGNOSIS — R531 Weakness: Secondary | ICD-10-CM | POA: Diagnosis not present

## 2017-11-09 DIAGNOSIS — G20 Parkinson's disease: Secondary | ICD-10-CM | POA: Diagnosis not present

## 2017-11-09 DIAGNOSIS — R05 Cough: Secondary | ICD-10-CM | POA: Diagnosis not present

## 2017-11-09 DIAGNOSIS — R279 Unspecified lack of coordination: Secondary | ICD-10-CM | POA: Diagnosis not present

## 2017-11-09 DIAGNOSIS — R498 Other voice and resonance disorders: Secondary | ICD-10-CM | POA: Diagnosis not present

## 2017-11-09 DIAGNOSIS — M2550 Pain in unspecified joint: Secondary | ICD-10-CM | POA: Diagnosis not present

## 2017-11-09 DIAGNOSIS — M6281 Muscle weakness (generalized): Secondary | ICD-10-CM | POA: Diagnosis not present

## 2017-11-09 DIAGNOSIS — A419 Sepsis, unspecified organism: Secondary | ICD-10-CM | POA: Diagnosis not present

## 2017-11-09 DIAGNOSIS — R509 Fever, unspecified: Secondary | ICD-10-CM | POA: Diagnosis not present

## 2017-11-09 DIAGNOSIS — R404 Transient alteration of awareness: Secondary | ICD-10-CM | POA: Diagnosis not present

## 2017-11-09 DIAGNOSIS — Z7401 Bed confinement status: Secondary | ICD-10-CM | POA: Diagnosis not present

## 2017-11-09 DIAGNOSIS — R4182 Altered mental status, unspecified: Secondary | ICD-10-CM | POA: Diagnosis present

## 2017-11-09 DIAGNOSIS — Z79899 Other long term (current) drug therapy: Secondary | ICD-10-CM | POA: Diagnosis not present

## 2017-11-09 DIAGNOSIS — R2689 Other abnormalities of gait and mobility: Secondary | ICD-10-CM | POA: Diagnosis not present

## 2017-11-09 DIAGNOSIS — E860 Dehydration: Secondary | ICD-10-CM | POA: Diagnosis not present

## 2017-11-09 DIAGNOSIS — R41841 Cognitive communication deficit: Secondary | ICD-10-CM | POA: Diagnosis not present

## 2017-11-09 DIAGNOSIS — R5381 Other malaise: Secondary | ICD-10-CM | POA: Diagnosis not present

## 2017-11-09 DIAGNOSIS — R410 Disorientation, unspecified: Secondary | ICD-10-CM | POA: Diagnosis not present

## 2017-11-09 DIAGNOSIS — R0902 Hypoxemia: Secondary | ICD-10-CM | POA: Diagnosis not present

## 2017-11-09 DIAGNOSIS — R296 Repeated falls: Secondary | ICD-10-CM | POA: Diagnosis not present

## 2017-11-09 NOTE — Clinical Social Work Placement (Signed)
[facility], report [phone]. Arranged [facility] transportation 4:30pm. Wife aware and will be bringing pt belongings.  Dc information provided via the [employer]- [employer] insurance provided approved authorization.   CLINICAL SOCIAL WORK PLACEMENT  NOTE  Date:  11/09/2017  Patient Details  Name: Sherril CongJames B Reardon MRN: [id number] Date of Birth: 10/06/1940  Clinical Social Work is seeking post-discharge placement for this patient at the Skilled  Nursing Facility level of care (*CSW will initial, date and re-position this form in  chart as items are completed):  Yes   Patient/family provided with [place] Clinical Social Work Department's list of facilities offering this level of care within the geographic area requested by the patient (or if unable, by the patient's family).  Yes   Patient/family informed of their freedom to choose among providers that offer the needed level of care, that participate in Medicare, Medicaid or managed care program needed by the patient, have an available bed and are willing to accept the patient.  Yes   Patient/family informed of [facility] ownership interest in [facility] and [facility], as well as of the fact that they are under no obligation to receive care at these facilities.  PASRR submitted to EDS on       PASRR number received on       Existing PASRR number confirmed on 11/06/17([id number])     FL2 transmitted to all facilities in geographic area requested by pt/family on       FL2 transmitted to all facilities within larger geographic area on       Patient informed that his/her managed care company has contracts with or will negotiate with certain facilities, including the following:            Patient/family informed of bed offers received.  Patient chooses bed at       Physician recommends and patient chooses bed at      Patient to be transferred to   on  .  Patient to be transferred to facility by        Patient family notified on   of transfer.  Name of family member notified:        PHYSICIAN       Additional Comment:    _______________________________________________ Nelwyn SalisburyMeghan R Onita Pfluger, LCSW 11/09/2017, 3:45 PM [phone]

## 2017-11-09 NOTE — Consult Note (Addendum)
WOC Nurse wound consult note Reason for Consult: Consult requested for buttocks and scrotum.  Pt was previously noted to have moisture associated skin damage to these locations  In the EMR notes and they were described as bright red and macerated. Wound type: Appearance has greatly improved since previous description.  Pt is occasionally incontinent of stool.  Buttocks are no longer bright red or macerated; scrotum is slightly reddened in patchy areas. Dressing procedure/placement/frequency: Continue present plan of care with barrier cream and antifungal powder to protect and promote healing.  Discussed plan of care with family member at the bedside. Please re-consult if further assistance is needed.  Thank-you,  Cammie Mcgeeawn Bralynn Velador MSN, RN, CWOCN, RainsWCN-AP, CNS [phone]

## 2017-11-09 NOTE — Progress Notes (Signed)
[name] wife selects [facility] from bed offers. CSW confirmed with facility and initiated [facility] [employer] [facility] authorization request.  Ilean SkillMeghan Takeia Ciaravino, MSW, LCSW Clinical Social Work 11/09/2017 [phone]

## 2017-11-09 NOTE — Discharge Summary (Signed)
Discharge Summary  [name] ZOX:[id number] DOB: [birth year]  PCP: Kristian Covey, MD  Admit date: 11/05/2017 Discharge date: 11/09/2017  Time spent: 25 minutes  UPDATE: Discharge delayed due to awaiting insurance authorization.  Recommendations for Outpatient Follow-up:  1. Continue physical therapy 2. Follow-up with neurology for your Parkinson disease 3. Follow-up with PCP 4. Take your medications as prescribed  Discharge Diagnoses:  Active Hospital Problems   Diagnosis Date Noted  . Sepsis (HCC) 11/05/2017    Resolved Hospital Problems  No resolved problems to display.    Discharge Condition: Stable  Diet recommendation: Resume previous diet  Vitals:   11/08/17 2051 11/09/17 0500  BP: 126/64 121/68  Pulse: 68 77  Resp: 17 18  Temp: 99.4 F (37.4 C) 99.1 F (37.3 C)  SpO2: 96% 97%    History of present illness:  [name] a 76 y.o.malewith medical history significant forParkinson disease, urinary incontinence, chronic anxiety/depression, GERD who presented to ED [facility] due to generalized weakness and recurrent falls. Patient is a poor historian and reports that weakness has been progressively getting worse in the past 2 weeks. Endorses a fever at home. Associated with polyuria. States symptoms feel like previous UTI. Patient fell yesterday and went to ED med Center but UA was negative. [name] again this morning. States he felt like he was about tofall andloss his balance. No family members present at the time of this visit. Denies dizziness, nausea, abdominal pain, chest pain, dyspnea, or palpitations.  -11/06/2017: Assessed by PT with recommendation for SNF and 24-hour supervision/assistance.  -11/07/2017: Generalized weakness.  Cough is improving.  Denies chest pain, dyspnea or palpitation. -11/08/2017: Leukocytosis resolved.  Lactic acid 0.8. Complaints of significant back pain, cervical thoracic lumbar spine unremarkable for any acute  findings.  11/09/17: Denies back pain, chest pain, dyspnea or palpitations. No new complaints.  Patient will need to continue physical therapy/occupational therapy in order to improve his functional status.  Patient is hemodynamically stable.  We will transition to oral antibiotic keflex day#2/5 days.  Awaiting insurance authorization for discharge to short-term rehab.  Hospital Course:  Active Problems:   Sepsis (HCC)  Sepsis secondary to complicated UTI Sepsis physiology is resolving.  WBC 3.9 and lactic acid 0.8.  Vital signs stable.  No signs of Sirs criteria Urine culture revealed greater than 100,000 colonies E. coli, pansensitive. Completed 3 days of IV ceftriaxone empirically Continue Keflex 500 mg 3 times daily day 2/5 days Continue to monitor fever curve Continue to monitor WBC Obtain CBC in the morning  Resolved leukocytosis Completed IV antibiotics Continue to monitor  Resolving Gross hematuria suspect secondary to trauma Per the patient's wife patient had a in and out Foley cath placed in 2 days ago which caused him to bleed from his urethra. Continue gentle IV fluid hydration  Improving chronic cough Continue to treat symptomatically  No sign of active infective process On duo nebs every 6 hours as needed On Mucinex and hypersaline nebs for pulmonary toilet  Parkinson disease Continue home medications Follows with neurology outpatient Fall precautions  Ambulatory dysfunction with recurrent falls/physical debility/generalized weakness Continue to work with PT PT recommend SNF and 24-hour supervision/assistance due to gait unsteadiness and balance issues  Social worker consulted for SNF placement  Generalized weakness/anxiety never filled his prescription for escitalopram Discontinue escitalopram Encourage increase of protein calorie intake Continue working with physical therapy  GERD Continue PPI  Risks: Patient is unsafe discharge to his home  due to high risk for  fall and possibly bleeding from falls in an elderly patient with Parkinson's disease.     Code Status: Full code  Family Communication: Spoke to wife on the phone on 11/06/2017 and 11/07/2017 who works as an NP for dialysis center- [name] can be reached at [phone].  Disposition Plan: Possibly SNF, pending insurance authorization.   Consultants:  None  Procedures:  None  Antimicrobials:  Completed 3 days of Rocephin  Keflex x 5 days  DVT prophylaxis: Subcu Lovenox   Discharge Exam: BP 121/68 (BP Location: Left Arm)   Pulse 77   Temp 99.1 F (37.3 C) (Oral)   Resp 18   Ht 6\' 2"  (1.88 m)   Wt 118.2 kg   SpO2 97%   BMI 33.46 kg/m  . General: 77 y.o. year-old male well developed well nourished in no acute distress.  Alert and interactive. . Cardiovascular: Regular rate and rhythm with no rubs or gallops.  No thyromegaly or JVD noted.   Marland Kitchen Respiratory: Clear to auscultation with no wheezes or rales. Good inspiratory effort. . Abdomen: Soft nontender nondistended with normal bowel sounds x4 quadrants. . Psychiatry: Mood is appropriate for condition and setting  Discharge Instructions You were cared for by a hospitalist during your hospital stay. If you have any questions about your discharge medications or the care you received while you were in the hospital after you are discharged, you can call the unit and asked to speak with the hospitalist on call if the hospitalist that took care of you is not available. Once you are discharged, your primary care physician will handle any further medical issues. Please note that NO REFILLS for any discharge medications will be authorized once you are discharged, as it is imperative that you return to your primary care physician (or establish a relationship with a primary care physician if you do not have one) for your aftercare needs so that they can reassess your need for medications and monitor your  lab values.   Allergies as of 11/09/2017   No Known Allergies     Medication List    STOP taking these medications   doxycycline 100 MG capsule Commonly known as:  VIBRAMYCIN   escitalopram 10 MG tablet Commonly known as:  LEXAPRO     TAKE these medications   carbidopa-levodopa-entacapone 25-100-200 MG tablet Commonly known as:  STALEVO One pill 7 times a day, at 6, 9, 12, 3 PM, 6 PM, 9 PM and 11 PM. What changed:    how much to take  how to take this  when to take this   cephALEXin 500 MG capsule Commonly known as:  KEFLEX Take 1 capsule (500 mg total) by mouth 3 (three) times daily for 5 days.   Guaifenesin 1200 MG Tb12 Take 1 tablet (1,200 mg total) by mouth 2 (two) times daily. What changed:    when to take this  reasons to take this   liver oil-zinc oxide 40 % ointment Commonly known as:  DESITIN Apply topically as needed for irritation.   omeprazole 20 MG capsule Commonly known as:  PRILOSEC Take 20 mg by mouth daily.      No Known Allergies Follow-up Information    Kristian Covey, MD. Call in 1 day(s).   Specialty:  Family Medicine Why:  Please call for a post hospital follow-up appointment. Contact information: [address] [place] [address] [phone]            The results of significant diagnostics  from this hospitalization (including imaging, microbiology, ancillary and laboratory) are listed below for reference.    Significant Diagnostic Studies: Dg Chest 2 View  Result Date: 11/05/2017 CLINICAL DATA:  Possible sepsis EXAM: CHEST - 2 VIEW COMPARISON:  11/04/2017, 06/28/2017 FINDINGS: Borderline cardiomegaly. Both lungs are clear. Old left fifth rib fracture. Aortic atherosclerosis. Degenerative changes of the spine. IMPRESSION: No active cardiopulmonary disease. Electronically Signed   By: Jasmine Pang M.D.   On: 11/05/2017 14:52   Dg Chest 2 View  Result Date: 11/04/2017 CLINICAL DATA:  Pain following fall  EXAM: CHEST - 2 VIEW COMPARISON:  Jun 28, 2017 FINDINGS: Lungs are clear. Heart size and pulmonary vascularity are normal. No adenopathy. There is aortic atherosclerosis. There is degenerative change in the thoracic spine. No pneumothorax. IMPRESSION: No edema or consolidation. Stable cardiac silhouette. There is aortic atherosclerosis. No evident pneumothorax. Aortic Atherosclerosis (ICD10-I70.0). Electronically Signed   By: Bretta Bang III M.D.   On: 11/04/2017 07:56   Dg Cervical Spine 2 Or 3 Views  Result Date: 11/08/2017 CLINICAL DATA:  multiple falls at home and is experiencing weakness that is getting worse over time. Recent fever at home. Pt states severe lower back pain "making me want to die". Pt also c/o neck and upper back pain from falls along with it being a chronic pain issue. EXAM: CERVICAL SPINE - 2-3 VIEW COMPARISON:  CT 06/28/2017 FINDINGS: Reversal of normal cervical lordosis. Exuberant anterior spurring C4 to C7. No definite fracture. The C5-6 and C6-7 interspaces are poorly visualized, possibly fused. Multilevel facet DJD. No prevertebral soft tissue swelling IMPRESSION: 1. Negative for fracture or other acute bone abnormality. 2. Chronic Loss of the normal cervical spine lordosis, which may be secondary to positioning, spasm, or soft tissue injury. 3. Multilevel spondylitic changes as above Electronically Signed   By: Corlis Leak M.D.   On: 11/08/2017 20:01   Dg Thoracic Spine 2 View  Result Date: 11/08/2017 CLINICAL DATA:  multiple falls at home and is experiencing weakness that is getting worse over time. Recent fever at home. Pt states severe lower back pain "making me want to die". Pt also c/o neck and upper back pain from falls along with it being a chronic pain issue. EXAM: THORACIC SPINE 2 VIEWS COMPARISON:  11/05/2017 FINDINGS: Negative for fracture. Normal alignment. Anterior vertebral endplate spurring at multiple levels in the mid and lower thoracic spine. IMPRESSION:  No acute findings Electronically Signed   By: Corlis Leak M.D.   On: 11/08/2017 20:08   Dg Lumbar Spine 2-3 Views  Result Date: 11/08/2017 CLINICAL DATA:  multiple falls at home and is experiencing weakness that is getting worse over time. Recent fever at home. Pt states severe lower back pain "making me want to die". Pt also c/o neck and upper back pain from falls along with it being a chronic pain issue. EXAM: LUMBAR SPINE - 2-3 VIEW COMPARISON:  MR 02/05/2010 and previous FINDINGS: Negative for fracture. Mild grade 1 anterolisthesis L4-5. Anterior endplate spurring at all levels, most exuberant L4-S1. Facet DJD in the lower lumbar spine most marked from L4-S1. There is mild narrowing of the L3-4 interspace. IMPRESSION: 1. Negative for fracture or other acute bone abnormality. 2. Multilevel spondylitic changes as above Electronically Signed   By: Corlis Leak M.D.   On: 11/08/2017 19:58   Ct Head Wo Contrast  Result Date: 11/05/2017 CLINICAL DATA:  Increased falls in the past week. Fever. History of Parkinson's. EXAM: CT HEAD WITHOUT CONTRAST TECHNIQUE:  Contiguous axial images were obtained from the base of the skull through the vertex without intravenous contrast. COMPARISON:  11/04/2017 FINDINGS: Brain: There is no evidence of acute infarct, intracranial hemorrhage, mass, midline shift, or extra-axial fluid collection. Cerebral atrophy is mild-to-moderate for age. Cerebral white matter hypodensities are unchanged and nonspecific but compatible with mild chronic small vessel ischemic disease. Vascular: Calcified atherosclerosis at the skull base. No hyperdense vessel. Skull: No fracture or focal osseous lesion. Sinuses/Orbits: Visualized paranasal sinuses and mastoid air cells are clear. Bilateral cataract extraction. Other: None. IMPRESSION: 1. No evidence of acute intracranial abnormality. 2. Mild-to-moderate chronic small vessel ischemic disease. Electronically Signed   By: Sebastian Ache M.D.   On:  11/05/2017 16:30   Ct Head Wo Contrast  Result Date: 11/04/2017 CLINICAL DATA:  Pain following fall EXAM: CT HEAD WITHOUT CONTRAST CT MAXILLOFACIAL WITHOUT CONTRAST TECHNIQUE: Multidetector CT imaging of the head and maxillofacial structures were performed using the standard protocol without intravenous contrast. Multiplanar CT image reconstructions of the maxillofacial structures were also generated. COMPARISON:  Head CT Jun 28, 2017 FINDINGS: CT HEAD FINDINGS Brain: There is age related volume loss, stable. There is no evident mass, hemorrhage, extra-axial fluid collection, or midline shift. Patchy small vessel disease in the centra semiovale bilaterally is stable. No new gray-white compartment lesions are evident. No evident acute infarct. Vascular: There is no hyperdense vessel. There is calcification in each carotid siphon region. There is calcification in the distal right vertebral artery. Skull: Bony calvarium appears intact. There is a left parietal scalp hematoma. Other: Mastoid air cells are clear. There is debris in each external auditory canal, more on the left than on the right. CT MAXILLOFACIAL FINDINGS Osseous: There is no evident fracture or dislocation. No blastic or lytic bone lesions. Orbits: There is preseptal soft tissue swelling over the left orbit. There is no intraorbital lesion. Intraorbital contents appear symmetric bilaterally. Globes bilaterally appear intact. Sinuses: There is slight mucosal thickening in the inferolateral left maxillary antrum. There is mucosal thickening in several ethmoid air cells. Other paranasal sinuses are clear. No air-fluid level. No bony destruction or expansion. Ostiomeatal unit complexes are patent bilaterally. There is leftward deviation of the nasal septum. Nares are not obstructed on either side. Soft tissues: There is soft tissue swelling over the medial upper face and preseptal orbital region. No other soft tissue swelling or hematoma. No abscess.  Visualized salivary glands appear symmetric bilaterally. There is no adenopathy. Tongue and tongue base regions appear normal. Visualize pharynx appears normal. There is pannus posterior to the odontoid. Visualized upper cervical spine appears intact. There is left and right carotid artery calcification. IMPRESSION: Head CT: Age related volume loss with patchy supratentorial small vessel disease. No acute infarct. No mass or hemorrhage. There are foci of arterial vascular calcification. There is a left parietal scalp hematoma. No fracture. There is probable cerumen in the external auditory canals, more severe on the left than on the right. CT maxillofacial: 1.  No evident fracture or dislocation. 2. No intraorbital lesions. There is preseptal soft tissue swelling over the left orbit. 3. Left upper facial swelling. No well-defined hematoma. No abscess. 4. Mucosal thickening in several ethmoid air cells. Slight mucosal thickening in the inferolateral left maxillary antrum. Other paranasal sinuses are clear. Ostiomeatal unit complexes are patent bilaterally. There is leftward deviation of the nasal septum. Electronically Signed   By: Bretta Bang III M.D.   On: 11/04/2017 08:04   US Renal  Result Date: 11/06/2017 CLINICAL  DATA:  Hematuria. EXAM: RENAL / URINARY TRACT ULTRASOUND COMPLETE COMPARISON:  None. FINDINGS: Right Kidney: Length: 11.72 cm. Echogenicity within normal limits. No mass or hydronephrosis visualized. Left Kidney: Length: 11.98 cm. Echogenicity within normal limits. No mass or hydronephrosis visualized. Bladder: Appears normal for degree of bladder distention. IMPRESSION: Negative exam. Electronically Signed   By: Elsie StainJohn T Curnes M.D.   On: 11/06/2017 18:55   Ct Maxillofacial Wo Contrast  Result Date: 11/04/2017 CLINICAL DATA:  Pain following fall EXAM: CT HEAD WITHOUT CONTRAST CT MAXILLOFACIAL WITHOUT CONTRAST TECHNIQUE: Multidetector CT imaging of the head and maxillofacial structures were  performed using the standard protocol without intravenous contrast. Multiplanar CT image reconstructions of the maxillofacial structures were also generated. COMPARISON:  Head CT Jun 28, 2017 FINDINGS: CT HEAD FINDINGS Brain: There is age related volume loss, stable. There is no evident mass, hemorrhage, extra-axial fluid collection, or midline shift. Patchy small vessel disease in the centra semiovale bilaterally is stable. No new gray-white compartment lesions are evident. No evident acute infarct. Vascular: There is no hyperdense vessel. There is calcification in each carotid siphon region. There is calcification in the distal right vertebral artery. Skull: Bony calvarium appears intact. There is a left parietal scalp hematoma. Other: Mastoid air cells are clear. There is debris in each external auditory canal, more on the left than on the right. CT MAXILLOFACIAL FINDINGS Osseous: There is no evident fracture or dislocation. No blastic or lytic bone lesions. Orbits: There is preseptal soft tissue swelling over the left orbit. There is no intraorbital lesion. Intraorbital contents appear symmetric bilaterally. Globes bilaterally appear intact. Sinuses: There is slight mucosal thickening in the inferolateral left maxillary antrum. There is mucosal thickening in several ethmoid air cells. Other paranasal sinuses are clear. No air-fluid level. No bony destruction or expansion. Ostiomeatal unit complexes are patent bilaterally. There is leftward deviation of the nasal septum. Nares are not obstructed on either side. Soft tissues: There is soft tissue swelling over the medial upper face and preseptal orbital region. No other soft tissue swelling or hematoma. No abscess. Visualized salivary glands appear symmetric bilaterally. There is no adenopathy. Tongue and tongue base regions appear normal. Visualize pharynx appears normal. There is pannus posterior to the odontoid. Visualized upper cervical spine appears intact.  There is left and right carotid artery calcification. IMPRESSION: Head CT: Age related volume loss with patchy supratentorial small vessel disease. No acute infarct. No mass or hemorrhage. There are foci of arterial vascular calcification. There is a left parietal scalp hematoma. No fracture. There is probable cerumen in the external auditory canals, more severe on the left than on the right. CT maxillofacial: 1.  No evident fracture or dislocation. 2. No intraorbital lesions. There is preseptal soft tissue swelling over the left orbit. 3. Left upper facial swelling. No well-defined hematoma. No abscess. 4. Mucosal thickening in several ethmoid air cells. Slight mucosal thickening in the inferolateral left maxillary antrum. Other paranasal sinuses are clear. Ostiomeatal unit complexes are patent bilaterally. There is leftward deviation of the nasal septum. Electronically Signed   By: Bretta BangWilliam  Woodruff III M.D.   On: 11/04/2017 08:04    Microbiology: Recent Results (from the past 240 hour(s))  Culture, blood (Routine x 2)     Status: None (Preliminary result)   Collection Time: 11/05/17  2:34 PM  Result Value Ref Range Status   Specimen Description   Final    BLOOD RIGHT HAND Performed at [facility], [address] [place], [place]  [address]    Special Requests   Final    BOTTLES DRAWN AEROBIC AND ANAEROBIC Blood Culture adequate volume Performed at [facility], [address], [place], [place] [address]    Culture   Final    NO GROWTH 4 DAYS Performed at [facility], [address] [place], [place] [address]    Report Status PENDING  Incomplete  Culture, blood (Routine x 2)     Status: None (Preliminary result)   Collection Time: 11/05/17  2:34 PM  Result Value Ref Range Status   Specimen Description   Final    BLOOD BLOOD LEFT FOREARM Performed at [facility], [address] [place], [place] [address]    Special  Requests   Final    BOTTLES DRAWN AEROBIC AND ANAEROBIC Blood Culture adequate volume Performed at [facility], [address] [place], [place] [address]    Culture   Final    NO GROWTH 4 DAYS Performed at [facility], [address] [place], [place] [address]    Report Status PENDING  Incomplete  Urine culture     Status: Abnormal   Collection Time: 11/05/17  2:34 PM  Result Value Ref Range Status   Specimen Description   Final    URINE, CATHETERIZED Performed at [facility], [address], [place], [place] [address]    Special Requests   Final    NONE Performed at [facility], [address] [place], [place] [address]    Culture >=100,000 COLONIES/mL ESCHERICHIA COLI (A)  Final   Report Status 11/07/2017 FINAL  Final   Organism ID, Bacteria ESCHERICHIA COLI (A)  Final      Susceptibility   Escherichia coli - MIC*    AMPICILLIN <=2 SENSITIVE Sensitive     CEFAZOLIN <=4 SENSITIVE Sensitive     CEFTRIAXONE <=1 SENSITIVE Sensitive     CIPROFLOXACIN <=0.25 SENSITIVE Sensitive     GENTAMICIN <=1 SENSITIVE Sensitive     IMIPENEM <=0.25 SENSITIVE Sensitive     NITROFURANTOIN <=16 SENSITIVE Sensitive     TRIMETH/SULFA <=20 SENSITIVE Sensitive     AMPICILLIN/SULBACTAM <=2 SENSITIVE Sensitive     PIP/TAZO <=4 SENSITIVE Sensitive     Extended ESBL NEGATIVE Sensitive     * >=100,000 COLONIES/mL ESCHERICHIA COLI     Labs: Basic Metabolic Panel: Recent Labs  Lab 11/04/17 0755 11/05/17 1547 11/06/17 0431 11/07/17 0527  NA 138 139 138  --   K 4.1 3.9 3.7  --   CL 103 107 109  --   CO2 35* 24 23  --   GLUCOSE 97 92 92  --   BUN 25* 25* 19  --   CREATININE 0.80 0.73 0.77  --   CALCIUM 8.4* 8.5* 7.8*  --   MG  --   --  1.5* 2.2   Liver Function Tests: Recent Labs  Lab 11/04/17 0755 11/05/17 1547  AST 20 21  ALT <5 5  ALKPHOS 74 69  BILITOT 1.1 1.1  PROT 7.0 6.6  ALBUMIN 3.8 3.7   No results for  input(s): LIPASE, AMYLASE in the last 168 hours. No results for input(s): AMMONIA in the last 168 hours. CBC: Recent Labs  Lab 11/04/17 0722 11/05/17 1434 11/06/17 0431 11/07/17 0527 11/08/17 0515  WBC 5.6 14.6* 21.7* 11.7* 3.9*  NEUTROABS 3.8 14.2*  --   --   --   HGB 14.4 14.3 12.1* 12.3* 12.3*  HCT 42.2 42.5 36.5* 36.8* 36.0*  MCV 87.9 88.9 89.5 88.9 87.6  PLT 144* 166 123* 115* 105*   Cardiac Enzymes: Recent Labs  Lab 11/04/17 0713 11/05/17 1434  TROPONINI <0.03 <0.03   BNP: BNP (last 3 results) No results for input(s): BNP in the last 8760 hours.  ProBNP (last 3 results) No results for input(s): PROBNP in the last 8760 hours.  CBG: No results for input(s): GLUCAP in the last 168 hours.     Signed:  Darlin Drop, MD [facility] 11/09/2017, 11:03 AM

## 2017-11-10 ENCOUNTER — Ambulatory Visit: Payer: Self-pay | Admitting: Neurology

## 2017-11-10 DIAGNOSIS — R05 Cough: Secondary | ICD-10-CM | POA: Diagnosis not present

## 2017-11-10 DIAGNOSIS — M549 Dorsalgia, unspecified: Secondary | ICD-10-CM | POA: Diagnosis not present

## 2017-11-10 DIAGNOSIS — G20 Parkinson's disease: Secondary | ICD-10-CM | POA: Diagnosis not present

## 2017-11-10 DIAGNOSIS — A419 Sepsis, unspecified organism: Secondary | ICD-10-CM | POA: Diagnosis not present

## 2017-11-10 LAB — CULTURE, BLOOD (ROUTINE X 2)
CULTURE: NO GROWTH
Culture: NO GROWTH
Special Requests: ADEQUATE
Special Requests: ADEQUATE

## 2017-11-11 DIAGNOSIS — R05 Cough: Secondary | ICD-10-CM | POA: Diagnosis not present

## 2017-11-11 DIAGNOSIS — A419 Sepsis, unspecified organism: Secondary | ICD-10-CM | POA: Diagnosis not present

## 2017-11-11 DIAGNOSIS — R531 Weakness: Secondary | ICD-10-CM | POA: Diagnosis not present

## 2017-11-20 ENCOUNTER — Encounter (HOSPITAL_COMMUNITY): Payer: Self-pay | Admitting: Emergency Medicine

## 2017-11-20 ENCOUNTER — Emergency Department (HOSPITAL_COMMUNITY): Payer: PPO

## 2017-11-20 ENCOUNTER — Other Ambulatory Visit: Payer: Self-pay

## 2017-11-20 ENCOUNTER — Emergency Department (HOSPITAL_COMMUNITY)
Admission: EM | Admit: 2017-11-20 | Discharge: 2017-11-20 | Disposition: A | Discharge status: Home / Self Care | Payer: PPO | Attending: Emergency Medicine | Admitting: Emergency Medicine

## 2017-11-20 DIAGNOSIS — Z79899 Other long term (current) drug therapy: Secondary | ICD-10-CM | POA: Insufficient documentation

## 2017-11-20 DIAGNOSIS — Z87891 Personal history of nicotine dependence: Secondary | ICD-10-CM | POA: Diagnosis not present

## 2017-11-20 DIAGNOSIS — R410 Disorientation, unspecified: Secondary | ICD-10-CM | POA: Diagnosis not present

## 2017-11-20 DIAGNOSIS — G20 Parkinson's disease: Secondary | ICD-10-CM | POA: Diagnosis not present

## 2017-11-20 DIAGNOSIS — E860 Dehydration: Secondary | ICD-10-CM | POA: Insufficient documentation

## 2017-11-20 DIAGNOSIS — R531 Weakness: Secondary | ICD-10-CM | POA: Diagnosis not present

## 2017-11-20 DIAGNOSIS — A419 Sepsis, unspecified organism: Secondary | ICD-10-CM | POA: Diagnosis not present

## 2017-11-20 LAB — CBC WITH DIFFERENTIAL/PLATELET
Basophils Absolute: 0 10*3/uL (ref 0.0–0.1)
Basophils Relative: 0 %
EOS PCT: 0 %
Eosinophils Absolute: 0 10*3/uL (ref 0.0–0.7)
HCT: 40 % (ref 39.0–52.0)
Hemoglobin: 13.3 g/dL (ref 13.0–17.0)
LYMPHS ABS: 1.4 10*3/uL (ref 0.7–4.0)
LYMPHS PCT: 13 %
MCH: 29.3 pg (ref 26.0–34.0)
MCHC: 33.3 g/dL (ref 30.0–36.0)
MCV: 88.1 fL (ref 78.0–100.0)
MONO ABS: 1.5 10*3/uL — AB (ref 0.1–1.0)
Monocytes Relative: 14 %
Neutro Abs: 7.8 10*3/uL — ABNORMAL HIGH (ref 1.7–7.7)
Neutrophils Relative %: 73 %
PLATELETS: 242 10*3/uL (ref 150–400)
RBC: 4.54 MIL/uL (ref 4.22–5.81)
RDW: 13.7 % (ref 11.5–15.5)
WBC: 10.8 10*3/uL — ABNORMAL HIGH (ref 4.0–10.5)

## 2017-11-20 LAB — COMPREHENSIVE METABOLIC PANEL
ALT: 8 U/L (ref 0–44)
AST: 19 U/L (ref 15–41)
Albumin: 3.5 g/dL (ref 3.5–5.0)
Alkaline Phosphatase: 68 U/L (ref 38–126)
Anion gap: 9 (ref 5–15)
BUN: 22 mg/dL (ref 8–23)
CHLORIDE: 107 mmol/L (ref 98–111)
CO2: 24 mmol/L (ref 22–32)
CREATININE: 0.78 mg/dL (ref 0.61–1.24)
Calcium: 8.9 mg/dL (ref 8.9–10.3)
Glucose, Bld: 115 mg/dL — ABNORMAL HIGH (ref 70–99)
POTASSIUM: 4.3 mmol/L (ref 3.5–5.1)
Sodium: 140 mmol/L (ref 135–145)
Total Bilirubin: 1.3 mg/dL — ABNORMAL HIGH (ref 0.3–1.2)
Total Protein: 6.7 g/dL (ref 6.5–8.1)

## 2017-11-20 LAB — TROPONIN I: Troponin I: <0.03 ng/mL (ref <0.03)

## 2017-11-20 LAB — URINALYSIS, ROUTINE W REFLEX MICROSCOPIC
BILIRUBIN URINE: NEGATIVE
Bacteria, UA: NONE SEEN
Glucose, UA: NEGATIVE mg/dL
Ketones, ur: 5 mg/dL — AB
Leukocytes, UA: NEGATIVE
NITRITE: NEGATIVE
Protein, ur: NEGATIVE mg/dL
SPECIFIC GRAVITY, URINE: 1.023 (ref 1.005–1.030)
pH: 6 (ref 5.0–8.0)

## 2017-11-20 LAB — CBG MONITORING, ED: GLUCOSE-CAPILLARY: 118 mg/dL — AB (ref 70–99)

## 2017-11-20 LAB — I-STAT CG4 LACTIC ACID, ED: LACTIC ACID, VENOUS: 0.78 mmol/L (ref 0.5–1.9)

## 2017-11-20 MED ORDER — ACETAMINOPHEN 500 MG PO TABS
1000.0000 mg | ORAL_TABLET | Freq: Once | ORAL | Status: AC
  Administered 2017-11-20: 1000 mg via ORAL
  Filled 2017-11-20: qty 2

## 2017-11-20 NOTE — ED Notes (Signed)
Attempted to call [facility] to give report with no answer.

## 2017-11-20 NOTE — ED Notes (Signed)
Patient given a sandwich.

## 2017-11-20 NOTE — ED Notes (Signed)
Bed: WA02 Expected date:  Expected time:  Means of arrival:  Comments: EMS-decreased LOC

## 2017-11-20 NOTE — ED Notes (Signed)
[facility] called for transport.  

## 2017-11-20 NOTE — ED Provider Notes (Signed)
[facility]-EMERGENCY DEPT Provider Note   CSN: [id number] Arrival date & time: 11/20/17  1618     History   Chief Complaint Chief Complaint  Patient presents with  . Weakness    HPI [name] is a 77 y.o. male.  Pt presents today with altered mental status.  The pt was admitted recently for urosepsis.  He went to [facility] and his wife has noticed a decline of health.  The pt has not had a fever and has no complaints.     Past Medical History:  Diagnosis Date  . Fall   . Hypothyroidism   . Morbid obesity (HCC) 07/14/2012  . Obesity   . Parkinson disease (HCC)   . UTI (urinary tract infection)   . Venous stasis    edema    Patient Active Problem List   Diagnosis Date Noted  . Sepsis (HCC) 11/05/2017  . Recurrent falls 08/10/2014  . Falls 08/10/2014  . Weakness   . Parkinson disease (HCC)   . Morbid obesity (HCC) 07/14/2012  . WEAKNESS 01/30/2010  . URINARY INCONTINENCE 01/30/2010  . DYSURIA 11/13/2009  . PARKINSON'S DISEASE 01/29/2009    Past Surgical History:  Procedure Laterality Date  . anterior cervical discectomy C6-7    . APPENDECTOMY    . CERVICAL LAMINECTOMY    . FOOT SURGERY  1992   right foot repair        Home Medications    Prior to Admission medications   Medication Sig Start Date End Date Taking? Authorizing Provider  carbidopa-levodopa-entacapone (STALEVO) 25-100-200 MG tablet One pill 7 times a day, at 6, 9, 12, 3 PM, 6 PM, 9 PM and 11 PM. Patient taking differently: Take 1 tablet by mouth See admin instructions. One pill 7 times a day, at 6, 9, 12, 3 PM, 6 PM, 9 PM and 11 PM. 07/06/17  Yes Huston Foley, MD  GUAIFENESIN PO Take 400 mg by mouth 2 (two) times daily.   Yes [provider]  omeprazole (PRILOSEC) 20 MG capsule Take 20 mg by mouth daily.   Yes [provider]  Guaifenesin 1200 MG TB12 Take 1 tablet (1,200 mg total) by mouth 2 (two) times daily. Patient not taking: Reported on  11/20/2017 09/22/16   Charlestine Night, PA-C  liver oil-zinc oxide (DESITIN) 40 % ointment Apply topically as needed for irritation. 11/08/17   Darlin Drop, DO    Family History Family History  Problem Relation Age of Onset  . Heart disease Mother   . Hyperlipidemia Mother   . Colon cancer Father   . Alcohol abuse Other   . Cancer Sister        breast     Social History Social History   Tobacco Use  . Smoking status: Former Smoker    Years: 2.00    Types: Cigarettes    Last attempt to quit: 03/26/1992    Years since quitting: 25.6  . Smokeless tobacco: Never Used  Substance Use Topics  . Alcohol use: No    Alcohol/week: 0.0 standard drinks  . Drug use: No     Allergies   Patient has no known allergies.   Review of Systems Review of Systems  All other systems reviewed and are negative.    Physical Exam Updated Vital Signs BP 137/76   Pulse 82   Temp 98.2 F (36.8 C) (Oral)   Resp 14   Ht 6\' 2"  (1.88 m)   Wt 106.6 kg  SpO2 98%   BMI 30.17 kg/m   Physical Exam  Constitutional: He appears well-developed and well-nourished.  HENT:  Head: Normocephalic.  Right Ear: External ear normal.  Left Ear: External ear normal.  Nose: Nose normal.  Mouth/Throat: Oropharynx is clear and moist.  Bruising below left eye (old)  Eyes: Pupils are equal, round, and reactive to light. Conjunctivae and EOM are normal.  Neck: Normal range of motion. Neck supple.  Cardiovascular: Normal rate, regular rhythm, normal heart sounds and intact distal pulses.  Pulmonary/Chest: Effort normal and breath sounds normal.  Abdominal: Soft. Bowel sounds are normal.  Musculoskeletal: Normal range of motion.  Neurological: He is alert.  Pt knows his name and that he's at [facility] hospital.  He is unsure of the date.  He is moving all 4 extremities.  Skin: Skin is warm. Capillary refill takes less than 2 seconds.  Psychiatric: He has a normal mood and affect. His behavior is normal. Judgment  and thought content normal.  Nursing note and vitals reviewed.    ED Treatments / Results  Labs (all labs ordered are listed, but only abnormal results are displayed) Labs Reviewed  CBC WITH DIFFERENTIAL/PLATELET - Abnormal; Notable for the following components:      Result Value   WBC 10.8 (*)    Neutro Abs 7.8 (*)    Monocytes Absolute 1.5 (*)    All other components within normal limits  COMPREHENSIVE METABOLIC PANEL - Abnormal; Notable for the following components:   Glucose, Bld 115 (*)    Total Bilirubin 1.3 (*)    All other components within normal limits  URINALYSIS, ROUTINE W REFLEX MICROSCOPIC - Abnormal; Notable for the following components:   Color, Urine AMBER (*)    Hgb urine dipstick SMALL (*)    Ketones, ur 5 (*)    All other components within normal limits  CBG MONITORING, ED - Abnormal; Notable for the following components:   Glucose-Capillary 118 (*)    All other components within normal limits  URINE CULTURE  CULTURE, BLOOD (ROUTINE X 2)  CULTURE, BLOOD (ROUTINE X 2)  TROPONIN I  I-STAT CG4 LACTIC ACID, ED    EKG None  Radiology Dg Chest 2 View  Result Date: 11/20/2017 CLINICAL DATA:  Decline in patient's health, sepsis EXAM: CHEST - 2 VIEW COMPARISON:  11/05/2017, 06/28/2017 FINDINGS: No focal opacity or pleural effusion. Stable cardiomediastinal silhouette with aortic atherosclerosis. No pneumothorax. IMPRESSION: No active cardiopulmonary disease. Electronically Signed   By: Jasmine Pang M.D.   On: 11/20/2017 17:10   Ct Head Wo Contrast  Result Date: 11/20/2017 CLINICAL DATA:  Confusion.  Delirium. EXAM: CT HEAD WITHOUT CONTRAST TECHNIQUE: Contiguous axial images were obtained from the base of the skull through the vertex without intravenous contrast. COMPARISON:  11/05/2017 FINDINGS: Brain: No evidence of acute infarction, hemorrhage, hydrocephalus, extra-axial collection or mass lesion/mass effect. Moderate brain parenchymal volume loss. Mild  chronic small vessel ischemia. Vascular: Calcific atherosclerotic disease of the intra cavernous carotid arteries. Skull: Normal. Negative for fracture or focal lesion. Sinuses/Orbits: No acute finding. Other: None. IMPRESSION: Study affected by motion artifact. No acute intracranial abnormality. Atrophy, chronic microvascular disease. Electronically Signed   By: Ted Mcalpine M.D.   On: 11/20/2017 17:06    Procedures Procedures (including critical care time)  Medications Ordered in ED Medications  acetaminophen (TYLENOL) tablet 1,000 mg (1,000 mg Oral Given 11/20/17 1932)     Initial Impression / Assessment and Plan / ED Course  I have reviewed the  triage vital signs and the nursing notes.  Pertinent labs & imaging results that were available during my care of the patient were reviewed by me and considered in my medical decision making (see chart for details).     No signs of infection here.  Pt able to eat and drink.  Pt is stable for d/c back to SNF.  Final Clinical Impressions(s) / ED Diagnoses   Final diagnoses:  Weakness  Dehydration    ED Discharge Orders    None       Jacalyn Lefevre, MD 11/20/17 Barry Brunner

## 2017-11-20 NOTE — ED Notes (Signed)
Report called to [facility].  

## 2017-11-20 NOTE — ED Triage Notes (Signed)
Patient arrived by EMS from [facility].Pts wife complains of a decline in patients health.  Pt had sepsis and treated withIV antibiotics recently. CBG 107, BP 108/76, HR 78, RR 16, SpO2 96%, and temperature of 98.2 f. Pt has 18 gauge lft AC.

## 2017-11-22 LAB — URINE CULTURE: CULTURE: NO GROWTH

## 2017-11-23 DIAGNOSIS — R410 Disorientation, unspecified: Secondary | ICD-10-CM | POA: Diagnosis not present

## 2017-11-25 DIAGNOSIS — R410 Disorientation, unspecified: Secondary | ICD-10-CM | POA: Diagnosis not present

## 2017-11-25 LAB — CULTURE, BLOOD (ROUTINE X 2)
Culture: NO GROWTH
Culture: NO GROWTH
SPECIAL REQUESTS: ADEQUATE
SPECIAL REQUESTS: ADEQUATE

## 2017-12-11 DIAGNOSIS — R05 Cough: Secondary | ICD-10-CM | POA: Diagnosis not present

## 2017-12-21 ENCOUNTER — Telehealth: Payer: Self-pay | Admitting: Neurology

## 2017-12-21 NOTE — Telephone Encounter (Signed)
I called pt's wife [place] S[place], per DPR. Pt was scheduled for 12/23/17 but pt's wife's schedule changed and she cannot make this appt. She reports that pt has had many recent ED visits d/t confusion. Pt is oriented to self and sometimes to place. This is a decline from when Dr. Frances Furbish last saw him in May. He now resides at [facility]. He was treated for a UTI 6 weeks ago but has not fully returned to his mental status prior to the UTI. I advised her that Dr. Frances Furbish will likely prefer to see the pt in an office visit to evaluate this change. [name] wife understands this and is asking to be worked in on either 11/7 or 11/11. I advised her that I will watch for cancellations but she also wants to know if Dr. Frances Furbish has any further recommendations for her.

## 2017-12-21 NOTE — Telephone Encounter (Signed)
I spoke with Dr. Frances Furbish. She doesn't recommend anything right now except an appt with the NP to complete an MMSE. I called pt's wife and offered her an appt for pt on 12/24/17 at 8:45am with Eber Jones, NP, check in at 8:15am, which pt's wife accepted. [name] wife verbalized understanding of appt date and time.

## 2017-12-21 NOTE — Telephone Encounter (Signed)
Pt wife(on DPR-Lingelbach,Rita) has called to inform that pt is not the same from when Dr Frances Furbish saw him.  He is in a state of confusion.  Pt was treated for a UTI but it has been over 6 weeks and pt has not come back to himself.  Wife is asking for a call from RN

## 2017-12-23 ENCOUNTER — Ambulatory Visit: Payer: Self-pay | Admitting: Neurology

## 2017-12-23 NOTE — Progress Notes (Addendum)
[facility]  PATIENT: MAXIMO SPRATLING DOB: 1940/06/10   REASON FOR VISIT: Follow-up for Parkinson's disease worsening confusion HISTORY FROM: Patient and wife    HISTORY OF PRESENT ILLNESS: Mr. Nghiem is a very pleasant 77 year old left-handed gentleman with an underlying medical history of degenerative disc disease, degenerative neck disease with status post neck surgery, history of syncope in the past, overweight state, reflux disease, and recurrent UTIs, who presents for follow-up consultation of his advanced right-sided predominant Parkinson's disease dating back to 1996, complicated by dyskinesias, recurrent falls, recurrent UTI, prior medication intolerances, and overall decondition and challenges associated with advancing age and advancing Parkinson's disease. The patient is accompanied by his wife today and was not able to keep his appointment in April 2019. I last saw him on 11/20/2016 after a gap of over 1 year, at which time he reported more issues with his balance and more falls. He was mostly confined to wheelchair outside of the home and was using his walker inside the house. They changed from carpet to hardwood floor because it was difficult for him to maneuver the walker over carpet. Nevertheless, he had issues with recurrent cough and recurrent falls at time. We talked about the safety and the importance of fall risk management at the time. I did not suggest increase in his medication regimen at the time.  Today,07/06/2017 (all dictated new, as well as above notes, some dictation done in note pad or Word, outside of chart, may appear as copied):  He reportsVery little today. His wife reports that his fall frequency has increased drastically in the past couple of months. He has fallen despite people around him. He does not always have a recent fall, sometimes he bends over too much. He does not like to drink water, averages about 2 bottles per day per wife's  report and his struggling with his urinary incontinence. He is not able to really use a urinal. Of note, he had an emergency room visit on 04/25/2017 after he fell pushing a cart. He had an emergency room visit after fall on 06/28/2017 and presented with generalized weakness and fall the same day. Urinalysis and BMP suggested mild dehydration but he was not given IV fluids, advised to increase oral fluid intake. His urine culture showed no species present and recollection was advised.  He had a head and neck CT without contrast on 06/28/2017 and I reviewed the results: IMPRESSION: CT HEAD:  1. No acute intracranial process. Multiple small scalp hematomas. 2. Stable moderate parenchymal brain volume loss and moderate chronic small vessel ischemic disease.  CT CERVICAL SPINE:  1. No acute fracture. Grade 1 C3-4 anterolisthesis on degenerative basis. 2. Moderate canal stenosis C3-4, C5-6 and C6-7. Multilevel severe neural foraminal narrowing.  UPDATE 11/7/2019CM Mr. Krumholz, 77 year old male returns for follow-up with his wife.  He has had several ER visits recently one on 11/20/2017 for weakness found to be dehydrated.  See below CT of the head.  He also had ER visit September 18 for a fall and was admitted on 08/04/2017 for cellulitis.  Patient now resides at [facility].  He was getting some physical therapy but that has stopped.  He is seated in a wheelchair today.  He has a long history of Parkinson's disease diagnosed in 1996 and the associated challenges associated with the advancing disease.  His Parkinson's disease is primarily right-sided predominant complicated by dyskinesias falls frequent UTIs and overall deconditioning.  Wife reports that she came home from work  one day and he admits to doses of his Stalevo and was extremely tremulous.  He continues to not drink enough water and was encouraged to do so he reports that his appetite is good and he is sleeping well at night.  He is getting  his medications on time.  His wife who is a Publishing rights manager continues to work full-time he returns for reevaluation 11/20/2017 CT of the head without contrast IMPRESSION: Study affected by motion artifact. No acute intracranial abnormality. Atrophy, chronic microvascular disease.   REVIEW OF SYSTEMS: Full 14 system review of systems performed and notable only for those listed, all others are neg:  Constitutional: neg  Cardiovascular: neg Ear/Nose/Throat: neg  Skin: neg Eyes: neg Respiratory: neg Gastroitestinal: Frequent UTIs Hematology/Lymphatic: neg  Endocrine: neg Musculoskeletal: Gait disorder, frequent falls Allergy/Immunology: neg Neurological: Memory loss Psychiatric: neg Sleep : neg   ALLERGIES: No Known Allergies  HOME MEDICATIONS: Outpatient Medications Prior to Visit  Medication Sig Dispense Refill  . carbidopa-levodopa-entacapone (STALEVO) 25-100-200 MG tablet One pill 7 times a day, at 6, 9, 12, 3 PM, 6 PM, 9 PM and 11 PM. (Patient taking differently: Take 1 tablet by mouth See admin instructions. One pill 7 times a day, at 6, 9, 12, 3 PM, 6 PM, 9 PM and 11 PM.) 630 tablet 3  . GUAIFENESIN PO Take 400 mg by mouth 2 (two) times daily.    Marland Kitchen liver oil-zinc oxide (DESITIN) 40 % ointment Apply topically as needed for irritation. 28 g 0  . omeprazole (PRILOSEC) 20 MG capsule Take 20 mg by mouth daily.    . Guaifenesin 1200 MG TB12 Take 1 tablet (1,200 mg total) by mouth 2 (two) times daily. (Patient not taking: Reported on 11/20/2017) 20 each 0   No facility-administered medications prior to visit.     PAST MEDICAL HISTORY: Past Medical History:  Diagnosis Date  . Fall   . Hypothyroidism   . Morbid obesity (HCC) 07/14/2012  . Obesity   . Parkinson disease (HCC)   . UTI (urinary tract infection)   . Venous stasis    edema    PAST SURGICAL HISTORY: Past Surgical History:  Procedure Laterality Date  . anterior cervical discectomy C6-7    . APPENDECTOMY     . CERVICAL LAMINECTOMY    . FOOT SURGERY  1992   right foot repair    FAMILY HISTORY: Family History  Problem Relation Age of Onset  . Heart disease Mother   . Hyperlipidemia Mother   . Colon cancer Father   . Alcohol abuse Other   . Cancer Sister        breast     SOCIAL HISTORY: Social History   Socioeconomic History  . Marital status: Married    Spouse name: [place] S[place]  . Number of children: 3  . Years of education: college  . Highest education level: Not on file  Occupational History    Employer: RETIRED  Social Needs  . Financial resource strain: Not on file  . Food insecurity:    Worry: Not on file    Inability: Not on file  . Transportation needs:    Medical: Not on file    Non-medical: Not on file  Tobacco Use  . Smoking status: Former Smoker    Years: 2.00    Types: Cigarettes    Last attempt to quit: 03/26/1992    Years since quitting: 25.7  . Smokeless tobacco: Never Used  Substance and Sexual Activity  . Alcohol use:  No    Alcohol/week: 0.0 standard drinks  . Drug use: No  . Sexual activity: Not on file  Lifestyle  . Physical activity:    Days per week: Not on file    Minutes per session: Not on file  . Stress: Not on file  Relationships  . Social connections:    Talks on phone: Not on file    Gets together: Not on file    Attends religious service: Not on file    Active member of club or organization: Not on file    Attends meetings of clubs or organizations: Not on file    Relationship status: Not on file  . Intimate partner violence:    Fear of current or ex partner: Not on file    Emotionally abused: Not on file    Physically abused: Not on file    Forced sexual activity: Not on file  Other Topics Concern  . Not on file  Social History Narrative   Regular exercise-No   Retired,is left handed and resides with wife     PHYSICAL EXAM  Vitals:   12/24/17 0835  BP: (!) 89/57  Pulse: 71  Height: 6\' 2"  (1.88 m)   Body mass index is  30.17 kg/m.  Generalized: Well developed, frail elderly male in no acute distress , moderate masking of the face mild  drooling Head: normocephalic and atraumatic,. Oropharynx benign  Neck: Moderately rigid Musculoskeletal: No deformity  Skin trace edema in the ankles Neurological examination   Mentation: Alert  MMSE - Mini Mental State Exam 12/24/2017 10/26/2013  Not completed: (No Data) -  Orientation to time 0 5  Orientation to Place 4 4  Registration 3 3  Attention/ Calculation 3 5  Recall 1 3  Language- name 2 objects 2 2  Language- repeat 1 1  Language- follow 3 step command 2 2  Language- read & follow direction 1 1  Write a sentence 1 1  Copy design 0 0  Total score 18 27    Follows all commands speech is hypophonic with mild to moderate dysarthria.   Cranial nerve II-XII: Pupils were equal round reactive to light extraocular movements with limited upgaze , visual field were full on confrontational test.  Decreased blink.  facial sensation and strength were normal. hearing was intact to finger rubbing bilaterally. Uvula tongue midline. head turning and shoulder shrug were normal and symmetric.Tongue protrusion into cheek strength was normal. Motor: thin bulk  full strength in the BUE, 4/5 BLE, Sensory: normal and symmetric to light touch,   Coordination: finger-nose-finger, no intention tremor no dysmetria Gait and Station: In wheelchair not ambulated due to safety concerns   DIAGNOSTIC DATA (LABS, IMAGING, TESTING) - I reviewed patient records, labs, notes, testing and imaging myself where available.  Lab Results  Component Value Date   WBC 10.8 (H) 11/20/2017   HGB 13.3 11/20/2017   HCT 40.0 11/20/2017   MCV 88.1 11/20/2017   PLT 242 11/20/2017      Component Value Date/Time   NA 140 11/20/2017 1630   K 4.3 11/20/2017 1630   CL 107 11/20/2017 1630   CO2 24 11/20/2017 1630   GLUCOSE 115 (H) 11/20/2017 1630   BUN 22 11/20/2017 1630   CREATININE 0.78 11/20/2017  1630   CALCIUM 8.9 11/20/2017 1630   PROT 6.7 11/20/2017 1630   ALBUMIN 3.5 11/20/2017 1630   AST 19 11/20/2017 1630   ALT 8 11/20/2017 1630   ALKPHOS 68 11/20/2017 1630  BILITOT 1.3 (H) 11/20/2017 1630   GFRNONAA >60 11/20/2017 1630   GFRAA >60 11/20/2017 1630   Lab Results  Component Value Date   CHOL 157 05/15/2009   HDL 44.30 05/15/2009   LDLCALC 96 05/15/2009   TRIG 84.0 05/15/2009   CHOLHDL 4 05/15/2009   No results found for: HGBA1C Lab Results  Component Value Date   VITAMINB12 251 02/05/2010   Lab Results  Component Value Date   TSH 4.24 06/24/2017      ASSESSMENT AND PLAN WYLEY HACK is a very pleasant 77 year old male with a history of advanced Parkinson's disease, right-sided predominant, complicated by obesity, dyskinesias, medication intolerances in the past and progressive difficulty with gait and balance and recurrent falls. He has had overall decline secondary to ageing and having advanced PD. Managing his PD is challenging  he has tried amantadine, Permax, Mirapex, selegiline, and Requip. He has been reasonably stable for the past few years but his condition has progressed invariably and he has developed  dyskinesias, balance issues, recurrent falls, recurrent UTI and increased confusion. He is incontinent of urine.  Last emergency room visit 11/20/2017 for weakness, patient was dehydrated.  He is now at [facility].lexapro generic 5 mg strength was not started after wife read SE. 11/20/2017 CT of the head without contrast IMPRESSION: Study affected by motion artifact. No acute intracranial abnormality. Atrophy, chronic microvascular disease.  PLAN: Continue Stalevo at current dose regimen Patient requires assistance for ambulation at all times Patient has advanced Parkinson's disease and the challenges associated with this. Memory score today 18 out of 30. Reviewed recent ER records. Patient is DNR status Patient will follow-up in 6 months I spent  40 minutes in total face to face time with the patient/wife  more than 50% of which was spent counseling and coordination of care, reviewing test results reviewing medications  Recent ER visits and CT of the headand discussing and reviewing the diagnosis of advanced Parkinson's disease and associated memory loss associated gait disorder  Nilda Riggs, [facility], [facility], APRN  [facility] [address], [address] [place], [place] [address] [phone]  I reviewed the above note and documentation by the Nurse Practitioner and agree with the history, physical exam, assessment and plan as outlined above. I was immediately available for face-to-face consultation. Huston Foley, MD, PhD [facility])

## 2017-12-24 ENCOUNTER — Ambulatory Visit (INDEPENDENT_AMBULATORY_CARE_PROVIDER_SITE_OTHER): Payer: PPO | Admitting: Nurse Practitioner

## 2017-12-24 ENCOUNTER — Encounter: Payer: Self-pay | Admitting: Nurse Practitioner

## 2017-12-24 VITALS — BP 89/57 | HR 71 | Ht 74.0 in

## 2017-12-24 DIAGNOSIS — R413 Other amnesia: Secondary | ICD-10-CM | POA: Diagnosis not present

## 2017-12-24 DIAGNOSIS — R269 Unspecified abnormalities of gait and mobility: Secondary | ICD-10-CM | POA: Diagnosis not present

## 2017-12-24 DIAGNOSIS — W19XXXS Unspecified fall, sequela: Secondary | ICD-10-CM

## 2017-12-24 DIAGNOSIS — G20 Parkinson's disease: Secondary | ICD-10-CM

## 2017-12-24 NOTE — Patient Instructions (Signed)
Per skilled nsg sheet 

## 2017-12-25 DIAGNOSIS — G20 Parkinson's disease: Secondary | ICD-10-CM | POA: Diagnosis not present

## 2017-12-25 DIAGNOSIS — L309 Dermatitis, unspecified: Secondary | ICD-10-CM | POA: Diagnosis not present

## 2017-12-25 DIAGNOSIS — R531 Weakness: Secondary | ICD-10-CM | POA: Diagnosis not present

## 2017-12-25 DIAGNOSIS — R05 Cough: Secondary | ICD-10-CM | POA: Diagnosis not present

## 2018-01-01 DIAGNOSIS — R531 Weakness: Secondary | ICD-10-CM | POA: Diagnosis not present

## 2018-01-01 DIAGNOSIS — G20 Parkinson's disease: Secondary | ICD-10-CM | POA: Diagnosis not present

## 2018-01-01 DIAGNOSIS — L239 Allergic contact dermatitis, unspecified cause: Secondary | ICD-10-CM | POA: Diagnosis not present

## 2018-01-01 DIAGNOSIS — M549 Dorsalgia, unspecified: Secondary | ICD-10-CM | POA: Diagnosis not present

## 2018-01-08 DIAGNOSIS — R3129 Other microscopic hematuria: Secondary | ICD-10-CM | POA: Diagnosis not present

## 2018-01-08 DIAGNOSIS — M549 Dorsalgia, unspecified: Secondary | ICD-10-CM | POA: Diagnosis not present

## 2018-01-08 DIAGNOSIS — G20 Parkinson's disease: Secondary | ICD-10-CM | POA: Diagnosis not present

## 2018-01-28 DIAGNOSIS — R410 Disorientation, unspecified: Secondary | ICD-10-CM | POA: Diagnosis not present

## 2018-02-01 DIAGNOSIS — R4189 Other symptoms and signs involving cognitive functions and awareness: Secondary | ICD-10-CM | POA: Diagnosis not present

## 2018-02-01 DIAGNOSIS — R410 Disorientation, unspecified: Secondary | ICD-10-CM | POA: Diagnosis not present

## 2018-02-01 DIAGNOSIS — R531 Weakness: Secondary | ICD-10-CM | POA: Diagnosis not present

## 2018-02-01 DIAGNOSIS — G20 Parkinson's disease: Secondary | ICD-10-CM | POA: Diagnosis not present

## 2018-02-05 DIAGNOSIS — R05 Cough: Secondary | ICD-10-CM | POA: Diagnosis not present

## 2018-03-08 DIAGNOSIS — L853 Xerosis cutis: Secondary | ICD-10-CM | POA: Diagnosis not present

## 2018-03-09 ENCOUNTER — Encounter: Payer: Self-pay | Admitting: Neurology

## 2018-03-09 ENCOUNTER — Ambulatory Visit (INDEPENDENT_AMBULATORY_CARE_PROVIDER_SITE_OTHER): Payer: PPO | Admitting: Neurology

## 2018-03-09 VITALS — BP 116/68 | HR 73

## 2018-03-09 DIAGNOSIS — R296 Repeated falls: Secondary | ICD-10-CM | POA: Diagnosis not present

## 2018-03-09 DIAGNOSIS — G20 Parkinson's disease: Secondary | ICD-10-CM

## 2018-03-09 DIAGNOSIS — R413 Other amnesia: Secondary | ICD-10-CM | POA: Diagnosis not present

## 2018-03-09 DIAGNOSIS — G20A1 Parkinson's disease without dyskinesia, without mention of fluctuations: Secondary | ICD-10-CM

## 2018-03-09 NOTE — Progress Notes (Signed)
Subjective:    Patient ID: [name] is a 78 y.o. male.  HPI     Interim history:    [name] is a 78 year old left-handed gentleman with an underlying medical history of degenerative disc disease, degenerative neck disease with status post neck surgery, history of syncope in the past, overweight state, reflux disease, and recurrent UTIs, who presents for follow-up consultation of his advanced right-sided predominant Parkinson's disease dating back to 0277, complicated by dyskinesias, recurrent falls, recurrent UTI, prior medication intolerances, and overall decondition and challenges associated with advancing age and advancing Parkinson's disease. The patient is accompanied by his wife again today. He resides at [facility] rehab. I last saw him on 07/07/2015, at which time his wife reported increased fall frequency. He had emergency room visits for falls. He had a head and neck CT without contrast on 06/28/2017 and I reviewed the results: IMPRESSION: CT HEAD:   1. No acute intracranial process.  Multiple small scalp hematomas. 2. Stable moderate parenchymal brain volume loss and moderate chronic small vessel ischemic disease.   CT CERVICAL SPINE:   1. No acute fracture. Grade 1 C3-4 anterolisthesis on degenerative basis. 2. Moderate canal stenosis C3-4, C5-6 and C6-7. Multilevel severe neural foraminal narrowing.   He saw Cecille Rubin, nurse practitioner in the interim on 12/25/2011, at which time his MMSE was 18 out of 30. He was maintained on his medications.  Today, 03/09/2018: He reports doing okay, feeling fairly well. No PT there At this time, is practically wheelchair bound. No significant constipation per wife, no falls reported. His appetite is okay, he is at some weight gain.  The patient's allergies, current medications, family history, past medical history, past social history, past surgical history and problem list were reviewed and updated as appropriate.     Previously (copied from previous notes for reference):    I saw him on 11/20/2016 after a gap of over 1 year, at which time he reported more issues with his balance and more falls. He was mostly confined to wheelchair outside of the home and was using his walker inside the house. They changed from carpet to hardwood floor because it was difficult for him to maneuver the walker over carpet. Nevertheless, he had issues with recurrent cough and recurrent falls at time. We talked about the safety and the importance of fall risk management at the time. I did not suggest increase in his medication regimen at the time.   Today, 07/06/2017 (all dictated new, as well as above notes, some dictation done in note pad or Word, outside of chart, may appear as copied):    He reports Very little today. His wife reports that his fall frequency has increased drastically in the past couple of months. He has fallen despite people around him. He does not always have a recent fall, sometimes he bends over too much. He does not like to drink water, averages about 2 bottles per day per wife's report and his struggling with his urinary incontinence. He is not able to really use a urinal. Of note, he had an emergency room visit on 04/25/2017 after he fell pushing a cart. He had an emergency room visit after fall on 06/28/2017 and presented with generalized weakness and fall the same day. Urinalysis and BMP suggested mild dehydration but he was not given IV fluids, advised to increase oral fluid intake. His urine culture showed no species present and recollection was advised.  I last saw him on 09/17/2015,  at which time he reported more issues with his balance and tendency to fall. He felt worse in the afternoon or early evening. He had more fatigue and weakness. He was trying to go for exercise under a trainer 3 times a week. He recently lost his sister from cancer. He had presented to the emergency room in January after a fall. He  also sustained a fall and injured his right distal leg requiring stitches. He presented to the emergency room in July after a fall. He had a head CT without contrast on 08/24/2015, which I reviewed: IMPRESSION: Mild atrophy with patchy periventricular small vessel disease, stable. No intracranial mass, hemorrhage, or extra-axial fluid collection. No acute infarct evident. There are foci of arterial vascular calcification. There is leftward deviation of the nasal septum. There is probable cerumen in each external auditory canal.   Of note, he missed an appointment on 08/01/2015. I saw him on 12/04/2014, at which time he reported doing reasonably well, no falls since June 2016, was using his walker more consistently, was trying to drink enough water. His wife was working full-time as a Designer, jewellery. He had a male aide come in twice a week for about 6 hours to help him with his laundry, showering and other chores. I suggested he continue with his current medication regimen. He no showed for appointment in February 2017.   I saw him on 05/22/2014, at which time he reported no recent falls. He was using his cane usually after 5 PM when he would become more dyskinetic. He did admit to avoid driving at [place] and [place]. He felt that symptoms were stable. He was trying to go to the gym regularly for exercise. In the interim, he presented to the emergency room and was hospitalized for a day on 08/10/2014 2 08/11/2014 secondary to left leg weakness and recurrent falls and progressive decline. He had workup with a head CT and MRI which I reviewed: CT head without contrast on 08/10/2014 showed:  No acute intracranial pathology. Mild age-related atrophy and chronic microvascular ischemic disease. If symptoms persist and there are no contraindications, MRI may provide better evaluation if clinically indicated.     MRI brain without contrast on 08/10/2014 showed:  1. No acute intracranial abnormality.  Mild for age nonspecific cerebral white matter signal changes. 2. Chronic cervical spinal stenosis, that seen at C3-C4 appears grossly stable since 2011. In addition, I personally reviewed the images through the PACS system. I agree with the findings.   Left hip x-ray on 08/10/2014 was negative. Left hip MRI on 08/10/2014 showed: No acute abnormality.  Negative for fracture.   Small amount of fluid in the left trochanteric bursa compatible with bursitis.   I saw him on 10/26/2013, at which time his wife reported a fall in July. She was supposed to start a new job and was worried about leaving him alone during the day and requested a letter from me on behalf of his Joslyn Greene, [name], to help him transfer through [place] in [place], [name] to [place], [name], so he could stay with them. I kept him on Stalevo. I restarted him on Azilect as an adjunct. Of note, he canceled an appointment with me on 03/07/2014.    I saw him on 01/11/13, at which time I counseled him regarding staying within the prescribed amount of Stalevo. I kept him on Stalevo 6 times a day. I suggested adding Azilect. We talked about his prior diagnosis of obstructive sleep apnea in  2008 and I suggested he be retested for sleep apnea and ordered a sleep study. However, he did not come back for the sleep study, as he did not feel like he wanted to do it.  Of note, he no showed for an appointment on 07/12/2013, as he did not have a ride.     He fell on 09/02/13 and hit his head and went to the ER, where he had [place], which I reviewed: 1. No acute intracranial abnormality. 2. Stable moderate generalized atrophy and mild to moderate chronic microvascular ischemic changes of the white matter. He stopped the Azilect, as he did not feel better.       I first met him on 07/14/2012, at which time I felt he was stable and it did not make any medication changes. He requested a letter to excuse him from jury duty which I provided. In the interim, I was  notified that he was trying to fill his Stalevo prescription earlier than scheduled and perhaps overusing Stalevo. The pharmacy note from [facility] stated that he presented there demanding a refill and threatening to go to the emergency room if he would not get his prescription.     He previously followed with Dr. Morene Antu and was last seen by him on 02/25/2012, at which time Dr. Erling Cruz felt that the patient was doing well. He was exercising regularly.   He was diagnosed with right-sided predominant Parkinson's disease in May 1996 by Dr. Morrell Riddle. He was on selegiline 5 mg in the beginning and in 1999 he was started on amantadine. He discontinued his these medications and started Mirapex but had side effects and discontinued it after a month. He started seeing Dr. Erling Cruz in June 2000 and was re-started on selegiline and amantadine. He was then started on Permax. This was changed and 2005 to Stalevo and Requip was added but discontinued because of excessive daytime somnolence. He has a history of obesity and lost 60 pounds with an exercise program. He has had some memory loss. He had neck surgery in 1996. A sleep study in April 2008 showed hypopneas. In January of this year his MMSE was 29, clock drawing was 4, animal fluency was 13.   He goes to [facility] 3 times a week on M/W/F. He lives with his wife in his own 2 storey home. His bedroom is downstairs. He snores and therefore, sleeps in a separate bedroom. His wife is in school to become a NP. He has fallen in the past.   I reviewed his sleep study from 05/27/06: AHI was 10.1, based on 62 hypopneas and his REM AHI 27.1/h, his O2 nadir was 78% and he spent 17.6 minutes below the saturation of 88%. It was recommended that he come back for CPAP titration but that never came to fruition for one reason or another. He stated that he was told he did not have to be treated with CPAP.   His Past Medical History Is Significant For: Past Medical History:  Diagnosis  Date  . Fall   . Hypothyroidism   . Morbid obesity ([place]) 07/14/2012  . Obesity   . Parkinson disease ([place])   . UTI (urinary tract infection)   . Venous stasis    edema    His Past Surgical History Is Significant For: Past Surgical History:  Procedure Laterality Date  . anterior cervical discectomy C6-7    . APPENDECTOMY    . CERVICAL LAMINECTOMY    . [place]  right foot repair    His Family History Is Significant For: Family History  Problem Relation Age of Onset  . Heart disease Mother   . Hyperlipidemia Mother   . Colon cancer Father   . Alcohol abuse Other   . Cancer Sister        breast     His Social History Is Significant For: Social History   Socioeconomic History  . Marital status: Married    Spouse name: [name]  . Number of children: 3  . Years of education: college  . Highest education level: Not on file  Occupational History    Employer: RETIRED  Social Needs  . Financial resource strain: Not on file  . Food insecurity:    Worry: Not on file    Inability: Not on file  . Transportation needs:    Medical: Not on file    Non-medical: Not on file  Tobacco Use  . Smoking status: Former Smoker    Years: 2.00    Types: Cigarettes    Last attempt to quit: 03/26/1992    Years since quitting: 25.9  . Smokeless tobacco: Never Used  Substance and Sexual Activity  . Alcohol use: No    Alcohol/week: 0.0 standard drinks  . Drug use: No  . Sexual activity: Not on file  Lifestyle  . Physical activity:    Days per week: Not on file    Minutes per session: Not on file  . Stress: Not on file  Relationships  . Social connections:    Talks on phone: Not on file    Gets together: Not on file    Attends religious service: Not on file    Active member of club or organization: Not on file    Attends meetings of clubs or organizations: Not on file    Relationship status: Not on file  Other Topics Concern  . Not on file  Social History Narrative    Regular exercise-No   Retired,is left handed and resides with wife    His Allergies Are:  No Known Allergies:   His Current Medications Are:  Outpatient Encounter Medications as of 03/09/2018  Medication Sig  . carbidopa-levodopa-entacapone (STALEVO) 25-100-200 MG tablet One pill 7 times a day, at 6, 9, 12, 3 PM, 6 PM, 9 PM and 11 PM. (Patient taking differently: Take 1 tablet by mouth See admin instructions. One pill 7 times a day, at 6, 9, 12, 3 PM, 6 PM, 9 PM and 11 PM.)  . Guaifenesin 1200 MG TB12 Take 1 tablet (1,200 mg total) by mouth 2 (two) times daily.  . GUAIFENESIN PO Take 400 mg by mouth 2 (two) times daily.  Marland Kitchen liver oil-zinc oxide (DESITIN) 40 % ointment Apply topically as needed for irritation.  Marland Kitchen omeprazole (PRILOSEC) 20 MG capsule Take 20 mg by mouth daily.   No facility-administered encounter medications on file as of 03/09/2018.   :  Review of Systems:  Out of a complete 14 point review of systems, all are reviewed and negative with the exception of these symptoms as listed below: Review of Systems  Neurological:       Pt presents today to discuss his PD. Pt is living at [facility]. [name] wife denies that pt has had any recent falls.    Objective:  Neurological Exam  Physical Exam Physical Examination:   Vitals:   03/09/18 0939  BP: 116/68  Pulse: 73  SpO2: 93%   General Examination: The patient is a  very pleasant 78 y.o. male in no acute distress. He appears well-developed and well-nourished and adequately groomed. In [place].   HEENT:Normocephalic, atraumatic, pupils are equal, round and reactive to light and accommodation. Extraocular tracking shows moderatesaccadic breakdown without nystagmus noted. There is limitation to upper gaze. There is decrease in eye blink rate. Hearing is grossly intact, he has moderate facial masking, mouth stays open most of the time, no significant sialorrhea. He has minimal head and neck dyskinesias. Neck is moderately to  severely rigid. Airway examination reveals no new findings.  Chest:is clear to auscultation without wheezing, rhonchi or crackles noted.  Heart:sounds are regular and normal without murmurs, rubs or gallops noted.   Abdomen:is soft, non-tender and non-distended with normal bowel sounds appreciated on auscultation.  Extremities:There is no pitting edema in the distal lower extremities bilaterally.  Skin: is warm and dry with no trophic changes noted. Age-related changes are noted on the skin.Older bruises.  Musculoskeletal: exam reveals no obvious joint deformities, tenderness, joint swelling or erythema, mild right knee swelling noted.  Neurologically:   Mental status: The patient is awake and alert, paying goodattention. He is able toto add minimal information to the history, wife provides almost all of his history. His memory, attention, language and knowledge are impaired, he has bradyphrenia. Speech is moderately hypophonic with mild to moderate dysarthria noted. Mood is congruent and affect is normal.Raspy voice, intermittent cough, mucus.  Cranial nerves are as described above under HEENT exam.  Motor exam:thinner bulk, and global strength of about 4 out of 5. He has intermittent generalized dyskinesias, tone is increased in the moderate degree, right more so than left. He has moderate to severe bradykinesia. He has an intermittent resting tremor in the right hand only. Romberg is not testable safely. Reflexes are trace throughout, maybe 1+ in the UEs. Fine motor skills exam: findings are moderate to severely impaired on the right more than left.  Cerebellar testing shows no dysmetria or intention tremor.  Sensory exam is intact to light touch in the upper and lower extremities.   Gait, station and balance:I did not have himstandor walk for me today for safety reasons, in [place].  Assessment and Plan:   In summary, TYHIR SCHWAN is a very pleasant  78 year old male with a history of advanced Parkinson's disease, right-sided predominant, complicated by obesity, dyskinesias, medication intolerances in the past and progressive difficulty with gait and balance and recurrent falls. He has had overall decline secondary to ageing and having advanced PD. Managing his PD has been challenging at times, due to delays in appointments due to no-shows and cancellations. In the past, he has tried amantadine, Permax, Mirapex, selegiline, and Requip. He has been reasonably stable for the pastseveral years but his condition progressed and variably and he developed more dyskinesias, balance issues, had recurrent falls including injuries and recurrent urinary tract infections which led to multiple emergency room visits in the recent past. He is now at [facility] and rehabilitation, we will try to get some physical therapy for range of motion, transfer, and stretching through his facility. I suggested we continue with his current medication regimen. He has adapted well to his new living environment and is doing fairly well. He gets his medications on time, he and his wife decided to forego the referral to Dr. Linus Mako, which I requested last year. I suggested a 47-monthfollow-up, sooner if needed. I answered all their questions today and the patient and his wife were in agreement. I spent  25 minutes in total face-to-face time with the patient, more than 50% of which was spent in counseling and coordination of care, reviewing test results, reviewing medication and discussing or reviewing the diagnosis of PD, its prognosis and treatment options. Pertinent laboratory and imaging test results that were available during this visit with the patient were reviewed by me and considered in my medical decision making (see chart for details).

## 2018-03-09 NOTE — Patient Instructions (Signed)
We will continue with your medications.  We will try PT at [facility].  I will see you in 6 mo.

## 2018-04-01 DIAGNOSIS — R269 Unspecified abnormalities of gait and mobility: Secondary | ICD-10-CM | POA: Diagnosis not present

## 2018-04-01 DIAGNOSIS — G20 Parkinson's disease: Secondary | ICD-10-CM | POA: Diagnosis not present

## 2018-04-01 DIAGNOSIS — R05 Cough: Secondary | ICD-10-CM | POA: Diagnosis not present

## 2018-04-01 DIAGNOSIS — F39 Unspecified mood [affective] disorder: Secondary | ICD-10-CM | POA: Diagnosis not present

## 2018-06-03 DIAGNOSIS — F39 Unspecified mood [affective] disorder: Secondary | ICD-10-CM | POA: Diagnosis not present

## 2018-06-03 DIAGNOSIS — G20 Parkinson's disease: Secondary | ICD-10-CM | POA: Diagnosis not present

## 2018-06-03 DIAGNOSIS — L2389 Allergic contact dermatitis due to other agents: Secondary | ICD-10-CM | POA: Diagnosis not present

## 2018-06-03 DIAGNOSIS — H04123 Dry eye syndrome of bilateral lacrimal glands: Secondary | ICD-10-CM | POA: Diagnosis not present

## 2018-06-03 DIAGNOSIS — K219 Gastro-esophageal reflux disease without esophagitis: Secondary | ICD-10-CM | POA: Diagnosis not present

## 2018-06-03 DIAGNOSIS — R2689 Other abnormalities of gait and mobility: Secondary | ICD-10-CM | POA: Diagnosis not present

## 2018-06-03 DIAGNOSIS — R05 Cough: Secondary | ICD-10-CM | POA: Diagnosis not present

## 2018-06-04 DIAGNOSIS — I10 Essential (primary) hypertension: Secondary | ICD-10-CM | POA: Diagnosis not present

## 2018-06-09 DIAGNOSIS — B349 Viral infection, unspecified: Secondary | ICD-10-CM | POA: Diagnosis not present

## 2018-06-22 DIAGNOSIS — G4709 Other insomnia: Secondary | ICD-10-CM | POA: Diagnosis not present

## 2018-06-22 DIAGNOSIS — U071 COVID-19: Secondary | ICD-10-CM | POA: Diagnosis not present

## 2018-06-24 DIAGNOSIS — R2689 Other abnormalities of gait and mobility: Secondary | ICD-10-CM | POA: Diagnosis not present

## 2018-06-24 DIAGNOSIS — R293 Abnormal posture: Secondary | ICD-10-CM | POA: Diagnosis not present

## 2018-06-24 DIAGNOSIS — G20 Parkinson's disease: Secondary | ICD-10-CM | POA: Diagnosis not present

## 2018-06-24 DIAGNOSIS — R2681 Unsteadiness on feet: Secondary | ICD-10-CM | POA: Diagnosis not present

## 2018-06-24 DIAGNOSIS — R41841 Cognitive communication deficit: Secondary | ICD-10-CM | POA: Diagnosis not present

## 2018-06-24 DIAGNOSIS — R5381 Other malaise: Secondary | ICD-10-CM | POA: Diagnosis not present

## 2018-06-24 DIAGNOSIS — R05 Cough: Secondary | ICD-10-CM | POA: Diagnosis not present

## 2018-06-24 DIAGNOSIS — R278 Other lack of coordination: Secondary | ICD-10-CM | POA: Diagnosis not present

## 2018-06-24 DIAGNOSIS — R1312 Dysphagia, oropharyngeal phase: Secondary | ICD-10-CM | POA: Diagnosis not present

## 2018-06-24 DIAGNOSIS — R498 Other voice and resonance disorders: Secondary | ICD-10-CM | POA: Diagnosis not present

## 2018-06-24 DIAGNOSIS — M6281 Muscle weakness (generalized): Secondary | ICD-10-CM | POA: Diagnosis not present

## 2018-06-24 DIAGNOSIS — U071 COVID-19: Secondary | ICD-10-CM | POA: Diagnosis not present

## 2018-07-06 DIAGNOSIS — U071 COVID-19: Secondary | ICD-10-CM | POA: Diagnosis not present

## 2018-07-06 DIAGNOSIS — R531 Weakness: Secondary | ICD-10-CM | POA: Diagnosis not present

## 2018-07-06 DIAGNOSIS — R410 Disorientation, unspecified: Secondary | ICD-10-CM | POA: Diagnosis not present

## 2018-07-06 DIAGNOSIS — R4189 Other symptoms and signs involving cognitive functions and awareness: Secondary | ICD-10-CM | POA: Diagnosis not present

## 2018-07-08 DIAGNOSIS — Z7189 Other specified counseling: Secondary | ICD-10-CM | POA: Diagnosis not present

## 2018-07-08 DIAGNOSIS — N390 Urinary tract infection, site not specified: Secondary | ICD-10-CM | POA: Diagnosis not present

## 2018-07-08 DIAGNOSIS — R410 Disorientation, unspecified: Secondary | ICD-10-CM | POA: Diagnosis not present

## 2018-07-08 DIAGNOSIS — R05 Cough: Secondary | ICD-10-CM | POA: Diagnosis not present

## 2018-07-08 DIAGNOSIS — R531 Weakness: Secondary | ICD-10-CM | POA: Diagnosis not present

## 2018-07-19 DIAGNOSIS — R2681 Unsteadiness on feet: Secondary | ICD-10-CM | POA: Diagnosis not present

## 2018-07-19 DIAGNOSIS — R2689 Other abnormalities of gait and mobility: Secondary | ICD-10-CM | POA: Diagnosis not present

## 2018-07-19 DIAGNOSIS — R278 Other lack of coordination: Secondary | ICD-10-CM | POA: Diagnosis not present

## 2018-07-19 DIAGNOSIS — R293 Abnormal posture: Secondary | ICD-10-CM | POA: Diagnosis not present

## 2018-07-19 DIAGNOSIS — M6281 Muscle weakness (generalized): Secondary | ICD-10-CM | POA: Diagnosis not present

## 2018-07-19 DIAGNOSIS — G20 Parkinson's disease: Secondary | ICD-10-CM | POA: Diagnosis not present

## 2018-07-19 DIAGNOSIS — R471 Dysarthria and anarthria: Secondary | ICD-10-CM | POA: Diagnosis not present

## 2018-07-19 DIAGNOSIS — R1312 Dysphagia, oropharyngeal phase: Secondary | ICD-10-CM | POA: Diagnosis not present

## 2018-07-19 DIAGNOSIS — U071 COVID-19: Secondary | ICD-10-CM | POA: Diagnosis not present

## 2018-07-19 DIAGNOSIS — R41841 Cognitive communication deficit: Secondary | ICD-10-CM | POA: Diagnosis not present

## 2018-07-21 DIAGNOSIS — Z03818 Encounter for observation for suspected exposure to other biological agents ruled out: Secondary | ICD-10-CM | POA: Diagnosis not present

## 2018-07-21 DIAGNOSIS — R4789 Other speech disturbances: Secondary | ICD-10-CM | POA: Diagnosis not present

## 2018-07-21 DIAGNOSIS — G20 Parkinson's disease: Secondary | ICD-10-CM | POA: Diagnosis not present

## 2018-07-22 DIAGNOSIS — R4189 Other symptoms and signs involving cognitive functions and awareness: Secondary | ICD-10-CM | POA: Diagnosis not present

## 2018-07-22 DIAGNOSIS — R4789 Other speech disturbances: Secondary | ICD-10-CM | POA: Diagnosis not present

## 2018-07-22 DIAGNOSIS — F39 Unspecified mood [affective] disorder: Secondary | ICD-10-CM | POA: Diagnosis not present

## 2018-07-22 DIAGNOSIS — G20 Parkinson's disease: Secondary | ICD-10-CM | POA: Diagnosis not present

## 2018-08-03 DIAGNOSIS — R532 Functional quadriplegia: Secondary | ICD-10-CM | POA: Diagnosis not present

## 2018-08-03 DIAGNOSIS — K219 Gastro-esophageal reflux disease without esophagitis: Secondary | ICD-10-CM | POA: Diagnosis not present

## 2018-08-03 DIAGNOSIS — R05 Cough: Secondary | ICD-10-CM | POA: Diagnosis not present

## 2018-08-03 DIAGNOSIS — G20 Parkinson's disease: Secondary | ICD-10-CM | POA: Diagnosis not present

## 2018-08-03 DIAGNOSIS — N39498 Other specified urinary incontinence: Secondary | ICD-10-CM | POA: Diagnosis not present

## 2018-08-03 DIAGNOSIS — F3489 Other specified persistent mood disorders: Secondary | ICD-10-CM | POA: Diagnosis not present

## 2018-08-03 DIAGNOSIS — Z03818 Encounter for observation for suspected exposure to other biological agents ruled out: Secondary | ICD-10-CM | POA: Diagnosis not present

## 2018-08-09 DIAGNOSIS — R4182 Altered mental status, unspecified: Secondary | ICD-10-CM | POA: Diagnosis not present

## 2018-08-09 DIAGNOSIS — R05 Cough: Secondary | ICD-10-CM | POA: Diagnosis not present

## 2018-08-09 DIAGNOSIS — R4189 Other symptoms and signs involving cognitive functions and awareness: Secondary | ICD-10-CM | POA: Diagnosis not present

## 2018-08-09 DIAGNOSIS — R0989 Other specified symptoms and signs involving the circulatory and respiratory systems: Secondary | ICD-10-CM | POA: Diagnosis not present

## 2018-08-09 DIAGNOSIS — R4789 Other speech disturbances: Secondary | ICD-10-CM | POA: Diagnosis not present

## 2018-08-10 DIAGNOSIS — E871 Hypo-osmolality and hyponatremia: Secondary | ICD-10-CM | POA: Diagnosis not present

## 2018-08-10 DIAGNOSIS — R4789 Other speech disturbances: Secondary | ICD-10-CM | POA: Diagnosis not present

## 2018-08-10 DIAGNOSIS — R4189 Other symptoms and signs involving cognitive functions and awareness: Secondary | ICD-10-CM | POA: Diagnosis not present

## 2018-08-10 DIAGNOSIS — N390 Urinary tract infection, site not specified: Secondary | ICD-10-CM | POA: Diagnosis not present

## 2018-08-12 ENCOUNTER — Telehealth: Payer: Self-pay | Admitting: Neurology

## 2018-08-12 DIAGNOSIS — G20 Parkinson's disease: Secondary | ICD-10-CM | POA: Diagnosis not present

## 2018-08-12 DIAGNOSIS — N390 Urinary tract infection, site not specified: Secondary | ICD-10-CM | POA: Diagnosis not present

## 2018-08-12 NOTE — Telephone Encounter (Signed)
06-25 Doxy.me vvParkinson's  drooling mouth, tremmors becoming more noticable, behavioral changes Jessica @ [place] pt wife to pt gave verbal consent to file insurance for [email or web address] vv Email sent to:[email or web address]  Pt understands that although there may be some limitations with this type of visit, we will take all precautions to reduce any security or privacy concerns.  Pt understands that this will be treated like an in office visit and we will file with pt's insurance, and there may be a patient responsible charge related to this service. *Email sent and received*

## 2018-08-12 NOTE — Telephone Encounter (Signed)
I called pt's wife to update pt's chart. No answer, left a message asking her to call me back.

## 2018-08-16 ENCOUNTER — Ambulatory Visit: Payer: PPO | Admitting: Neurology

## 2018-08-16 ENCOUNTER — Other Ambulatory Visit: Payer: Self-pay

## 2018-08-16 DIAGNOSIS — N390 Urinary tract infection, site not specified: Secondary | ICD-10-CM | POA: Diagnosis not present

## 2018-08-16 DIAGNOSIS — R4189 Other symptoms and signs involving cognitive functions and awareness: Secondary | ICD-10-CM | POA: Diagnosis not present

## 2018-08-16 DIAGNOSIS — U071 COVID-19: Secondary | ICD-10-CM | POA: Diagnosis not present

## 2018-08-16 DIAGNOSIS — G20 Parkinson's disease: Secondary | ICD-10-CM | POA: Diagnosis not present

## 2018-08-16 NOTE — Telephone Encounter (Signed)
Check in staff unable to get in touch with anyone at [facility] place for pt's appt. Pt did not arrive for a virtual visit.

## 2018-08-16 NOTE — Telephone Encounter (Signed)
I called pt's wife, Velva Harman, per DPR. She reports that she has been unable to see pt since March. [name] right hand "rolling motion" is worse. Pt' has a UTI and is confused. Velva Harman does not think his medications have changed while at [facility].

## 2018-08-18 DIAGNOSIS — R278 Other lack of coordination: Secondary | ICD-10-CM | POA: Diagnosis not present

## 2018-08-18 DIAGNOSIS — R41841 Cognitive communication deficit: Secondary | ICD-10-CM | POA: Diagnosis not present

## 2018-08-18 DIAGNOSIS — R293 Abnormal posture: Secondary | ICD-10-CM | POA: Diagnosis not present

## 2018-08-18 DIAGNOSIS — G20 Parkinson's disease: Secondary | ICD-10-CM | POA: Diagnosis not present

## 2018-08-18 DIAGNOSIS — R2689 Other abnormalities of gait and mobility: Secondary | ICD-10-CM | POA: Diagnosis not present

## 2018-08-18 DIAGNOSIS — R1312 Dysphagia, oropharyngeal phase: Secondary | ICD-10-CM | POA: Diagnosis not present

## 2018-08-18 DIAGNOSIS — M6281 Muscle weakness (generalized): Secondary | ICD-10-CM | POA: Diagnosis not present

## 2018-08-18 DIAGNOSIS — R2681 Unsteadiness on feet: Secondary | ICD-10-CM | POA: Diagnosis not present

## 2018-08-18 DIAGNOSIS — R471 Dysarthria and anarthria: Secondary | ICD-10-CM | POA: Diagnosis not present

## 2018-08-23 DIAGNOSIS — Z79899 Other long term (current) drug therapy: Secondary | ICD-10-CM | POA: Diagnosis not present

## 2018-08-23 NOTE — Telephone Encounter (Addendum)
[name] wife [name] ( ok per dpr) called in to apologize for the missed appt. Pt has been rescheduled for 09/07/18 for another virtual video visit. Wife is going to call [facility] and confirm this appt time and means to complete virtual visit. Wife is also asking for no show fee to be waved since [facility] did not connect to the visit and did not answer phone calls.

## 2018-08-25 NOTE — Telephone Encounter (Signed)
Doxy link was sent to [email or web address]

## 2018-09-02 NOTE — Telephone Encounter (Signed)
Pt wife(on DPR) has called, she is asking if RN can call [employer] and speak with the scheduler Janett Billow to r/s the virtual visit for pt.  Wife has given the name of the NP that pt has there as Earmon Phoenix.  The # to [facility] is [phone]

## 2018-09-02 NOTE — Telephone Encounter (Signed)
I called [place], left a message for Janett Billow to call us back. When [facility] calls back, please assist her in rescheduling pt's virtual visit.

## 2018-09-07 ENCOUNTER — Other Ambulatory Visit: Payer: Self-pay

## 2018-09-07 ENCOUNTER — Encounter: Payer: Self-pay | Admitting: Neurology

## 2018-09-07 ENCOUNTER — Ambulatory Visit (INDEPENDENT_AMBULATORY_CARE_PROVIDER_SITE_OTHER): Payer: PPO | Admitting: Neurology

## 2018-09-07 DIAGNOSIS — G20 Parkinson's disease: Secondary | ICD-10-CM

## 2018-09-07 NOTE — Progress Notes (Signed)
Interim history:  [name] is a 78 year old left-handed gentleman with an underlying medical history of degenerative disc disease, degenerative neck disease with status post neck surgery, history of syncope in the past, overweight state, reflux disease, and recurrent UTIs, who presents for a virtual, video based appointment via [email or web address] for follow-up consultation of his advanced Parkinson's disease, right-sided with diagnosis in 6203, complicated by dyskinesias, recurrent falls, recurrent UTI, prior medication intolerances, and overall decondition and challenges associated with advancing age and advancing Parkinsons disease. The patient is accompanied my Solmon Ice, PhD, staff development director at [facility], who is in the background, facilitating this visit and providing additional information. He resides at [facility] rehab and joins via tablet computer, I am located in my office. I last saw him on 03/09/2018, at which time he reported no new issues, no falls reported by his wife, appetite was okay, he was in essence wheelchair-bound at the time.   Today, 09/07/2018: Please also see below for virtual visit documentation   The patient's allergies, current medications, family history, past medical history, past social history, past surgical history and problem list were reviewed and updated as appropriate.    Previously (copied from previous notes for reference):    I saw him on 07/06/2017, at which time his wife reported increased fall frequency. He had emergency room visits for falls. He had a head and neck CT without contrast on 06/28/2017 and I reviewed the results: IMPRESSION: CT HEAD:   1. No acute intracranial process.  Multiple small scalp hematomas. 2. Stable moderate parenchymal brain volume loss and moderate chronic small vessel ischemic disease.   CT CERVICAL SPINE:   1. No acute fracture. Grade 1 C3-4 anterolisthesis on degenerative basis. 2. Moderate canal stenosis C3-4,  C5-6 and C6-7. Multilevel severe neural foraminal narrowing.   He saw Cecille Rubin, nurse practitioner in the interim on 12/24/2017, at which time his MMSE was 18 out of 30. He was maintained on his medications.    I saw him on 11/20/2016 after a gap of over 1 year, at which time he reported more issues with his balance and more falls. He was mostly confined to wheelchair outside of the home and was using his walker inside the house. They changed from carpet to hardwood floor because it was difficult for him to maneuver the walker over carpet. Nevertheless, he had issues with recurrent cough and recurrent falls at time. We talked about the safety and the importance of fall risk management at the time. I did not suggest increase in his medication regimen at the time.   He had an emergency room visit after fall on 06/28/2017 and presented with generalized weakness and fall the same day. Urinalysis and BMP suggested mild dehydration but he was not given IV fluids, advised to increase oral fluid intake. His urine culture showed no species present and recollection was advised.   I saw him on 09/17/2015, at which time he reported more issues with his balance and tendency to fall. He felt worse in the afternoon or early evening. He had more fatigue and weakness. He was trying to go for exercise under a trainer 3 times a week. He recently lost his sister from cancer. He had presented to the emergency room in January after a fall. He also sustained a fall and injured his right distal leg requiring stitches. He presented to the emergency room in July after a fall. He had a head CT without contrast on 08/24/2015, which I  reviewed: IMPRESSION: Mild atrophy with patchy periventricular small vessel disease, stable. No intracranial mass, hemorrhage, or extra-axial fluid collection. No acute infarct evident. There are foci of arterial vascular calcification. There is leftward deviation of the nasal septum. There is  probable cerumen in each external auditory canal.   Of note, he missed an appointment on 08/01/2015. I saw him on 12/04/2014, at which time he reported doing reasonably well, no falls since June 2016, was using his walker more consistently, was trying to drink enough water. His wife was working full-time as a Designer, jewellery. He had a male aide come in twice a week for about 6 hours to help him with his laundry, showering and other chores. I suggested he continue with his current medication regimen. He no showed for appointment in February 2017.   I saw him on 05/22/2014, at which time he reported no recent falls. He was using his cane usually after 5 PM when he would become more dyskinetic. He did admit to avoid driving at [place] and [place]. He felt that symptoms were stable. He was trying to go to the gym regularly for exercise. In the interim, he presented to the emergency room and was hospitalized for a day on 08/10/2014 2 08/11/2014 secondary to left leg weakness and recurrent falls and progressive decline. He had workup with a head CT and MRI which I reviewed: CT head without contrast on 08/10/2014 showed:  No acute intracranial pathology. Mild age-related atrophy and chronic microvascular ischemic disease. If symptoms persist and there are no contraindications, MRI may provide better evaluation if clinically indicated.     MRI brain without contrast on 08/10/2014 showed:  1. No acute intracranial abnormality. Mild for age nonspecific cerebral white matter signal changes. 2. Chronic cervical spinal stenosis, that seen at C3-C4 appears grossly stable since 2011. In addition, I personally reviewed the images through the PACS system. I agree with the findings.   Left hip x-ray on 08/10/2014 was negative. Left hip MRI on 08/10/2014 showed: No acute abnormality.  Negative for fracture.   Small amount of fluid in the left trochanteric bursa compatible with bursitis.   I saw him on  10/26/2013, at which time his wife reported a fall in July. She was supposed to start a new job and was worried about leaving him alone during the day and requested a letter from me on behalf of his [name], [name], to help him transfer through [place] in [place], [name] to [place], [name], so he could stay with them. I kept him on Stalevo. I restarted him on Azilect as an adjunct. Of note, he canceled an appointment with me on 03/07/2014.    I saw him on 01/11/13, at which time I counseled him regarding staying within the prescribed amount of Stalevo. I kept him on Stalevo 6 times a day. I suggested adding Azilect. We talked about his prior diagnosis of obstructive sleep apnea in 2008 and I suggested he be retested for sleep apnea and ordered a sleep study. However, he did not come back for the sleep study, as he did not feel like he wanted to do it.  Of note, he no showed for an appointment on 07/12/2013, as he did not have a ride.     He fell on 09/02/13 and hit his head and went to the ER, where he had Russell, which I reviewed: 1. No acute intracranial abnormality. 2. Stable moderate generalized atrophy and mild to moderate chronic microvascular ischemic changes of  the white matter. He stopped the Azilect, as he did not feel better.       I first met him on 07/14/2012, at which time I felt he was stable and it did not make any medication changes. He requested a letter to excuse him from jury duty which I provided. In the interim, I was notified that he was trying to fill his Stalevo prescription earlier than scheduled and perhaps overusing Stalevo. The pharmacy note from [facility] long pharmacy stated that he presented there demanding a refill and threatening to go to the emergency room if he would not get his prescription.     He previously followed with Dr. Morene Antu and was last seen by him on 02/25/2012, at which time Dr. Erling Cruz felt that the patient was doing well. He was exercising regularly.   He was  diagnosed with right-sided predominant Parkinson's disease in May 1996 by Dr. Morrell Riddle. He was on selegiline 5 mg in the beginning and in 1999 he was started on amantadine. He discontinued his these medications and started Mirapex but had side effects and discontinued it after a month. He started seeing Dr. Erling Cruz in June 2000 and was re-started on selegiline and amantadine. He was then started on Permax. This was changed and 2005 to Stalevo and Requip was added but discontinued because of excessive daytime somnolence. He has a history of obesity and lost 60 pounds with an exercise program. He has had some memory loss. He had neck surgery in 1996. A sleep study in April 2008 showed hypopneas. In January of this year his MMSE was 29, clock drawing was 4, animal fluency was 13.   He goes to [employer] 3 times a week on M/W/F. He lives with his wife in his own 2 storey home. His bedroom is downstairs. He snores and therefore, sleeps in a separate bedroom. His wife is in school to become a NP. He has fallen in the past.   I reviewed his sleep study from 05/27/06: AHI was 10.1, based on 62 hypopneas and his REM AHI 27.1/h, his O2 nadir was 78% and he spent 17.6 minutes below the saturation of 88%. It was recommended that he come back for CPAP titration but that never came to fruition for one reason or another. He stated that he was told he did not have to be treated with CPAP.   His Past Medical History Is Significant For: Past Medical History:  Diagnosis Date   Fall    Hypothyroidism    Morbid obesity ([place]) 07/14/2012   Obesity    Parkinson disease ([place])    UTI (urinary tract infection)    Venous stasis    edema    His Past Surgical History Is Significant For: Past Surgical History:  Procedure Laterality Date   anterior cervical discectomy C6-7     APPENDECTOMY     CERVICAL LAMINECTOMY     FOOT SURGERY  1992   right foot repair    His Family History Is Significant For: Family History  Problem  Relation Age of Onset   Heart disease Mother    Hyperlipidemia Mother    Colon cancer Father    Alcohol abuse Other    Cancer Sister        breast     His Social History Is Significant For: Social History   Socioeconomic History   Marital status: Married    Spouse name: Velva Harman   Number of children: 3   Years of education: college  Highest education level: Not on file  Occupational History    Employer: RETIRED  Social Designer, fashion/clothing strain: Not on file   Food insecurity    Worry: Not on file    Inability: Not on file   Transportation needs    Medical: Not on file    Non-medical: Not on file  Tobacco Use   Smoking status: Former Smoker    Years: 2.00    Types: Cigarettes    Quit date: 03/26/1992    Years since quitting: 26.4   Smokeless tobacco: Never Used  Substance and Sexual Activity   Alcohol use: No    Alcohol/week: 0.0 standard drinks   Drug use: No   Sexual activity: Not on file  Lifestyle   Physical activity    Days per week: Not on file    Minutes per session: Not on file   Stress: Not on file  Relationships   Social connections    Talks on phone: Not on file    Gets together: Not on file    Attends religious service: Not on file    Active member of club or organization: Not on file    Attends meetings of clubs or organizations: Not on file    Relationship status: Not on file  Other Topics Concern   Not on file  Social History Narrative   Regular exercise-No   Retired,is left handed and resides with wife    His Allergies Are:  No Known Allergies:   His Current Medications Are:  Outpatient Encounter Medications as of 09/07/2018  Medication Sig   carbidopa-levodopa-entacapone (STALEVO) 25-100-200 MG tablet One pill 7 times a day, at 6, 9, 12, 3 PM, 6 PM, 9 PM and 11 PM. (Patient taking differently: Take 1 tablet by mouth See admin instructions. One pill 7 times a day, at 6, 9, 12, 3 PM, 6 PM, 9 PM and 11 PM.)    Guaifenesin 1200 MG TB12 Take 1 tablet (1,200 mg total) by mouth 2 (two) times daily.   GUAIFENESIN PO Take 400 mg by mouth 2 (two) times daily.   liver oil-zinc oxide (DESITIN) 40 % ointment Apply topically as needed for irritation.   omeprazole (PRILOSEC) 20 MG capsule Take 20 mg by mouth daily.   No facility-administered encounter medications on file as of 09/07/2018.   :  Review of Systems:  Out of a complete 14 point review of systems, all are reviewed and negative with the exception of these symptoms as listed below:  Virtual Visit via Video Note on @ TODAY@  I connected with@ on 09/07/18 at  9:30 AM EDT by a video enabled telemedicine application and verified that I am speaking with the correct person using two identifiers.   I discussed the limitations of evaluation and management by telemedicine and the availability of in person appointments. The patient expressed understanding and agreed to proceed.  History of Present Illness: He reports doing okay, stable, denies any falls, denies significant constipation, feels like he is sleeping okay, no problems swallowing, no issues choking on liquids.  Dr. Starleen Blue verified that he is on generic Stalevo 7 times a day starting at 6 AM every 3 hours with the last dose at 11 PM.     Observations/Objective: On examination, he is situated in his bed with head of bed elevated, he is propped up with pillows.  His comprehension is good, speech is moderately hypophonic and moderately dysarthric, face is symmetric with moderate facial masking, mouth  stays open most of the time.  Some sialorrhea noted.  Tracking is difficult to assess.  Motor exam reveals no obvious dyskinesias, globally thinner Bulk, strength difficult to assess, no obvious resting tremor. Fine motor skills in the upper extremities with hand movements and finger taps are moderate to severely impaired.  Assessment and Plan: In summary, SPIKE DESILETS is a 78 year old male with an  underlying medical history of degenerative disc disease, degenerative neck disease with status post neck surgery, history of syncope in the past, overweight state, reflux disease, and recurrent UTIs, who presents for a virtual, video based appointment via [email or web address] For follow-up consultation of his advanced Parkinson's disease, right-sided predominant, complicated by obesity, dyskinesias, medication intolerances in the past and progressive difficulty with gait and balance and recurrent falls. He hashad overall decline secondary to ageing and having advanced PD. Managing his PD has been challenging at times, due to delays in appointments due to no-shows and cancellations. In the past, he hastried amantadine, Permax, Mirapex, selegiline, and Requip. Hehas been reasonably stablefor the past several years but his condition progressed invariably and he developed more dyskinesias, balance issues, had recurrent falls including injuries and recurrent urinary tract infections which led to multiple emergency room visits in the recent past. He is now at [facility] and rehabilitation, we will try to get some physical therapy for range of motion, transfer, and stretching through his facility. Dr. Starleen Blue indicated that there was a problem with the insurance for pursuing this, perhaps we can try later this year or beginning of next year again, she will check with physical therapy directly. I suggested we continue with his current medication regimen. He has adapted well to his new living environment and is doing fairly well. He gets his medications on time. He and his wife decided to forego the referral to Dr. Linus Mako, which I requested last year. I suggested a 72-monthfollow-up, sooner if needed. I answered all their questions today and the patient and Dr. PStarleen Bluewere in agreement.  Follow Up Instructions:    I discussed the assessment and treatment plan with the patient. The patient was provided an opportunity to ask  questions and all were answered. The patient agreed with the plan and demonstrated an understanding of the instructions.   The patient was advised to call back or seek an in-person evaluation if the symptoms worsen or if the condition fails to improve as anticipated.  I provided 15 minutes of non-face-to-face time during this encounter.   SStar Age MD

## 2018-09-07 NOTE — Patient Instructions (Signed)
Given verbally, during today's virtual video-based encounter, with verbal feedback received.   

## 2018-09-18 DIAGNOSIS — M6281 Muscle weakness (generalized): Secondary | ICD-10-CM | POA: Diagnosis not present

## 2018-09-18 DIAGNOSIS — R2689 Other abnormalities of gait and mobility: Secondary | ICD-10-CM | POA: Diagnosis not present

## 2018-09-18 DIAGNOSIS — R293 Abnormal posture: Secondary | ICD-10-CM | POA: Diagnosis not present

## 2018-09-18 DIAGNOSIS — G20 Parkinson's disease: Secondary | ICD-10-CM | POA: Diagnosis not present

## 2018-09-18 DIAGNOSIS — R278 Other lack of coordination: Secondary | ICD-10-CM | POA: Diagnosis not present

## 2018-09-18 DIAGNOSIS — R1312 Dysphagia, oropharyngeal phase: Secondary | ICD-10-CM | POA: Diagnosis not present

## 2018-09-18 DIAGNOSIS — R471 Dysarthria and anarthria: Secondary | ICD-10-CM | POA: Diagnosis not present

## 2018-09-18 DIAGNOSIS — R2681 Unsteadiness on feet: Secondary | ICD-10-CM | POA: Diagnosis not present

## 2018-09-18 DIAGNOSIS — R41841 Cognitive communication deficit: Secondary | ICD-10-CM | POA: Diagnosis not present

## 2018-09-30 DIAGNOSIS — K219 Gastro-esophageal reflux disease without esophagitis: Secondary | ICD-10-CM | POA: Diagnosis not present

## 2018-09-30 DIAGNOSIS — F39 Unspecified mood [affective] disorder: Secondary | ICD-10-CM | POA: Diagnosis not present

## 2018-09-30 DIAGNOSIS — G20 Parkinson's disease: Secondary | ICD-10-CM | POA: Diagnosis not present

## 2018-09-30 DIAGNOSIS — R3981 Functional urinary incontinence: Secondary | ICD-10-CM | POA: Diagnosis not present

## 2018-10-21 DIAGNOSIS — R1312 Dysphagia, oropharyngeal phase: Secondary | ICD-10-CM | POA: Diagnosis not present

## 2018-10-21 DIAGNOSIS — R471 Dysarthria and anarthria: Secondary | ICD-10-CM | POA: Diagnosis not present

## 2018-10-21 DIAGNOSIS — R278 Other lack of coordination: Secondary | ICD-10-CM | POA: Diagnosis not present

## 2018-10-21 DIAGNOSIS — G20 Parkinson's disease: Secondary | ICD-10-CM | POA: Diagnosis not present

## 2018-10-21 DIAGNOSIS — R41841 Cognitive communication deficit: Secondary | ICD-10-CM | POA: Diagnosis not present

## 2018-10-21 DIAGNOSIS — R2689 Other abnormalities of gait and mobility: Secondary | ICD-10-CM | POA: Diagnosis not present

## 2018-10-21 DIAGNOSIS — R2681 Unsteadiness on feet: Secondary | ICD-10-CM | POA: Diagnosis not present

## 2018-10-21 DIAGNOSIS — R293 Abnormal posture: Secondary | ICD-10-CM | POA: Diagnosis not present

## 2018-10-21 DIAGNOSIS — M6281 Muscle weakness (generalized): Secondary | ICD-10-CM | POA: Diagnosis not present

## 2018-11-15 IMAGING — DX DG ELBOW COMPLETE 3+V*R*
4 series · 4 of 4 positions shown · non-contrast
Comparison: None.

CLINICAL DATA: Recent fall with elbow pain and swelling, initial
encounter

EXAM:
RIGHT ELBOW - COMPLETE 3+ VIEW

[elbow ap]
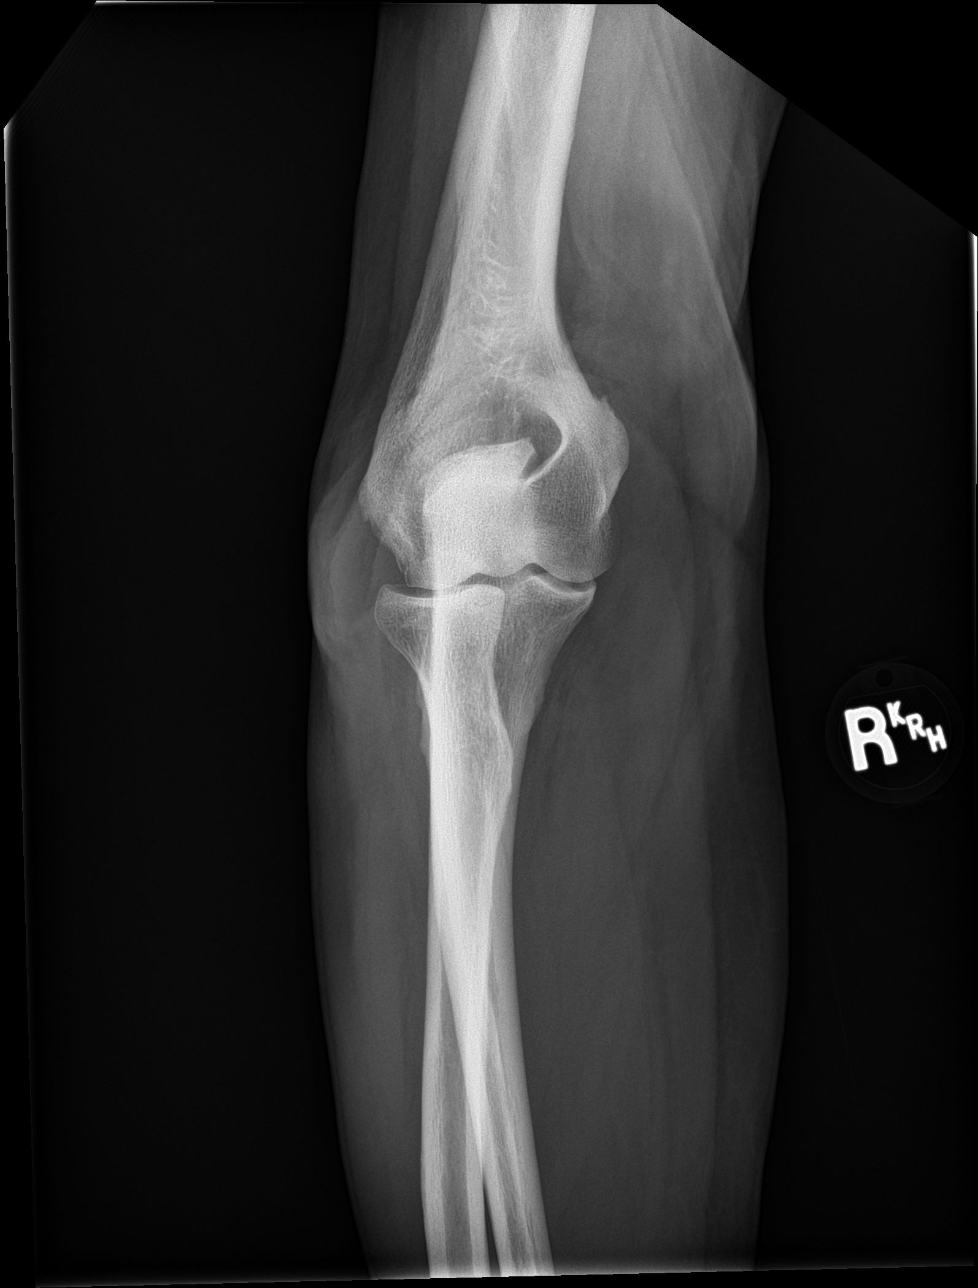

[elbow obl (1 of 2)]
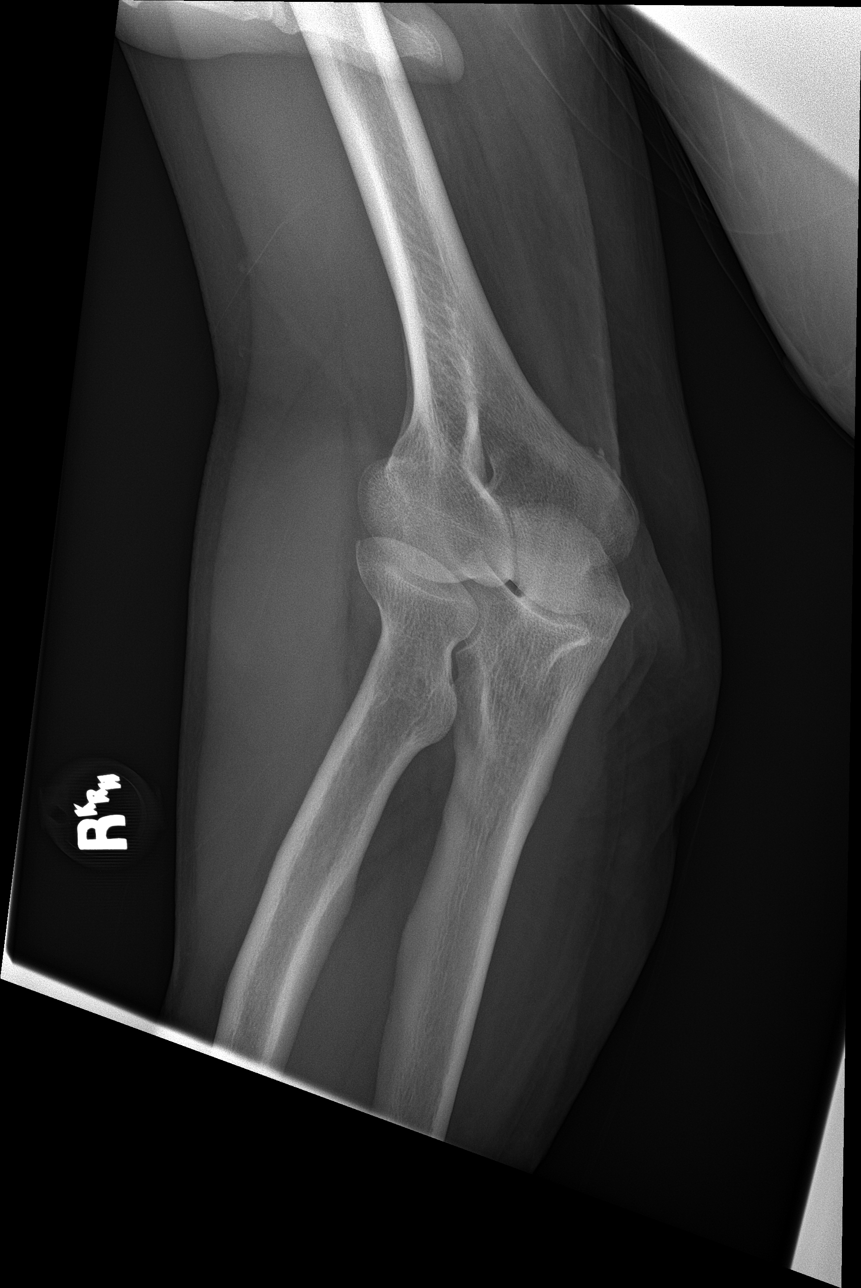

[elbow obl (2 of 2)]
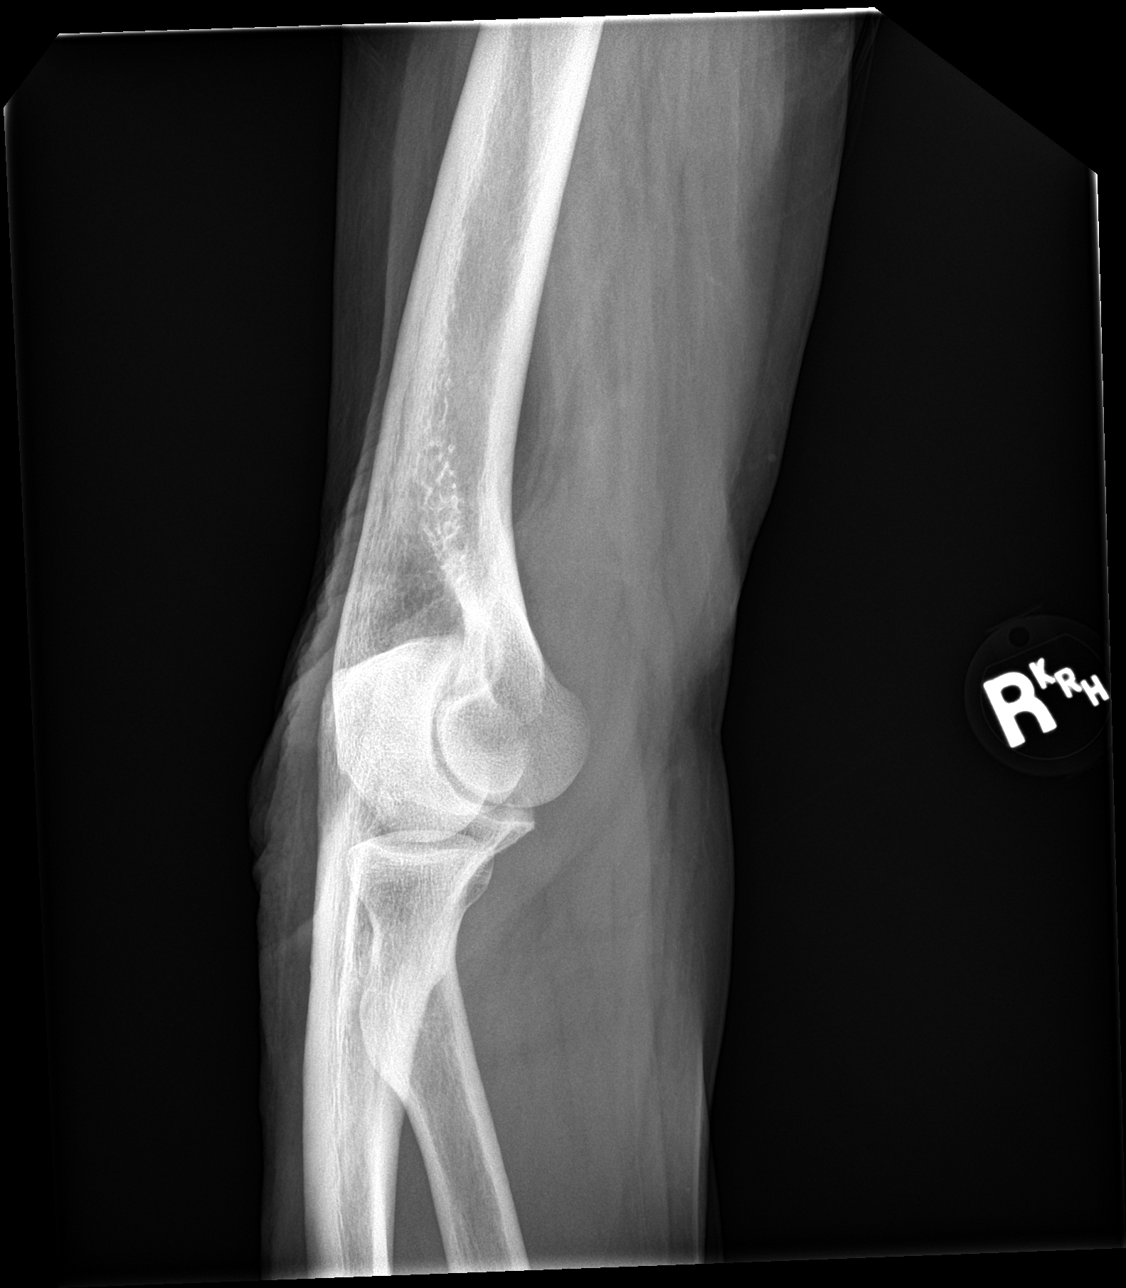

[elbow lat]
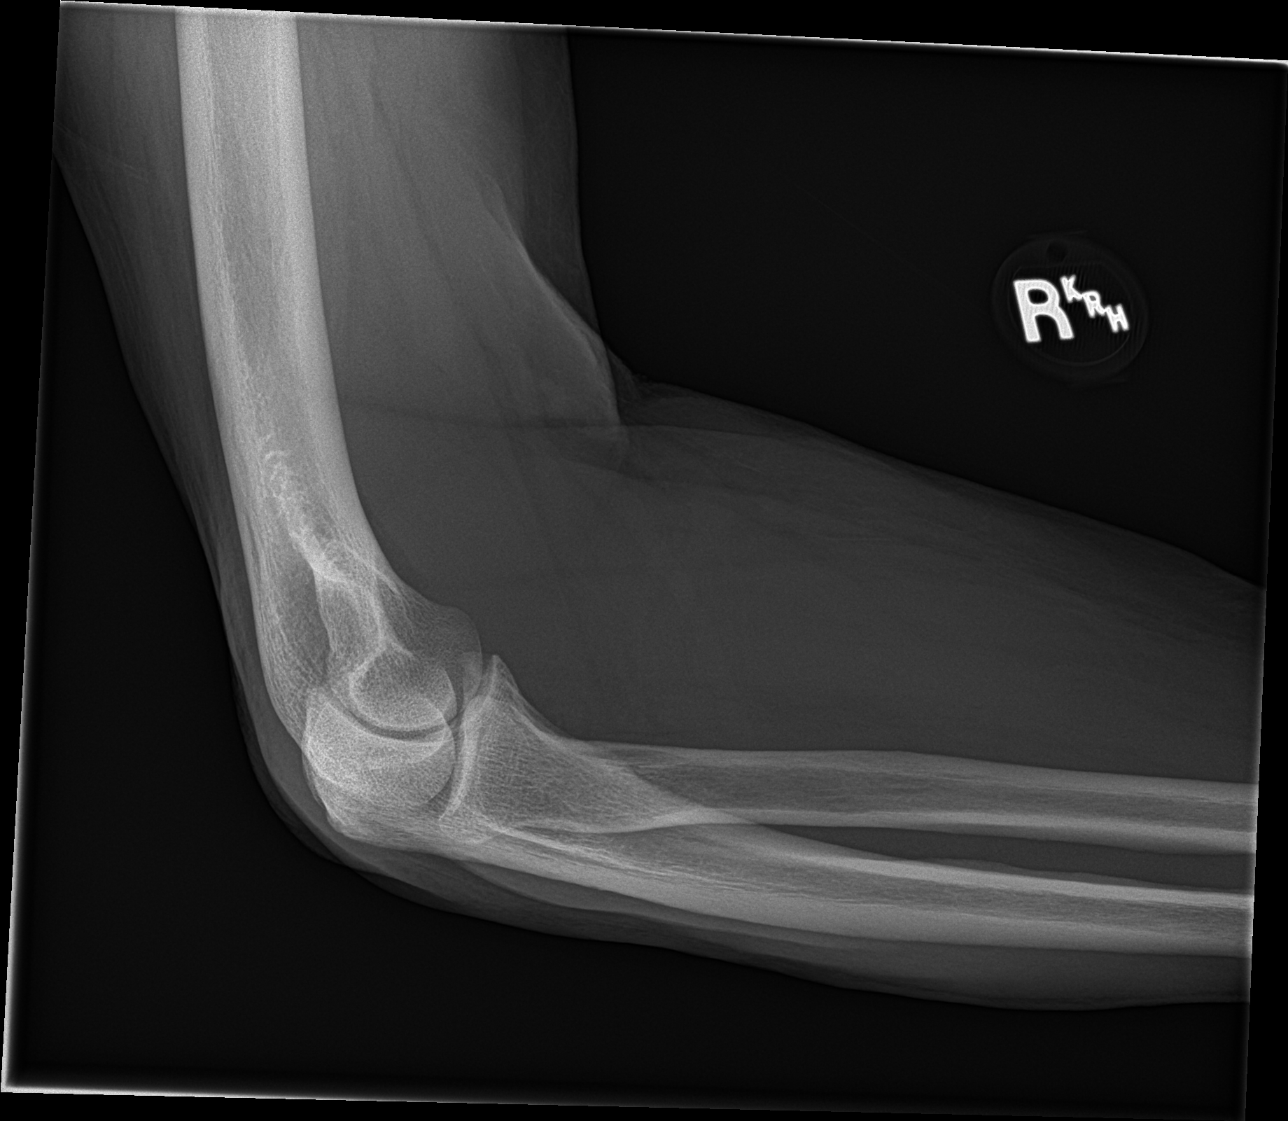

[4 of 4 positions shown; findings below may reference images not displayed]

FINDINGS: There is no evidence of fracture, dislocation, or joint effusion.
There is no evidence of arthropathy or other focal bone abnormality.
Soft tissues are unremarkable.
IMPRESSION: No acute abnormality noted.

## 2018-11-16 DIAGNOSIS — K219 Gastro-esophageal reflux disease without esophagitis: Secondary | ICD-10-CM | POA: Diagnosis not present

## 2018-11-16 DIAGNOSIS — R1319 Other dysphagia: Secondary | ICD-10-CM | POA: Diagnosis not present

## 2018-11-16 DIAGNOSIS — R05 Cough: Secondary | ICD-10-CM | POA: Diagnosis not present

## 2018-11-16 DIAGNOSIS — J811 Chronic pulmonary edema: Secondary | ICD-10-CM | POA: Diagnosis not present

## 2018-11-16 DIAGNOSIS — Z20828 Contact with and (suspected) exposure to other viral communicable diseases: Secondary | ICD-10-CM | POA: Diagnosis not present

## 2018-11-19 DIAGNOSIS — R0689 Other abnormalities of breathing: Secondary | ICD-10-CM | POA: Diagnosis not present

## 2018-11-19 DIAGNOSIS — G20 Parkinson's disease: Secondary | ICD-10-CM | POA: Diagnosis not present

## 2018-11-19 DIAGNOSIS — R1319 Other dysphagia: Secondary | ICD-10-CM | POA: Diagnosis not present

## 2018-11-19 DIAGNOSIS — R05 Cough: Secondary | ICD-10-CM | POA: Diagnosis not present

## 2018-11-22 DIAGNOSIS — M6281 Muscle weakness (generalized): Secondary | ICD-10-CM | POA: Diagnosis not present

## 2018-11-22 DIAGNOSIS — R41841 Cognitive communication deficit: Secondary | ICD-10-CM | POA: Diagnosis not present

## 2018-11-22 DIAGNOSIS — G20 Parkinson's disease: Secondary | ICD-10-CM | POA: Diagnosis not present

## 2018-11-22 DIAGNOSIS — R293 Abnormal posture: Secondary | ICD-10-CM | POA: Diagnosis not present

## 2018-11-22 DIAGNOSIS — R278 Other lack of coordination: Secondary | ICD-10-CM | POA: Diagnosis not present

## 2018-11-22 DIAGNOSIS — R471 Dysarthria and anarthria: Secondary | ICD-10-CM | POA: Diagnosis not present

## 2018-11-22 DIAGNOSIS — R2681 Unsteadiness on feet: Secondary | ICD-10-CM | POA: Diagnosis not present

## 2018-11-22 DIAGNOSIS — R1312 Dysphagia, oropharyngeal phase: Secondary | ICD-10-CM | POA: Diagnosis not present

## 2018-11-22 DIAGNOSIS — R2689 Other abnormalities of gait and mobility: Secondary | ICD-10-CM | POA: Diagnosis not present

## 2018-11-22 DIAGNOSIS — J189 Pneumonia, unspecified organism: Secondary | ICD-10-CM | POA: Diagnosis not present

## 2018-11-23 DIAGNOSIS — K219 Gastro-esophageal reflux disease without esophagitis: Secondary | ICD-10-CM | POA: Diagnosis not present

## 2018-11-23 DIAGNOSIS — G20 Parkinson's disease: Secondary | ICD-10-CM | POA: Diagnosis not present

## 2018-11-23 DIAGNOSIS — R0689 Other abnormalities of breathing: Secondary | ICD-10-CM | POA: Diagnosis not present

## 2018-11-23 DIAGNOSIS — U071 COVID-19: Secondary | ICD-10-CM | POA: Diagnosis not present

## 2018-11-23 DIAGNOSIS — R05 Cough: Secondary | ICD-10-CM | POA: Diagnosis not present

## 2018-11-23 DIAGNOSIS — R1319 Other dysphagia: Secondary | ICD-10-CM | POA: Diagnosis not present

## 2018-11-26 DIAGNOSIS — R0689 Other abnormalities of breathing: Secondary | ICD-10-CM | POA: Diagnosis not present

## 2018-11-26 DIAGNOSIS — R05 Cough: Secondary | ICD-10-CM | POA: Diagnosis not present

## 2018-11-26 DIAGNOSIS — K219 Gastro-esophageal reflux disease without esophagitis: Secondary | ICD-10-CM | POA: Diagnosis not present

## 2018-11-30 DIAGNOSIS — R05 Cough: Secondary | ICD-10-CM | POA: Diagnosis not present

## 2018-11-30 DIAGNOSIS — F39 Unspecified mood [affective] disorder: Secondary | ICD-10-CM | POA: Diagnosis not present

## 2018-11-30 DIAGNOSIS — U071 COVID-19: Secondary | ICD-10-CM | POA: Diagnosis not present

## 2018-11-30 DIAGNOSIS — G20 Parkinson's disease: Secondary | ICD-10-CM | POA: Diagnosis not present

## 2018-11-30 DIAGNOSIS — K219 Gastro-esophageal reflux disease without esophagitis: Secondary | ICD-10-CM | POA: Diagnosis not present

## 2018-12-08 DIAGNOSIS — U071 COVID-19: Secondary | ICD-10-CM | POA: Diagnosis not present

## 2018-12-14 DIAGNOSIS — U071 COVID-19: Secondary | ICD-10-CM | POA: Diagnosis not present

## 2018-12-20 DIAGNOSIS — R2681 Unsteadiness on feet: Secondary | ICD-10-CM | POA: Diagnosis not present

## 2018-12-20 DIAGNOSIS — M6281 Muscle weakness (generalized): Secondary | ICD-10-CM | POA: Diagnosis not present

## 2018-12-20 DIAGNOSIS — G20 Parkinson's disease: Secondary | ICD-10-CM | POA: Diagnosis not present

## 2018-12-20 DIAGNOSIS — R1312 Dysphagia, oropharyngeal phase: Secondary | ICD-10-CM | POA: Diagnosis not present

## 2018-12-20 DIAGNOSIS — R293 Abnormal posture: Secondary | ICD-10-CM | POA: Diagnosis not present

## 2018-12-20 DIAGNOSIS — R2689 Other abnormalities of gait and mobility: Secondary | ICD-10-CM | POA: Diagnosis not present

## 2018-12-20 DIAGNOSIS — R278 Other lack of coordination: Secondary | ICD-10-CM | POA: Diagnosis not present

## 2018-12-20 DIAGNOSIS — R471 Dysarthria and anarthria: Secondary | ICD-10-CM | POA: Diagnosis not present

## 2018-12-20 DIAGNOSIS — R41841 Cognitive communication deficit: Secondary | ICD-10-CM | POA: Diagnosis not present

## 2018-12-21 DIAGNOSIS — U071 COVID-19: Secondary | ICD-10-CM | POA: Diagnosis not present

## 2018-12-28 DIAGNOSIS — U071 COVID-19: Secondary | ICD-10-CM | POA: Diagnosis not present

## 2019-01-03 DIAGNOSIS — U071 COVID-19: Secondary | ICD-10-CM | POA: Diagnosis not present

## 2019-01-10 DIAGNOSIS — U071 COVID-19: Secondary | ICD-10-CM | POA: Diagnosis not present

## 2019-01-18 DIAGNOSIS — U071 COVID-19: Secondary | ICD-10-CM | POA: Diagnosis not present

## 2019-01-25 DIAGNOSIS — U071 COVID-19: Secondary | ICD-10-CM | POA: Diagnosis not present

## 2019-02-01 DIAGNOSIS — R532 Functional quadriplegia: Secondary | ICD-10-CM | POA: Diagnosis not present

## 2019-02-01 DIAGNOSIS — K219 Gastro-esophageal reflux disease without esophagitis: Secondary | ICD-10-CM | POA: Diagnosis not present

## 2019-02-01 DIAGNOSIS — F39 Unspecified mood [affective] disorder: Secondary | ICD-10-CM | POA: Diagnosis not present

## 2019-02-01 DIAGNOSIS — R1319 Other dysphagia: Secondary | ICD-10-CM | POA: Diagnosis not present

## 2019-02-01 DIAGNOSIS — U071 COVID-19: Secondary | ICD-10-CM | POA: Diagnosis not present

## 2019-02-01 DIAGNOSIS — E871 Hypo-osmolality and hyponatremia: Secondary | ICD-10-CM | POA: Diagnosis not present

## 2019-02-01 DIAGNOSIS — G20 Parkinson's disease: Secondary | ICD-10-CM | POA: Diagnosis not present

## 2019-02-04 DIAGNOSIS — Z79899 Other long term (current) drug therapy: Secondary | ICD-10-CM | POA: Diagnosis not present

## 2019-02-08 DIAGNOSIS — U071 COVID-19: Secondary | ICD-10-CM | POA: Diagnosis not present

## 2019-02-13 DIAGNOSIS — R278 Other lack of coordination: Secondary | ICD-10-CM | POA: Diagnosis not present

## 2019-02-13 DIAGNOSIS — Z9181 History of falling: Secondary | ICD-10-CM | POA: Diagnosis not present

## 2019-02-13 DIAGNOSIS — G20 Parkinson's disease: Secondary | ICD-10-CM | POA: Diagnosis not present

## 2019-02-13 DIAGNOSIS — R2681 Unsteadiness on feet: Secondary | ICD-10-CM | POA: Diagnosis not present

## 2019-02-13 DIAGNOSIS — R2689 Other abnormalities of gait and mobility: Secondary | ICD-10-CM | POA: Diagnosis not present

## 2019-02-13 DIAGNOSIS — R41841 Cognitive communication deficit: Secondary | ICD-10-CM | POA: Diagnosis not present

## 2019-02-13 DIAGNOSIS — R471 Dysarthria and anarthria: Secondary | ICD-10-CM | POA: Diagnosis not present

## 2019-02-13 DIAGNOSIS — M6281 Muscle weakness (generalized): Secondary | ICD-10-CM | POA: Diagnosis not present

## 2019-02-13 DIAGNOSIS — R1312 Dysphagia, oropharyngeal phase: Secondary | ICD-10-CM | POA: Diagnosis not present

## 2019-02-13 DIAGNOSIS — R293 Abnormal posture: Secondary | ICD-10-CM | POA: Diagnosis not present

## 2019-02-15 DIAGNOSIS — U071 COVID-19: Secondary | ICD-10-CM | POA: Diagnosis not present

## 2019-02-19 DIAGNOSIS — R293 Abnormal posture: Secondary | ICD-10-CM | POA: Diagnosis not present

## 2019-02-19 DIAGNOSIS — M6281 Muscle weakness (generalized): Secondary | ICD-10-CM | POA: Diagnosis not present

## 2019-02-19 DIAGNOSIS — R471 Dysarthria and anarthria: Secondary | ICD-10-CM | POA: Diagnosis not present

## 2019-02-19 DIAGNOSIS — Z9181 History of falling: Secondary | ICD-10-CM | POA: Diagnosis not present

## 2019-02-19 DIAGNOSIS — R41841 Cognitive communication deficit: Secondary | ICD-10-CM | POA: Diagnosis not present

## 2019-02-19 DIAGNOSIS — R2689 Other abnormalities of gait and mobility: Secondary | ICD-10-CM | POA: Diagnosis not present

## 2019-02-19 DIAGNOSIS — R278 Other lack of coordination: Secondary | ICD-10-CM | POA: Diagnosis not present

## 2019-02-19 DIAGNOSIS — R2681 Unsteadiness on feet: Secondary | ICD-10-CM | POA: Diagnosis not present

## 2019-02-19 DIAGNOSIS — G20 Parkinson's disease: Secondary | ICD-10-CM | POA: Diagnosis not present

## 2019-02-19 DIAGNOSIS — R1312 Dysphagia, oropharyngeal phase: Secondary | ICD-10-CM | POA: Diagnosis not present

## 2019-02-21 DIAGNOSIS — U071 COVID-19: Secondary | ICD-10-CM | POA: Diagnosis not present

## 2019-02-23 DIAGNOSIS — U071 COVID-19: Secondary | ICD-10-CM | POA: Diagnosis not present

## 2019-03-02 DIAGNOSIS — U071 COVID-19: Secondary | ICD-10-CM | POA: Diagnosis not present

## 2019-03-05 IMAGING — US US RENAL
1 series · 14 of 25 positions shown · non-contrast
Comparison: None.

CLINICAL DATA: Hematuria.

EXAM:
RENAL / URINARY TRACT ULTRASOUND COMPLETE

[Series 1: us renal · 14 of 55 slices shown]
[im 1/55]
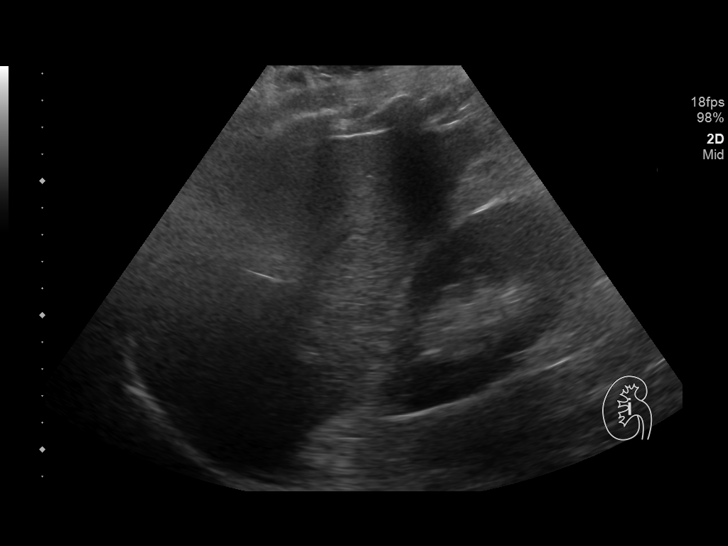
[im 5/55]
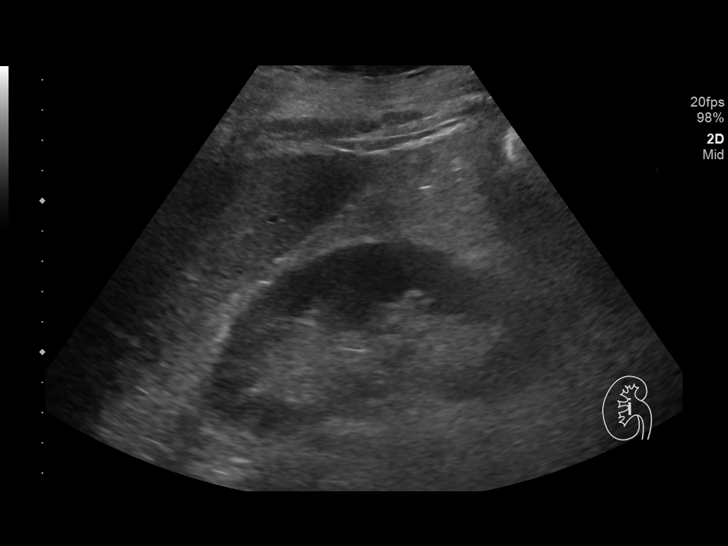
[im 10/55]
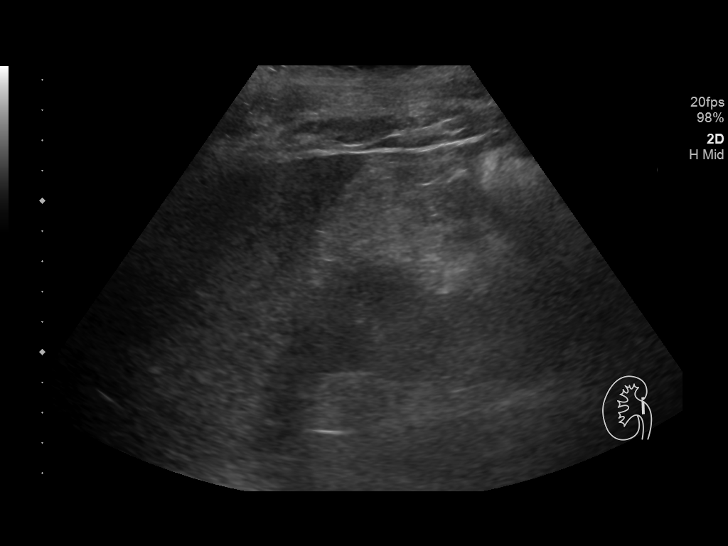
[im 14/55]
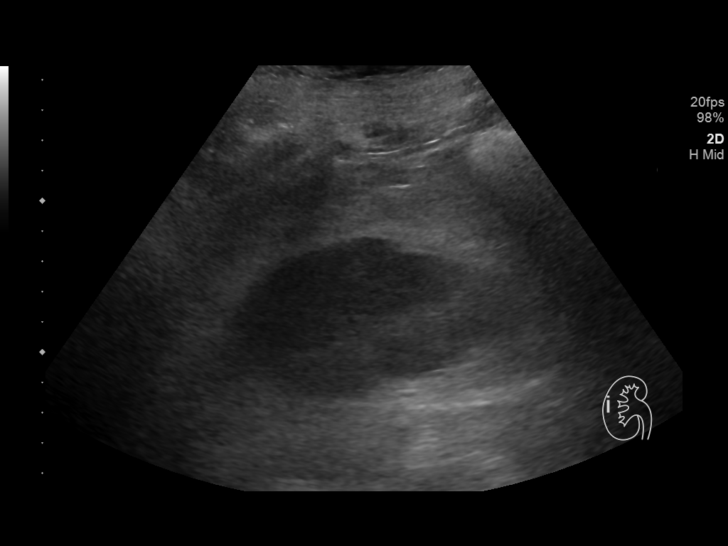
[im 19/55]
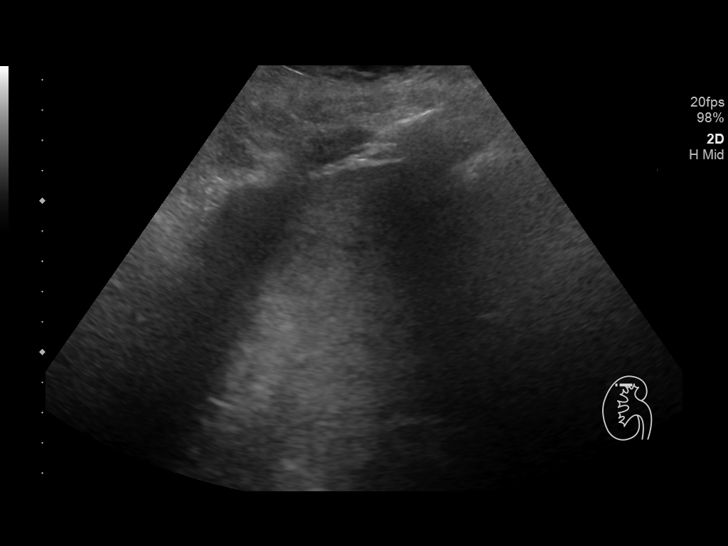
[im 21/55]
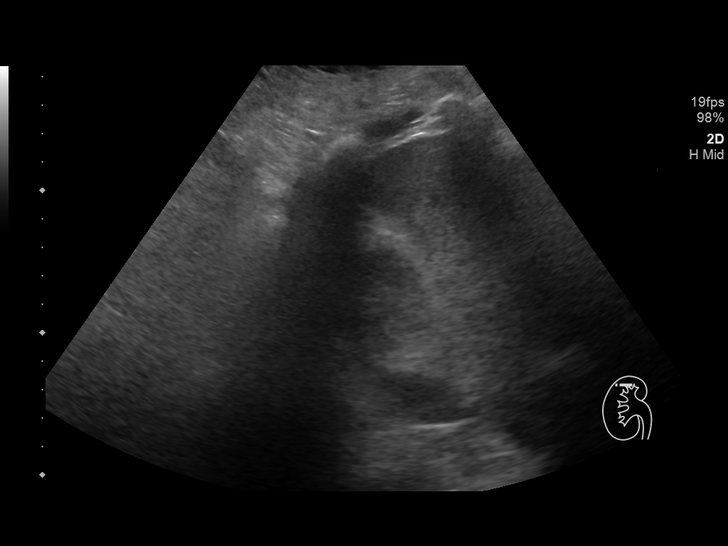
[im 25/55]
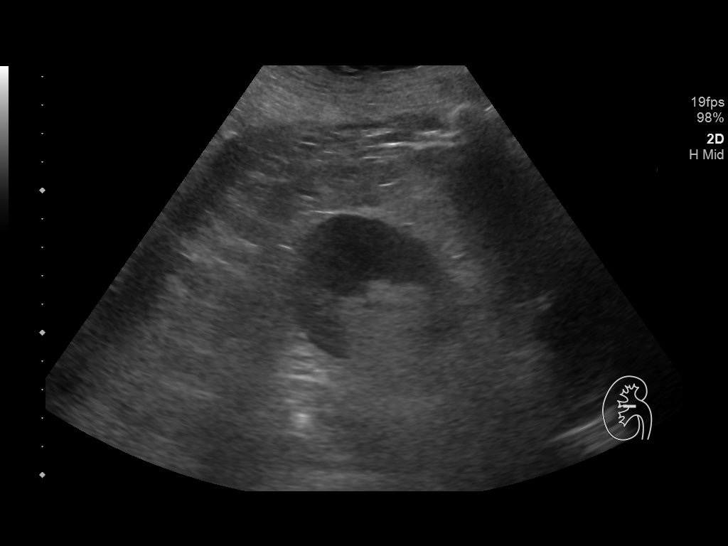
[im 30/55]
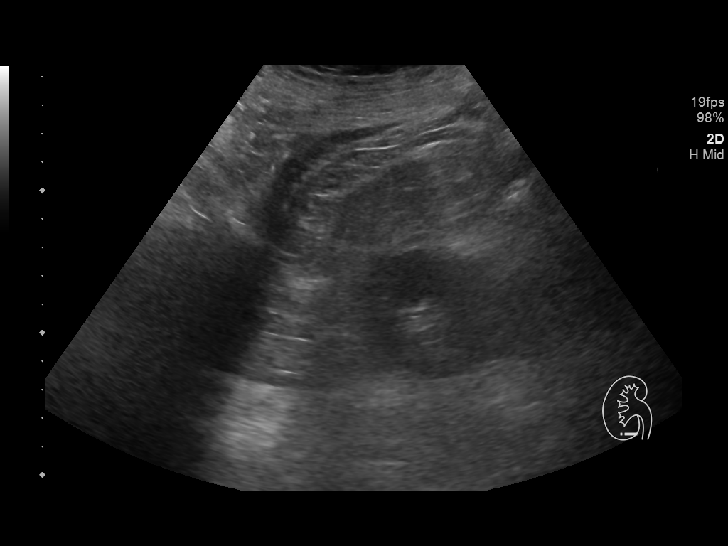
[im 34/55]
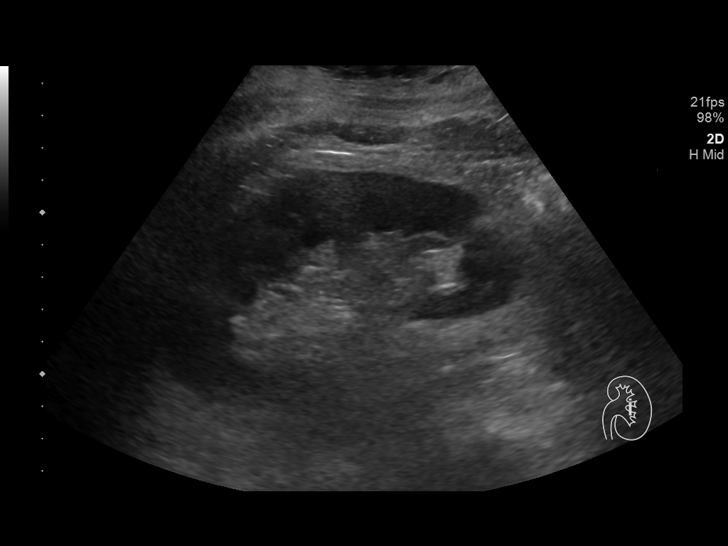
[im 37/55]
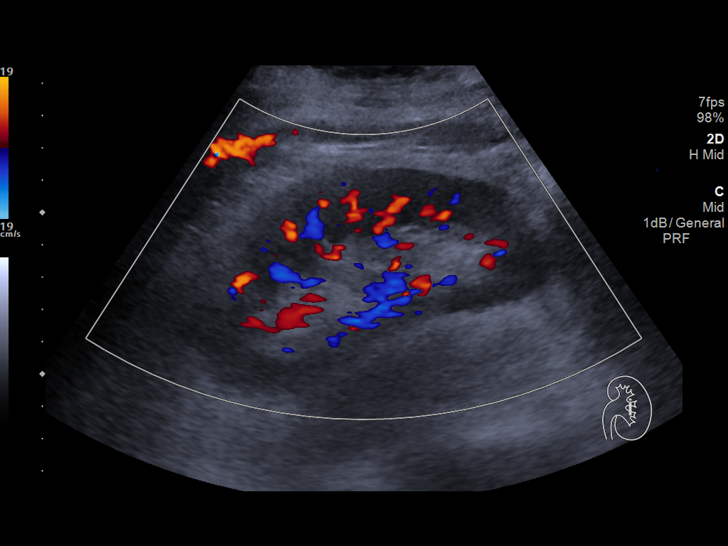
[im 41/55]
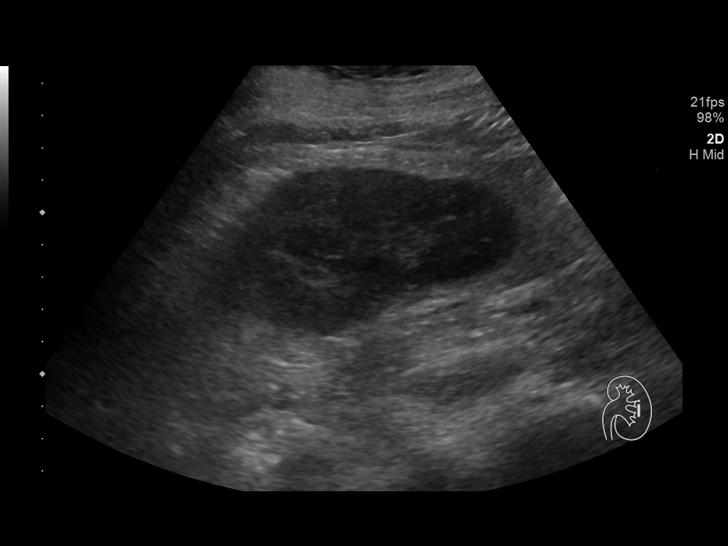
[im 46/55]
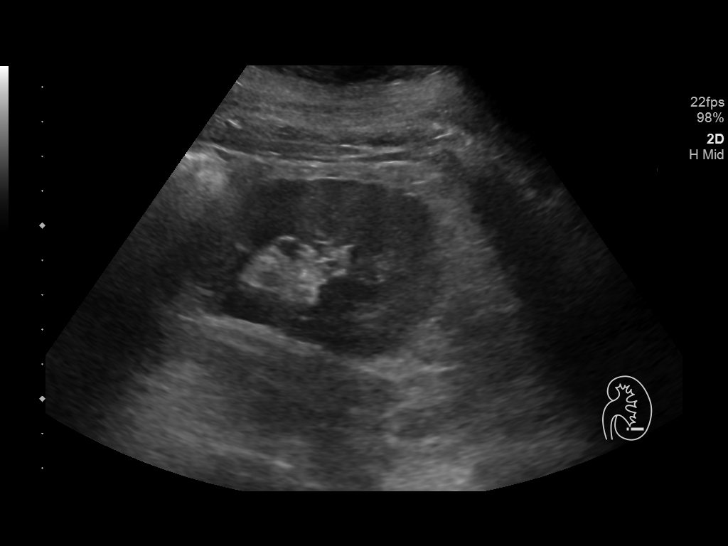
[im 50/55]
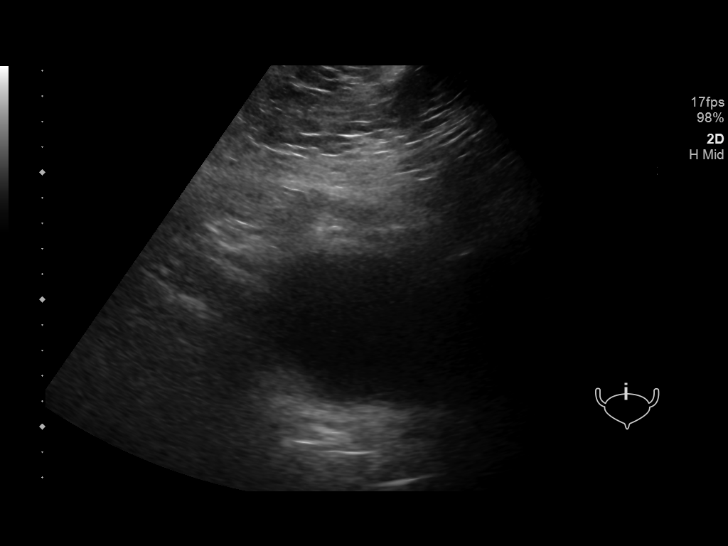
[im 55/55]
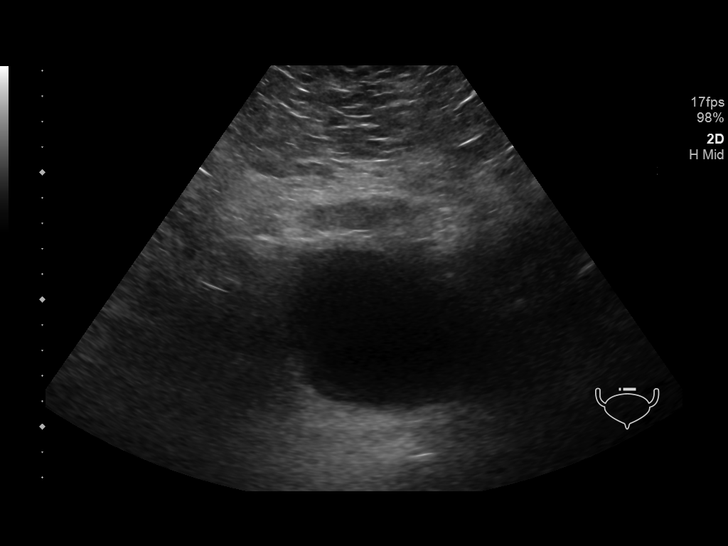

[14 of 25 positions shown; findings below may reference images not displayed]

FINDINGS: Right Kidney:

Length: 11.72 cm. Echogenicity within normal limits. No mass or
hydronephrosis visualized.

Left Kidney:

Length: 11.98 cm. Echogenicity within normal limits. No mass or
hydronephrosis visualized.

Bladder:

Appears normal for degree of bladder distention.
IMPRESSION: Negative exam.

## 2019-03-09 DIAGNOSIS — U071 COVID-19: Secondary | ICD-10-CM | POA: Diagnosis not present

## 2019-03-21 DIAGNOSIS — R1312 Dysphagia, oropharyngeal phase: Secondary | ICD-10-CM | POA: Diagnosis not present

## 2019-03-21 DIAGNOSIS — R278 Other lack of coordination: Secondary | ICD-10-CM | POA: Diagnosis not present

## 2019-03-21 DIAGNOSIS — R41841 Cognitive communication deficit: Secondary | ICD-10-CM | POA: Diagnosis not present

## 2019-03-21 DIAGNOSIS — R2681 Unsteadiness on feet: Secondary | ICD-10-CM | POA: Diagnosis not present

## 2019-03-21 DIAGNOSIS — R471 Dysarthria and anarthria: Secondary | ICD-10-CM | POA: Diagnosis not present

## 2019-03-21 DIAGNOSIS — R2689 Other abnormalities of gait and mobility: Secondary | ICD-10-CM | POA: Diagnosis not present

## 2019-03-21 DIAGNOSIS — Z9181 History of falling: Secondary | ICD-10-CM | POA: Diagnosis not present

## 2019-03-21 DIAGNOSIS — M6281 Muscle weakness (generalized): Secondary | ICD-10-CM | POA: Diagnosis not present

## 2019-03-21 DIAGNOSIS — R293 Abnormal posture: Secondary | ICD-10-CM | POA: Diagnosis not present

## 2019-03-21 DIAGNOSIS — G20 Parkinson's disease: Secondary | ICD-10-CM | POA: Diagnosis not present

## 2019-04-01 DIAGNOSIS — U071 COVID-19: Secondary | ICD-10-CM | POA: Diagnosis not present

## 2019-04-05 DIAGNOSIS — Z0189 Encounter for other specified special examinations: Secondary | ICD-10-CM | POA: Diagnosis not present

## 2019-04-05 DIAGNOSIS — R05 Cough: Secondary | ICD-10-CM | POA: Diagnosis not present

## 2019-04-05 DIAGNOSIS — F3489 Other specified persistent mood disorders: Secondary | ICD-10-CM | POA: Diagnosis not present

## 2019-04-05 DIAGNOSIS — K219 Gastro-esophageal reflux disease without esophagitis: Secondary | ICD-10-CM | POA: Diagnosis not present

## 2019-04-05 DIAGNOSIS — R532 Functional quadriplegia: Secondary | ICD-10-CM | POA: Diagnosis not present

## 2019-04-05 DIAGNOSIS — R3989 Other symptoms and signs involving the genitourinary system: Secondary | ICD-10-CM | POA: Diagnosis not present

## 2019-04-05 DIAGNOSIS — G20 Parkinson's disease: Secondary | ICD-10-CM | POA: Diagnosis not present

## 2019-04-07 DIAGNOSIS — U071 COVID-19: Secondary | ICD-10-CM | POA: Diagnosis not present

## 2019-04-18 DIAGNOSIS — M6281 Muscle weakness (generalized): Secondary | ICD-10-CM | POA: Diagnosis not present

## 2019-04-18 DIAGNOSIS — R296 Repeated falls: Secondary | ICD-10-CM | POA: Diagnosis not present

## 2019-04-18 DIAGNOSIS — R2681 Unsteadiness on feet: Secondary | ICD-10-CM | POA: Diagnosis not present

## 2019-04-18 DIAGNOSIS — G20 Parkinson's disease: Secondary | ICD-10-CM | POA: Diagnosis not present

## 2019-04-18 DIAGNOSIS — R278 Other lack of coordination: Secondary | ICD-10-CM | POA: Diagnosis not present

## 2019-04-18 DIAGNOSIS — Z9181 History of falling: Secondary | ICD-10-CM | POA: Diagnosis not present

## 2019-04-18 DIAGNOSIS — R41841 Cognitive communication deficit: Secondary | ICD-10-CM | POA: Diagnosis not present

## 2019-04-18 DIAGNOSIS — R293 Abnormal posture: Secondary | ICD-10-CM | POA: Diagnosis not present

## 2019-04-18 DIAGNOSIS — R2689 Other abnormalities of gait and mobility: Secondary | ICD-10-CM | POA: Diagnosis not present

## 2019-04-18 DIAGNOSIS — R1312 Dysphagia, oropharyngeal phase: Secondary | ICD-10-CM | POA: Diagnosis not present

## 2019-04-18 DIAGNOSIS — R471 Dysarthria and anarthria: Secondary | ICD-10-CM | POA: Diagnosis not present

## 2019-04-21 DIAGNOSIS — U071 COVID-19: Secondary | ICD-10-CM | POA: Diagnosis not present

## 2019-04-29 DIAGNOSIS — Z20822 Contact with and (suspected) exposure to covid-19: Secondary | ICD-10-CM | POA: Diagnosis not present

## 2019-05-05 DIAGNOSIS — U071 COVID-19: Secondary | ICD-10-CM | POA: Diagnosis not present

## 2019-05-19 DIAGNOSIS — R41841 Cognitive communication deficit: Secondary | ICD-10-CM | POA: Diagnosis not present

## 2019-05-19 DIAGNOSIS — G20 Parkinson's disease: Secondary | ICD-10-CM | POA: Diagnosis not present

## 2019-05-19 DIAGNOSIS — R293 Abnormal posture: Secondary | ICD-10-CM | POA: Diagnosis not present

## 2019-05-19 DIAGNOSIS — R2681 Unsteadiness on feet: Secondary | ICD-10-CM | POA: Diagnosis not present

## 2019-05-19 DIAGNOSIS — R278 Other lack of coordination: Secondary | ICD-10-CM | POA: Diagnosis not present

## 2019-05-19 DIAGNOSIS — Z9181 History of falling: Secondary | ICD-10-CM | POA: Diagnosis not present

## 2019-05-19 DIAGNOSIS — R1312 Dysphagia, oropharyngeal phase: Secondary | ICD-10-CM | POA: Diagnosis not present

## 2019-05-19 DIAGNOSIS — M6281 Muscle weakness (generalized): Secondary | ICD-10-CM | POA: Diagnosis not present

## 2019-05-19 DIAGNOSIS — R2689 Other abnormalities of gait and mobility: Secondary | ICD-10-CM | POA: Diagnosis not present

## 2019-05-19 DIAGNOSIS — R471 Dysarthria and anarthria: Secondary | ICD-10-CM | POA: Diagnosis not present

## 2019-05-31 DIAGNOSIS — G20 Parkinson's disease: Secondary | ICD-10-CM | POA: Diagnosis not present

## 2019-05-31 DIAGNOSIS — F3489 Other specified persistent mood disorders: Secondary | ICD-10-CM | POA: Diagnosis not present

## 2019-05-31 DIAGNOSIS — R05 Cough: Secondary | ICD-10-CM | POA: Diagnosis not present

## 2019-05-31 DIAGNOSIS — K219 Gastro-esophageal reflux disease without esophagitis: Secondary | ICD-10-CM | POA: Diagnosis not present

## 2019-06-06 DIAGNOSIS — Z20822 Contact with and (suspected) exposure to covid-19: Secondary | ICD-10-CM | POA: Diagnosis not present

## 2019-06-13 DIAGNOSIS — U071 COVID-19: Secondary | ICD-10-CM | POA: Diagnosis not present

## 2019-06-27 DIAGNOSIS — R110 Nausea: Secondary | ICD-10-CM | POA: Diagnosis not present

## 2019-06-27 DIAGNOSIS — R05 Cough: Secondary | ICD-10-CM | POA: Diagnosis not present

## 2019-06-27 DIAGNOSIS — R0689 Other abnormalities of breathing: Secondary | ICD-10-CM | POA: Diagnosis not present

## 2019-06-28 DIAGNOSIS — K5909 Other constipation: Secondary | ICD-10-CM | POA: Diagnosis not present

## 2019-06-28 DIAGNOSIS — Z7189 Other specified counseling: Secondary | ICD-10-CM | POA: Diagnosis not present

## 2019-06-28 DIAGNOSIS — R05 Cough: Secondary | ICD-10-CM | POA: Diagnosis not present

## 2019-07-01 DIAGNOSIS — K5909 Other constipation: Secondary | ICD-10-CM | POA: Diagnosis not present

## 2019-07-01 DIAGNOSIS — R110 Nausea: Secondary | ICD-10-CM | POA: Diagnosis not present

## 2019-07-05 DIAGNOSIS — Z7189 Other specified counseling: Secondary | ICD-10-CM | POA: Diagnosis not present

## 2019-07-05 DIAGNOSIS — R110 Nausea: Secondary | ICD-10-CM | POA: Diagnosis not present

## 2019-07-05 DIAGNOSIS — G20 Parkinson's disease: Secondary | ICD-10-CM | POA: Diagnosis not present

## 2019-07-07 DIAGNOSIS — K5909 Other constipation: Secondary | ICD-10-CM | POA: Diagnosis not present

## 2019-07-07 DIAGNOSIS — R110 Nausea: Secondary | ICD-10-CM | POA: Diagnosis not present

## 2019-07-08 DIAGNOSIS — Z0189 Encounter for other specified special examinations: Secondary | ICD-10-CM | POA: Diagnosis not present

## 2019-07-08 DIAGNOSIS — G20 Parkinson's disease: Secondary | ICD-10-CM | POA: Diagnosis not present

## 2019-07-08 DIAGNOSIS — K5909 Other constipation: Secondary | ICD-10-CM | POA: Diagnosis not present

## 2019-07-08 DIAGNOSIS — R110 Nausea: Secondary | ICD-10-CM | POA: Diagnosis not present

## 2019-07-10 DIAGNOSIS — R1111 Vomiting without nausea: Secondary | ICD-10-CM | POA: Diagnosis not present

## 2019-07-12 DIAGNOSIS — R110 Nausea: Secondary | ICD-10-CM | POA: Diagnosis not present

## 2019-07-12 DIAGNOSIS — K5909 Other constipation: Secondary | ICD-10-CM | POA: Diagnosis not present

## 2019-07-12 DIAGNOSIS — D6949 Other primary thrombocytopenia: Secondary | ICD-10-CM | POA: Diagnosis not present

## 2019-07-12 DIAGNOSIS — D6489 Other specified anemias: Secondary | ICD-10-CM | POA: Diagnosis not present

## 2019-07-15 DIAGNOSIS — R627 Adult failure to thrive: Secondary | ICD-10-CM | POA: Diagnosis not present

## 2019-07-15 DIAGNOSIS — R110 Nausea: Secondary | ICD-10-CM | POA: Diagnosis not present

## 2019-07-19 DIAGNOSIS — R110 Nausea: Secondary | ICD-10-CM | POA: Diagnosis not present

## 2019-07-19 DIAGNOSIS — G20 Parkinson's disease: Secondary | ICD-10-CM | POA: Diagnosis not present

## 2019-07-19 DIAGNOSIS — R627 Adult failure to thrive: Secondary | ICD-10-CM | POA: Diagnosis not present

## 2019-07-19 DIAGNOSIS — Z7189 Other specified counseling: Secondary | ICD-10-CM | POA: Diagnosis not present

## 2019-07-19 DIAGNOSIS — R05 Cough: Secondary | ICD-10-CM | POA: Diagnosis not present

## 2019-07-19 DIAGNOSIS — Z515 Encounter for palliative care: Secondary | ICD-10-CM | POA: Diagnosis not present

## 2019-07-25 DIAGNOSIS — G20 Parkinson's disease: Secondary | ICD-10-CM | POA: Diagnosis not present

## 2019-07-25 DIAGNOSIS — R110 Nausea: Secondary | ICD-10-CM | POA: Diagnosis not present

## 2019-07-25 DIAGNOSIS — K3184 Gastroparesis: Secondary | ICD-10-CM | POA: Diagnosis not present

## 2019-07-25 DIAGNOSIS — R627 Adult failure to thrive: Secondary | ICD-10-CM | POA: Diagnosis not present

## 2019-08-02 DIAGNOSIS — G8929 Other chronic pain: Secondary | ICD-10-CM | POA: Diagnosis not present

## 2019-08-02 DIAGNOSIS — Z9189 Other specified personal risk factors, not elsewhere classified: Secondary | ICD-10-CM | POA: Diagnosis not present

## 2019-08-02 DIAGNOSIS — K3184 Gastroparesis: Secondary | ICD-10-CM | POA: Diagnosis not present

## 2019-08-02 DIAGNOSIS — G20 Parkinson's disease: Secondary | ICD-10-CM | POA: Diagnosis not present

## 2019-08-02 DIAGNOSIS — Z515 Encounter for palliative care: Secondary | ICD-10-CM | POA: Diagnosis not present

## 2019-08-02 DIAGNOSIS — M5489 Other dorsalgia: Secondary | ICD-10-CM | POA: Diagnosis not present

## 2019-08-02 DIAGNOSIS — N39498 Other specified urinary incontinence: Secondary | ICD-10-CM | POA: Diagnosis not present

## 2019-08-03 DIAGNOSIS — R112 Nausea with vomiting, unspecified: Secondary | ICD-10-CM | POA: Diagnosis not present

## 2019-08-03 DIAGNOSIS — G20 Parkinson's disease: Secondary | ICD-10-CM | POA: Diagnosis not present

## 2019-08-03 DIAGNOSIS — Z9189 Other specified personal risk factors, not elsewhere classified: Secondary | ICD-10-CM | POA: Diagnosis not present

## 2019-08-03 DIAGNOSIS — K3184 Gastroparesis: Secondary | ICD-10-CM | POA: Diagnosis not present

## 2019-08-04 DIAGNOSIS — G20 Parkinson's disease: Secondary | ICD-10-CM | POA: Diagnosis not present

## 2019-08-04 DIAGNOSIS — R112 Nausea with vomiting, unspecified: Secondary | ICD-10-CM | POA: Diagnosis not present

## 2019-08-04 DIAGNOSIS — L539 Erythematous condition, unspecified: Secondary | ICD-10-CM | POA: Diagnosis not present

## 2019-08-04 DIAGNOSIS — G8929 Other chronic pain: Secondary | ICD-10-CM | POA: Diagnosis not present

## 2019-08-05 DIAGNOSIS — K3184 Gastroparesis: Secondary | ICD-10-CM | POA: Diagnosis not present

## 2019-08-05 DIAGNOSIS — G20 Parkinson's disease: Secondary | ICD-10-CM | POA: Diagnosis not present

## 2019-08-05 DIAGNOSIS — R112 Nausea with vomiting, unspecified: Secondary | ICD-10-CM | POA: Diagnosis not present

## 2019-08-05 DIAGNOSIS — G8929 Other chronic pain: Secondary | ICD-10-CM | POA: Diagnosis not present

## 2019-08-09 DIAGNOSIS — K3184 Gastroparesis: Secondary | ICD-10-CM | POA: Diagnosis not present

## 2019-08-09 DIAGNOSIS — Z515 Encounter for palliative care: Secondary | ICD-10-CM | POA: Diagnosis not present

## 2019-08-09 DIAGNOSIS — G20 Parkinson's disease: Secondary | ICD-10-CM | POA: Diagnosis not present

## 2019-08-18 DEATH — deceased

## 2020-03-13 ENCOUNTER — Telehealth: Payer: Self-pay | Admitting: Neurology

## 2020-03-13 NOTE — Telephone Encounter (Signed)
FYI-[name] wife called stating that the pt had passed away on 08/17/2019 but she is still receiving updates on Mychart.
